# Patient Record
Sex: Female | Born: 1970 | Race: White | Hispanic: No | Smoking: Current every day smoker
Health system: Southern US, Community
[De-identification: ages and names within clinical notes are randomized; demographics above are authoritative.]

## PROBLEM LIST (undated history)

## (undated) DIAGNOSIS — I10 Essential (primary) hypertension: Secondary | ICD-10-CM

## (undated) DIAGNOSIS — F1910 Other psychoactive substance abuse, uncomplicated: Secondary | ICD-10-CM

## (undated) DIAGNOSIS — K829 Disease of gallbladder, unspecified: Secondary | ICD-10-CM

## (undated) DIAGNOSIS — R350 Frequency of micturition: Principal | ICD-10-CM

## (undated) DIAGNOSIS — Z720 Tobacco use: Secondary | ICD-10-CM

## (undated) DIAGNOSIS — M549 Dorsalgia, unspecified: Secondary | ICD-10-CM

## (undated) DIAGNOSIS — F419 Anxiety disorder, unspecified: Secondary | ICD-10-CM

## (undated) DIAGNOSIS — D649 Anemia, unspecified: Secondary | ICD-10-CM

## (undated) DIAGNOSIS — K219 Gastro-esophageal reflux disease without esophagitis: Secondary | ICD-10-CM

## (undated) DIAGNOSIS — N390 Urinary tract infection, site not specified: Secondary | ICD-10-CM

## (undated) DIAGNOSIS — R319 Hematuria, unspecified: Secondary | ICD-10-CM

## (undated) DIAGNOSIS — F99 Mental disorder, not otherwise specified: Secondary | ICD-10-CM

## (undated) DIAGNOSIS — D071 Carcinoma in situ of vulva: Secondary | ICD-10-CM

## (undated) DIAGNOSIS — B1920 Unspecified viral hepatitis C without hepatic coma: Secondary | ICD-10-CM

## (undated) HISTORY — DX: Anemia, unspecified: D64.9

## (undated) HISTORY — DX: Urinary tract infection, site not specified: N39.0

## (undated) HISTORY — DX: Mental disorder, not otherwise specified: F99

## (undated) HISTORY — DX: Tobacco use: Z72.0

## (undated) HISTORY — DX: Hematuria, unspecified: R31.9

## (undated) HISTORY — DX: Carcinoma in situ of vulva: D07.1

## (undated) HISTORY — DX: Other psychoactive substance abuse, uncomplicated: F19.10

## (undated) HISTORY — DX: Gastro-esophageal reflux disease without esophagitis: K21.9

## (undated) HISTORY — DX: Frequency of micturition: R35.0

## (undated) HISTORY — DX: Anxiety disorder, unspecified: F41.9

## (undated) HISTORY — DX: Disease of gallbladder, unspecified: K82.9

## (undated) HISTORY — PX: TUBAL LIGATION: SHX77

---

## 2001-04-23 ENCOUNTER — Inpatient Hospital Stay (HOSPITAL_COMMUNITY): Admission: AD | Admit: 2001-04-23 | Discharge: 2001-04-24 | Payer: Self-pay | Admitting: Obstetrics and Gynecology

## 2002-11-26 ENCOUNTER — Emergency Department (HOSPITAL_COMMUNITY): Admission: EM | Admit: 2002-11-26 | Discharge: 2002-11-26 | Payer: Self-pay | Admitting: Emergency Medicine

## 2003-06-06 ENCOUNTER — Emergency Department (HOSPITAL_COMMUNITY): Admission: EM | Admit: 2003-06-06 | Discharge: 2003-06-06 | Payer: Self-pay | Admitting: *Deleted

## 2004-05-19 ENCOUNTER — Emergency Department (HOSPITAL_COMMUNITY): Admission: EM | Admit: 2004-05-19 | Discharge: 2004-05-19 | Payer: Self-pay | Admitting: Emergency Medicine

## 2004-05-23 ENCOUNTER — Ambulatory Visit: Payer: Self-pay | Admitting: Family Medicine

## 2004-07-10 ENCOUNTER — Emergency Department (HOSPITAL_COMMUNITY): Admission: EM | Admit: 2004-07-10 | Discharge: 2004-07-10 | Payer: Self-pay | Admitting: Emergency Medicine

## 2004-07-13 ENCOUNTER — Ambulatory Visit: Payer: Self-pay | Admitting: Family Medicine

## 2005-09-05 ENCOUNTER — Ambulatory Visit (HOSPITAL_COMMUNITY): Admission: RE | Admit: 2005-09-05 | Discharge: 2005-09-05 | Payer: Self-pay | Admitting: Family Medicine

## 2005-09-13 ENCOUNTER — Ambulatory Visit (HOSPITAL_COMMUNITY): Admission: RE | Admit: 2005-09-13 | Discharge: 2005-09-13 | Payer: Self-pay | Admitting: Family Medicine

## 2006-09-15 ENCOUNTER — Emergency Department (HOSPITAL_COMMUNITY): Admission: EM | Admit: 2006-09-15 | Discharge: 2006-09-15 | Payer: Self-pay | Admitting: Emergency Medicine

## 2006-10-10 ENCOUNTER — Encounter: Payer: Self-pay | Admitting: Obstetrics & Gynecology

## 2006-10-10 ENCOUNTER — Ambulatory Visit (HOSPITAL_COMMUNITY): Admission: RE | Admit: 2006-10-10 | Discharge: 2006-10-10 | Payer: Self-pay | Admitting: Obstetrics & Gynecology

## 2006-12-25 ENCOUNTER — Emergency Department (HOSPITAL_COMMUNITY): Admission: EM | Admit: 2006-12-25 | Discharge: 2006-12-25 | Payer: Self-pay | Admitting: Emergency Medicine

## 2007-01-28 ENCOUNTER — Emergency Department (HOSPITAL_COMMUNITY): Admission: EM | Admit: 2007-01-28 | Discharge: 2007-01-28 | Payer: Self-pay | Admitting: Emergency Medicine

## 2007-02-20 ENCOUNTER — Emergency Department (HOSPITAL_COMMUNITY): Admission: EM | Admit: 2007-02-20 | Discharge: 2007-02-20 | Payer: Self-pay | Admitting: Emergency Medicine

## 2007-11-28 ENCOUNTER — Emergency Department (HOSPITAL_COMMUNITY): Admission: EM | Admit: 2007-11-28 | Discharge: 2007-11-28 | Payer: Self-pay | Admitting: Emergency Medicine

## 2007-12-03 ENCOUNTER — Other Ambulatory Visit: Admission: RE | Admit: 2007-12-03 | Discharge: 2007-12-03 | Payer: Self-pay | Admitting: Obstetrics & Gynecology

## 2007-12-09 ENCOUNTER — Emergency Department (HOSPITAL_COMMUNITY): Admission: EM | Admit: 2007-12-09 | Discharge: 2007-12-09 | Payer: Self-pay | Admitting: Emergency Medicine

## 2008-05-27 ENCOUNTER — Inpatient Hospital Stay (HOSPITAL_COMMUNITY): Admission: RE | Admit: 2008-05-27 | Discharge: 2008-05-30 | Payer: Self-pay | Admitting: Obstetrics & Gynecology

## 2008-05-27 ENCOUNTER — Encounter: Payer: Self-pay | Admitting: Obstetrics & Gynecology

## 2008-06-12 ENCOUNTER — Emergency Department (HOSPITAL_COMMUNITY): Admission: EM | Admit: 2008-06-12 | Discharge: 2008-06-12 | Payer: Self-pay | Admitting: Emergency Medicine

## 2008-06-18 ENCOUNTER — Inpatient Hospital Stay (HOSPITAL_COMMUNITY): Admission: EM | Admit: 2008-06-18 | Discharge: 2008-06-23 | Payer: Self-pay | Admitting: Emergency Medicine

## 2008-06-22 ENCOUNTER — Encounter (INDEPENDENT_AMBULATORY_CARE_PROVIDER_SITE_OTHER): Payer: Self-pay | Admitting: General Surgery

## 2008-10-01 ENCOUNTER — Emergency Department (HOSPITAL_COMMUNITY): Admission: EM | Admit: 2008-10-01 | Discharge: 2008-10-01 | Payer: Self-pay | Admitting: Emergency Medicine

## 2009-03-01 ENCOUNTER — Emergency Department (HOSPITAL_COMMUNITY): Admission: EM | Admit: 2009-03-01 | Discharge: 2009-03-01 | Payer: Self-pay | Admitting: Emergency Medicine

## 2010-05-10 LAB — BASIC METABOLIC PANEL
BUN: 11 mg/dL (ref 6–23)
BUN: 11 mg/dL (ref 6–23)
CO2: 29 mEq/L (ref 19–32)
Calcium: 8.4 mg/dL (ref 8.4–10.5)
Chloride: 103 mEq/L (ref 96–112)
Chloride: 104 mEq/L (ref 96–112)
Creatinine, Ser: 0.59 mg/dL (ref 0.4–1.2)
Creatinine, Ser: 0.69 mg/dL (ref 0.4–1.2)
GFR calc Af Amer: >60 mL/min (ref >60)
GFR calc non Af Amer: >60 mL/min (ref >60)
GFR calc non Af Amer: >60 mL/min (ref >60)
GFR calc non Af Amer: >60 mL/min (ref >60)
Glucose, Bld: 107 mg/dL — ABNORMAL HIGH (ref 70–99)
Glucose, Bld: 85 mg/dL (ref 70–99)
Potassium: 4.8 mEq/L (ref 3.5–5.1)
Potassium: 4.9 mEq/L (ref 3.5–5.1)
Sodium: 137 mEq/L (ref 135–145)
Sodium: 139 mEq/L (ref 135–145)

## 2010-05-10 LAB — URINALYSIS, ROUTINE W REFLEX MICROSCOPIC
Bilirubin Urine: NEGATIVE
Glucose, UA: NEGATIVE mg/dL
Ketones, ur: >80 mg/dL — AB
Ketones, ur: NEGATIVE mg/dL
Leukocytes, UA: NEGATIVE
Nitrite: NEGATIVE
Protein, ur: NEGATIVE mg/dL
Protein, ur: >300 mg/dL — AB
Urobilinogen, UA: 0.2 mg/dL (ref 0.0–1.0)

## 2010-05-10 LAB — CBC
HCT: 28.4 % — ABNORMAL LOW (ref 36.0–46.0)
HCT: 30.8 % — ABNORMAL LOW (ref 36.0–46.0)
HCT: 33.5 % — ABNORMAL LOW (ref 36.0–46.0)
Hemoglobin: 10.2 g/dL — ABNORMAL LOW (ref 12.0–15.0)
Hemoglobin: 10.6 g/dL — ABNORMAL LOW (ref 12.0–15.0)
Hemoglobin: 12 g/dL (ref 12.0–15.0)
MCHC: 35.2 g/dL (ref 30.0–36.0)
MCV: 89.7 fL (ref 78.0–100.0)
MCV: 89.8 fL (ref 78.0–100.0)
Platelets: 239 10*3/uL (ref 150–400)
RBC: 3.16 MIL/uL — ABNORMAL LOW (ref 3.87–5.11)
RBC: 3.35 MIL/uL — ABNORMAL LOW (ref 3.87–5.11)
RDW: 14.6 % (ref 11.5–15.5)
RDW: 14.6 % (ref 11.5–15.5)
RDW: 15.2 % (ref 11.5–15.5)
WBC: 6.9 10*3/uL (ref 4.0–10.5)
WBC: 7.9 10*3/uL (ref 4.0–10.5)
WBC: 8.2 10*3/uL (ref 4.0–10.5)

## 2010-05-10 LAB — DIFFERENTIAL
Basophils Absolute: 0 10*3/uL (ref 0.0–0.1)
Basophils Absolute: 0 10*3/uL (ref 0.0–0.1)
Basophils Absolute: 0.1 10*3/uL (ref 0.0–0.1)
Basophils Absolute: 0.1 10*3/uL (ref 0.0–0.1)
Basophils Relative: 1 % (ref 0–1)
Basophils Relative: 1 % (ref 0–1)
Eosinophils Absolute: 0.3 10*3/uL (ref 0.0–0.7)
Eosinophils Absolute: 0.3 10*3/uL (ref 0.0–0.7)
Eosinophils Relative: 2 % (ref 0–5)
Eosinophils Relative: 4 % (ref 0–5)
Lymphocytes Relative: 23 % (ref 12–46)
Lymphs Abs: 1.8 10*3/uL (ref 0.7–4.0)
Lymphs Abs: 2.4 10*3/uL (ref 0.7–4.0)
Lymphs Abs: 2.4 10*3/uL (ref 0.7–4.0)
Monocytes Absolute: 1 10*3/uL (ref 0.1–1.0)
Monocytes Relative: 9 % (ref 3–12)
Neutro Abs: 4 10*3/uL (ref 1.7–7.7)
Neutro Abs: 4.7 10*3/uL (ref 1.7–7.7)
Neutrophils Relative %: 53 % (ref 43–77)
Neutrophils Relative %: 61 % (ref 43–77)
Neutrophils Relative %: 68 % (ref 43–77)

## 2010-05-10 LAB — COMPREHENSIVE METABOLIC PANEL
Alkaline Phosphatase: 660 U/L — ABNORMAL HIGH (ref 39–117)
BUN: 8 mg/dL (ref 6–23)
CO2: 25 mEq/L (ref 19–32)
Chloride: 99 mEq/L (ref 96–112)
Glucose, Bld: 102 mg/dL — ABNORMAL HIGH (ref 70–99)
Potassium: 3 mEq/L — ABNORMAL LOW (ref 3.5–5.1)
Total Bilirubin: 4.4 mg/dL — ABNORMAL HIGH (ref 0.3–1.2)

## 2010-05-10 LAB — HEPATIC FUNCTION PANEL
ALT: 81 U/L — ABNORMAL HIGH (ref 0–35)
AST: 20 U/L (ref 0–37)
Albumin: 2 g/dL — ABNORMAL LOW (ref 3.5–5.2)
Albumin: 2.2 g/dL — ABNORMAL LOW (ref 3.5–5.2)
Alkaline Phosphatase: 307 U/L — ABNORMAL HIGH (ref 39–117)
Alkaline Phosphatase: 392 U/L — ABNORMAL HIGH (ref 39–117)
Alkaline Phosphatase: 424 U/L — ABNORMAL HIGH (ref 39–117)
Bilirubin, Direct: 0.4 mg/dL — ABNORMAL HIGH (ref 0.0–0.3)
Bilirubin, Direct: 0.4 mg/dL — ABNORMAL HIGH (ref 0.0–0.3)
Indirect Bilirubin: 0.5 mg/dL (ref 0.3–0.9)
Indirect Bilirubin: 0.5 mg/dL (ref 0.3–0.9)
Total Bilirubin: 0.9 mg/dL (ref 0.3–1.2)
Total Bilirubin: 1 mg/dL (ref 0.3–1.2)
Total Protein: 5 g/dL — ABNORMAL LOW (ref 6.0–8.3)
Total Protein: 5.1 g/dL — ABNORMAL LOW (ref 6.0–8.3)

## 2010-05-10 LAB — AMYLASE: Amylase: 77 U/L (ref 27–131)

## 2010-05-10 LAB — CROSSMATCH: ABO/RH(D): O POS

## 2010-05-10 LAB — PREGNANCY, URINE: Preg Test, Ur: NEGATIVE

## 2010-05-10 LAB — URINE MICROSCOPIC-ADD ON

## 2010-05-10 LAB — LIPASE, BLOOD: Lipase: 28 U/L (ref 11–59)

## 2010-05-11 LAB — CBC
HCT: 28.1 % — ABNORMAL LOW (ref 36.0–46.0)
HCT: 33.5 % — ABNORMAL LOW (ref 36.0–46.0)
Hemoglobin: 10 g/dL — ABNORMAL LOW (ref 12.0–15.0)
Hemoglobin: 11.9 g/dL — ABNORMAL LOW (ref 12.0–15.0)
MCHC: 35.3 g/dL (ref 30.0–36.0)
MCHC: 35.5 g/dL (ref 30.0–36.0)
RBC: 2.95 MIL/uL — ABNORMAL LOW (ref 3.87–5.11)
RBC: 3.51 MIL/uL — ABNORMAL LOW (ref 3.87–5.11)
RDW: 13.9 % (ref 11.5–15.5)
RDW: 14.1 % (ref 11.5–15.5)

## 2010-06-14 NOTE — Op Note (Signed)
NAME:  Jamie Evans, Jamie Evans               ACCOUNT NO.:  [id number]   MEDICAL RECORD NO.:  [id number]          PATIENT TYPE:  INP   LOCATION:  A308                          FACILITY:  [facility]   PHYSICIAN:  Dalia Heading, M.D.  DATE OF BIRTH:  03/11/1970   DATE OF PROCEDURE:  06/22/2008  DATE OF DISCHARGE:                               OPERATIVE REPORT   PREOPERATIVE DIAGNOSES:  Cholecystitis and cholelithiasis.   POSTOPERATIVE DIAGNOSES:  Cholecystitis and cholelithiasis.   PROCEDURE:  Laparoscopic cholecystectomy.   SURGEON:  Dalia Heading, MD   ANESTHESIA:  General endotracheal.   INDICATIONS:  The patient is a 40 year old white female who presents  with cholecystitis secondary to cholelithiasis.  She is postpartum 3  weeks.  She was hypertensive throughout her pregnancy.  She did have  hyperbilirubinemia when she first presented, but this has since  resolved.  The patient now comes to the operating room for laparoscopic  cholecystectomy with a possible cholangiogram.  The risks and benefits  of the procedure including bleeding, infection, hepatobiliary injury,  and the possibility of an open procedure were fully explained to the  patient, gave informed consent.   PROCEDURE NOTE:  The patient was placed in the supine position.  After  induction of general endotracheal anesthesia, the abdomen was prepped  and draped using the usual sterile technique with Betadine.  Surgical  site confirmation was performed.   A supraumbilical incision was made down to the fascia.  Veress needle  was introduced into the abdominal cavity, and confirmation of placement  was done using the saline drop test.  The abdomen was then insufflated  to 16 mmHg pressure.  An 11-mm trocar was introduced into the abdominal  cavity under direct visualization without difficulty.  The patient was  placed in reverse Trendelenburg position.  An additional 1-mm trocar was  placed in the epigastric region.  A 5-mm  trocar was placed in the right  upper quadrant and right flank regions.  Liver was inspected and noted  to be within normal limits.  The gallbladder was retracted superior  laterally.  Dissection was begun around the infundibulum of the  gallbladder.  The cystic duct was first identified.  Its juncture to the  infundibulum fully identified.  A single EndoClip was placed proximally  on the cystic duct.  An incision was made into the cystic duct, and a  cholangiogram was attempted.  I was then able to thread the  cholangiocatheter into the cystic duct due to the cystic duct's small  caliber.  It was decided to abort the cholangiogram.  Multiple EndoClips  were placed distally on the cystic duct, and the cystic duct was  divided.  Cystic artery was likewise ligated and divided.  The  gallbladder was freed away from the gallbladder fossa using Bovie  electrocautery.  The gallbladder was delivered through the epigastric  trocar site using EndoCatch bag.  The gallbladder fossa was inspected.  No abnormal bleeding or bile leakage was noted.  Surgicel was placed in  the gallbladder fossa.  All fluid and air were then evacuated from the  abdominal cavity prior to removal of the trocars.   All wounds were irrigated with normal saline.  All wounds were injected  with 0.5% Sensorcaine.  The supraumbilical fascia as well as epigastric  fascia were reapproximated using 0 Vicryl interrupted sutures.  All skin  incisions were closed using staples.  Betadine ointment and dry sterile  dressings were applied.   All tape and needle counts were correct at the end of the procedure.  The patient was extubated in the operating room, went back to recovery  room awake in stable condition.   COMPLICATIONS:  None.   SPECIMEN:  Gallbladder.   BLOOD LOSS:  Minimal.      Dalia Heading, M.D.  Electronically Signed     MAJ/MEDQ  D:  06/22/2008  T:  06/23/2008  Job:  [id number]   cc:   Lia Hopping  Fax:  [phone]   Lazaro Arms, M.D.  Fax: [phone]

## 2010-06-14 NOTE — H&P (Signed)
NAME:  Jamie Evans, Jamie Evans               ACCOUNT NO.:  [id number]   MEDICAL RECORD NO.:  [id number]          PATIENT TYPE:  INP   LOCATION:  A228                          FACILITY:  [facility]   PHYSICIAN:  Dalia Heading, M.D.  DATE OF BIRTH:  07-02-1970   DATE OF ADMISSION:  06/17/2008  DATE OF DISCHARGE:  LH                              HISTORY & PHYSICAL   CHIEF COMPLAINT:  Right upper quadrant abdominal pain.   HISTORY OF PRESENT ILLNESS:  The patient is a 40 year old white female  who presented the emergency room with worsening right upper quadrant  abdominal pain.  She is status post a C-section 3 weeks ago by Dr. Despina Hidden.  She was afebrile, but her blood pressure was significantly elevated at  184/118.  Pulse was 112, respirations 26.  She underwent CT scan of the  abdomen and pelvis which revealed acute cholecystitis.  Her liver enzyme  tests were markedly elevated.   PAST MEDICAL HISTORY:  Includes hypertension, temporomandibular joint  disease.   SURGICAL HISTORY:  C-sections, dilatation and curettage, tubal ligation.   CURRENT MEDICATIONS:  Tylox and Xanax.   ALLERGIES:  PENICILLIN.   REVIEW OF SYSTEMS:  The patient does smoke.  She denies any illicit drug  abuse.  She socially drinks.   PHYSICAL EXAMINATION:  On physical examination, the patient is a well-  developed, well-nourished white female who was in moderate discomfort.  LUNGS:  Clear to auscultation with equal breath sounds bilaterally.  HEART EXAMINATION:  Reveals a regular rate and rhythm without S3, S4, or  murmurs.  ABDOMEN:  Is soft with tenderness noted in the right upper quadrant to  palpation.  No rigidity is noted.  No hepatosplenomegaly or masses are  noted.   White blood cell count 6.9, hemoglobin 12, hematocrit 33.5, platelet  count 246.  MET 7 is remarkable for a potassium of 3.  SGOT 326, SGPT  590, alkaline phosphatase 660, total bilirubin 4.4, lipase 13, albumin  2.7.  Calcium is 8.9.   IMPRESSION:   Cholecystitis, cholelithiasis, jaundice.   PLAN:  The patient is being admitted to hospital for pain control.  An  ultrasound of the gallbladder will be performed to further delineate the  gallbladder.  Further managements pending those results.  She will  subsequently need a cholecystectomy prior to discharge.  This has been  explained to the patient, and she agrees to the treatment plan.  Gastroenterology will be consulted if there is evidence of  choledocholithiasis.      Dalia Heading, M.D.  Electronically Signed     MAJ/MEDQ  D:  06/18/2008  T:  06/18/2008  Job:  [id number]   cc:   Dalia Heading, M.D.  Fax: [phone]   Lia Hopping  Fax: [phone]

## 2010-06-14 NOTE — Op Note (Signed)
NAME:  Jamie Evans, Jamie Evans               ACCOUNT NO.:  [id number]   MEDICAL RECORD NO.:  [id number]          PATIENT TYPE:  AMB   LOCATION:  DAY                           FACILITY:  [facility]   PHYSICIAN:  Lazaro Arms, M.D.   DATE OF BIRTH:  1970-09-23   DATE OF PROCEDURE:  10/10/2006  DATE OF DISCHARGE:                               OPERATIVE REPORT   PREOPERATIVE DIAGNOSIS:  Missed abortion at 9 weeks.   POSTOPERATIVE DIAGNOSIS:  Missed abortion at 9 weeks.   PROCEDURE:  Cervical dilation with suction and sharp uterine curettage.   SURGEON:  Duane Lope, MD.   ANESTHESIA:  General endotracheal anesthesia.   FINDINGS:  The patient seen in the office. Had about a 9-week  intrauterine pregnancy with no fetal cardiac activity. She desired to  have a D and C.  On exam today her uterus sounded to 10 cm and there was  appropriate amount of products of conception return.   DESCRIPTION OF OPERATION:  The patient was taken to the operating room  and placed in the supine position. Underwent general endotracheal  anesthesia, then prepped and draped in the usual sterile fashion. Her  cervix was grasped. Cervix dilated serially to allow passage of the 9  curved French suction curet. Several passes were made. A moderate of  tissue was returned. A general sharp curettage was performed without  difficulty. Had good uterine cry in all areas.   The patient had about 100 mL of blood loss. She received Methergine 0.2  mg IM, and was taken to the recovery room in good and stable condition;  all counts correct.      Lazaro Arms, M.D.  Electronically Signed     LHE/MEDQ  D:  10/10/2006  T:  10/10/2006  Job:  [id number]

## 2010-06-14 NOTE — Discharge Summary (Signed)
NAMEKELSEY, DURFLINGER               ACCOUNT NO.:  [id number]   MEDICAL RECORD NO.:  [id number]          PATIENT TYPE:  INP   LOCATION:  9124                          FACILITY:  [facility]   PHYSICIAN:  Tanya S. Shawnie Pons, M.D.   DATE OF BIRTH:  [birth year]   DATE OF ADMISSION:  05/27/2008  DATE OF DISCHARGE:  05/30/2008                               DISCHARGE SUMMARY   PREOPERATIVE DIAGNOSES:  1. Intrauterine pregnancy at 53 weeks' gestation.  2. Previous cesarean section x3.  3. Desires permanent sterilization.  4. Cocaine abuse, which the patient denies.   POSTOPERATIVE DIAGNOSES:  1. Intrauterine pregnancy at 54 weeks' gestation.  2. Previous cesarean section x3.  3. Desires permanent sterilization.  4. Cocaine abuse, which the patient denies.   PROCEDURE:  Repeat cesarean section low transverse and bilateral tubal  sterilization.   The surgeon who performed this repeat cesarean section was Dr. Lazaro Arms.  Please see the dictated operative note for details of the surgery  and did produce a viable female infant with Apgars of 9 and 9 and EBL  for the surgery of 500 mL.  The patient's admitting hemoglobin was 11.9,  hematocrit 33.5, white blood cell count 10.5, and platelets 249.  The  patient's postoperative and postpartum course had been uneventful.  On  postpartum day #1, the patient's hemoglobin was found to be 10.0,  hematocrit 28.1, white blood count 12.7, and platelets 178.  The urine  drug screen was performed secondary to the patient's history of multiple  positive urine drug screen during pregnancy, it was found to be  negative.  Meconium screen of the infant was also found to be negative.  Throughout the course of her hospital stay, the patient's vital signs  have been within normal limits.  Her exams have been within normal  limits.  The patient is being discharged on postoperative day #3 with no  complaints.  She is being discharged home in stable condition.  Social  work, CPS is to follow, and CPS will follow up on Monday secondary to  history of cocaine abuse and a positive drug screen that were found  during her pregnancy.  The patient is to follow up on Wednesday at  [facility] for staple removal and at 6 weeks for her postpartum visit.   Discharge prescriptions were given for Motrin 600 mg q.6 h. p.r.n.  cramping and Percocet 5/325 one-two tablets p.o. q.4-6 hours p.r.n.  pain.  The patient is instructed to continue her prenatal vitamins 1  p.o. daily for 6 weeks  postpartum.  Engorgement precautions had been reviewed with her  secondary to her preference of bottle feeding.  The patient is  encouraged to continue her nicotine patches and is instructed on how to  purchase over-the-counter patches and the strength needed for smoking  cessation.      Maylon Cos, C.N.M.      Shelbie Proctor. Shawnie Pons, M.D.  Electronically Signed    SS/MEDQ  D:  05/30/2008  T:  05/31/2008  Job:  [id number]

## 2010-06-14 NOTE — Op Note (Signed)
Jamie Evans, Jamie Evans               ACCOUNT NO.:  [id number]   MEDICAL RECORD NO.:  [id number]          PATIENT TYPE:  INP   LOCATION:  9124                          FACILITY:  [facility]   PHYSICIAN:  Lazaro Arms, M.D.   DATE OF BIRTH:  03-04-1970   DATE OF PROCEDURE:  05/27/2008  DATE OF DISCHARGE:                               OPERATIVE REPORT   PREOPERATIVE DIAGNOSES:  1. Intrauterine pregnancy at 39 weeks' gestation.  2. Previous cesarean section x3.  3. Desires sterilization.  4. History of four consecutive positive cocaine in urine, the patient      denies use.   POSTOPERATIVE DIAGNOSES:  1. Intrauterine pregnancy at 82 weeks' gestation.  2. Previous cesarean section x3.  3. Desires sterilization.  4. History of four consecutive positive cocaine in urine, the patient      denies use.   PROCEDURE:  Repeat cesarean section and tubal ligation.   SURGEON:  Lazaro Arms, MD   ANESTHESIA:  Spinal.   FINDINGS:  With low-transverse hysterotomy incision was delivered a  viable female at 75 with Apgars of 9 and 9, weighing 5 pounds 14  ounces.  There was three-vessel cord.  Cord blood and cord gases were  sent.  Placenta was normal, delivered spontaneously.   DESCRIPTION OF OPERATION:  The patient was taken to the operating room,  placed in the sitting position, underwent a spinal anesthetic.  She was  prepped and draped in the usual sterile fashion.  A Pfannenstiel skin  incision was made, carried down sharply to the rectus fascia, scored in  the midline and extended laterally.  The fascia was taken off the muscle  superiorly and inferiorly without difficulty.  The muscles were divided  and the peritoneal cavity was entered.  A bladder blade was placed.  The  vesicouterine serosal flap was created.  Low-transverse hysterotomy  incision was made over this incision and delivered a viable female  infant at 32 with Apgars of 9 and 9, weighing 5 pounds and 14 ounces.  Three-vessel cord.  Cord blood and cord gases were sent.  Placenta was  normal and intact.  The uterus was closed in 2 layers, first being the  running interlocking layer and the second being the imbricating layer.  Modified Pomeroy bilateral tubal ligation was performed.  Two segments  were removed bilaterally.  Interrupted figure-of-eights were placed for  additional hemostasis.  Uterus was replaced in peritoneal cavity.  Both  paracolic gutters were irrigated and pedicle was found to be hemostatic.  The muscles and peritoneum were  reapproximated loosely.  The fascia was closed using 0 Vicryl running.  Subcutaneous tissues were made hemostatic and irrigated.  The skin was  closed using skin staples.  The patient tolerated the procedure well.  She experienced 500 mL of blood loss and taken to the recovery room in  good and stable condition.  All counts were correct x3.      Lazaro Arms, M.D.  Electronically Signed     LHE/MEDQ  D:  05/27/2008  T:  05/28/2008  Job:  [id number]

## 2010-06-14 NOTE — Discharge Summary (Signed)
NAME:  Jamie Evans, Jamie Evans NO.:  [id number]   MEDICAL RECORD NO.:  [id number]          PATIENT TYPE:  INP   LOCATION:  A308                          FACILITY:  [facility]   PHYSICIAN:  Dalia Heading, M.D.  DATE OF BIRTH:  May 31, 1970   DATE OF ADMISSION:  06/17/2008  DATE OF DISCHARGE:  05/25/2010LH                               DISCHARGE SUMMARY   HOSPITAL COURSE SUMMARY:  The patient is a 40 year old white female who  is 3 weeks postpartum, who presented to [facility] with  abdominal pain, nausea, and vomiting.  She was found to have acute  cholecystitis.  She was admitted for further management and treatment.  On admission, her SGOT was 326, SGPT 590, alkaline phosphatase 660, and  total bilirubin 4.4.  Her liver enzyme tests subsequently improved.  She  did have an ultrasound of the gallbladder revealed cholelithiasis and  cholecystitis.  The common bile duct was 8-mm, but no  choledocholithiasis was seen.  She was also noted to have significant  hypertension, which was treated preoperatively.  She subsequently went  to the operating room on Jun 22, 2008, and underwent laparoscopic  cholecystectomy.  I attempted a cholangiogram, but the cystic duct was  very narrow.  Her total bilirubin had returned to normal prior to  surgery.  She tolerated the surgery well.  Postoperative course has been  unremarkable.  Diet was advanced without difficulty.   The patient is being discharged home on Jun 23, 2008, in good improving  condition.   DISCHARGE INSTRUCTIONS:  The patient is to follow up with Dr. Franky Macho on June 30, 2008 and Dr. Olena Leatherwood in 2 weeks.   DISCHARGE MEDICATIONS:  1. Percocet 7.5/325 mg 1-2 tablets p.o. q.4 h. p.r.n. pain.  2. Clonidine 0.2 mg p.o. b.i.d.  3. Metoprolol 25 mg p.o. b.i.d.   PRINCIPAL DIAGNOSES:  1. Cholecystitis, cholelithiasis.  2. Hypertension.   PRINCIPAL PROCEDURE:  Laparoscopic cholecystectomy on Jun 22, 2008.      Dalia Heading, M.D.  Electronically Signed     MAJ/MEDQ  D:  06/23/2008  T:  06/23/2008  Job:  [id number]   cc:   Lazaro Arms, M.D.  Fax: [phone]   Lia Hopping  Fax: [phone]

## 2010-06-17 NOTE — H&P (Signed)
[facility]  Patient:    Jamie, Jamie Evans Visit Number: [id number] MRN: [id number]          Service Type: OBS Location: LDR [id number] 01 Attending Physician:  Tilda Burrow Dictated by:   Duane Lope, M.D. Admit Date:  04/23/2001 Discharge Date: 04/24/2001                           History and Physical  DATE OF BIRTH: [birth year].  SOCIAL SECURITY NUMBER:  [id number].  HISTORY OF PRESENT ILLNESS:  The patient is a 40 year old white female, gravida 4, para 3, abortus 0, with an estimated date of delivery by a sonogram done two weeks ago of September 20, 2001, given her an estimated gestational age of 18-2/7th weeks gestation.  At the patients initial visit, which was on April 08, 2001 and the day of her sonogram, she was measuring two weeks smaller than her last menstrual period date and had laboratory data drawn that day.  She was found to have a positive drug screen for cocaine, marijuana and benzodiazepines and had a MSAFP which was significantly elevated.  Attempts were made to contact the patient to bring her back in for her elevated AFP and we got her back into the office on April 22, 2001; at which time, a sonogram revealed a nonviable fetus found initially by Zerita Boers, N.M. who asked Cyril Mourning to come who was the ultrasonographer as well as myself and we both confirmed that there was no cardiac activity.  In addition, there is diminished fluid, some elongation and probably overlapping of the sutures of the cranium as well which would point that the fetus became nonviable some time ago, at least a week.  The patient is informed of this and she is admitted to the hospital for the management of a fetal demise at [redacted] weeks gestation.  She is admitted for prostaglandin induction of labor and placement of laminaria.  PAST MEDICAL HISTORY:  Significant for drug abuse.  PAST SURGICAL HISTORY:  Three cesarean sections.  PAST OBSTETRICAL  HISTORY:  Three cesarean section in 1992, 1993, and 1996.  ALLERGIES:  None.  MEDICATIONS:  Prenatal vitamins and iron.  SOCIAL HISTORY:  The patient is single and unemployed.  FAMILY HISTORY:  Significant for heart problems and hypertension.  LABORATORY DATA:  Besides the significant risk for open neural tube defect with maternal serum alphafetoprotein, there is of course a positive urine drug screen.  She has a blood type of O positive.  Rubella is immuned.  RPR is nonreactive.  HIV is negative.  Pap smear had not yet been performed.  PHYSICAL EXAMINATION:  VITAL SIGNS:  Her weight is 153 pounds and her blood pressure is 110/70.  NECK:  Unremarkable.  Normal thyroid.  LUNGS:  Clear.  HEART:  Regular rate and rhythm with no murmur, regurgitation, or gallop.  BREASTS:  Deferred.  ABDOMEN:  Benign.  PELVIC:  Uterus is palpated three fingerbreadths under the umbilicus.  Pelvic exam was not performed.  EXTREMITIES:  Warm.  No edema.  NEUROLOGICAL:  Grossly intact.  IMPRESSION: 1. Intrauterine pregnancy at 18-2/7th weeks gestation by 16-week sonogram. 2. Fetal demise, probably for at least a week. 3. Elevated maternal serum alphafetoprotein. 4. Drug abuse.  PLAN:  While the patient denies any recent history of drug abuse she says at least one month, we will check a urine drug screen.  Effect delivery with a laminaria and prostaglandin.  Pathology will be performed on the fetus and hopefully chromosomal analysis as well.  The patient understands the indications for all of this.  She understands the clinical situation and we will proceed with laminaria and prostaglandin on April 23, 2001. Dictated by:   Duane Lope, M.D. Attending Physician:  Tilda Burrow DD:  04/22/01 TD:  04/22/01 Job: [id number]

## 2010-06-17 NOTE — Discharge Summary (Signed)
[facility]  Patient:    Jamie Evans, Jamie Evans Visit Number: [id number] MRN: [id number]          Service Type: OBS Location: LDR [id number] 01 Attending Physician:  Tilda Burrow Dictated by:   Christin Bach, M.D. Admit Date:  04/23/2001 Discharge Date: 04/24/2001                             Discharge Summary  ADMITTING DIAGNOSES: 1. Intrauterine pregnancy 18 weeks 2 days. 2. Fetal demise in utero. 3. History of elevated maternal serum alpha fetoprotein (MSAFP). 2. History of drug abuse this pregnancy.  DISCHARGE DIAGNOSES: 1. Intrauterine pregnancy 18 weeks. 2. Fetal demise in utero.  PROCEDURE: 1. Prostaglandin induction of labor on April 23, 2001 2. April 24, 2001, delivery of stillborn fetus and placenta.  HISTORY OF PRESENT ILLNESS:   This 40 year old female gravida 4, para 3, AB 0 at [redacted] weeks gestation after a pregnancy followed briefly in our office after initial visit April 08, 2001.  She was found to have size less than dates.  By corrected EDC she was 18-1/2 weeks at the time of admission.  She was seen for regular visits and found to have absence of fetal heart motion on examination April 22, 2001.  The patient was admitted on April 23, 2001, for delivery of stillborn fetus.  Since the patient is less than [redacted] weeks gestation it is considered a secondary trimester miscarriage rather than a stillborn.  HOSPITAL COURSE:  The patient was admitted as described in the admitting history by Dr. Despina Hidden from April 22, 2001.  The patient had laminaria inserted at 9 a.m. on April 23, 2001.  They were left in until 2 p.m. on which they were removed having dilated cervix nicely.  Prostin, prostaglandin F2 alpha 20 mg vaginal suppositories were placed per vagina every 3 hours until 11 p.m. Contractions developed and she proceeded to pass the stillborn fetus at 2:15 a.m.  The placenta was retained for approximately 5 hours and then delivered intact  spontaneously at 7:10 a.m.  Blood type was 0 positive.  The patient chose to go home on the afternoon of April 24, 2001, for follow up 1 week later in our office.  The patient showed appropriate breathing with regards to the pregnancy loss but did not wish to see the fetus after delivery. Additionally the patients urine drug screen was negative.  Follow up will be in our office in 1 to 2 weeks. Dictated by:   Christin Bach, M.D. Attending Physician:  Tilda Burrow DD:  05/22/01 TD:  05/22/01 Job: [id number]

## 2010-06-17 NOTE — Op Note (Signed)
[facility]  Patient:    Jamie Evans, Jamie Evans Visit Number: [id number] MRN: [id number]          Service Type: OBS Location: LDR HYQ6 01 Attending Physician:  Tilda Burrow Dictated by:   Christin Bach, M.D. Admit Date:  04/23/2001 Discharge Date: 04/24/2001                             Operative Report                         DELIVERY NOTE  DELIVERY TIME:  2:15 a.m. of fetus.  Delivery of placenta 7:10 a.m.  PHYSICIAN:  Christin Bach, M.D.  LABOR SUMMARY AND DELIVERY NOTE:  The patient was admitted early in the morning of April 23, 2001 with two laminaria placed inside the cervix and allowed to stay for 4-5 hours.  They are removed at 2 p.m. and Prostin E2 20 mg suppository was initiated on a 3-hour basis.  These were used multiple times until the patient finally began to have contractions in the late afternoon.  She progressed to have strong labor requiring analgesics as we approached midnight and delivered the fetus at approximately 2:15, delivering a stillborn infant with obvious maceration. The cord was extremely tiny and appeared twisted.  The patient continued to contract through the night but did not expel the placenta until the following morning when ring forceps could be used to gently place traction on the placenta and combined with downward pressure on the perineum and we were able to extract the placenta as an intact single specimen.  The patient had estimated blood loss of 250-300 cc with this entire process. She is noted to have blood type O positive and she will be observed for 6 hours and discharged home. Dictated by:   Christin Bach, M.D. Attending Physician:  Tilda Burrow DD:  04/24/01 TD:  04/25/01 Job: [id number]

## 2010-10-20 LAB — URINALYSIS, ROUTINE W REFLEX MICROSCOPIC
Glucose, UA: NEGATIVE
Hgb urine dipstick: NEGATIVE
Protein, ur: NEGATIVE
pH: 5.5

## 2010-11-11 LAB — DIFFERENTIAL
Basophils Absolute: 0.1
Basophils Relative: 1
Lymphocytes Relative: 20
Monocytes Absolute: 0.8 — ABNORMAL HIGH
Neutro Abs: 6.6
Neutrophils Relative %: 69

## 2010-11-11 LAB — COMPREHENSIVE METABOLIC PANEL
Alkaline Phosphatase: 43
BUN: 5 — ABNORMAL LOW
CO2: 26
Chloride: 101
Creatinine, Ser: 0.57
GFR calc non Af Amer: >60
Glucose, Bld: 92
Potassium: 4
Total Bilirubin: 0.7

## 2010-11-11 LAB — RH IMMUNE GLOB WKUP(>/=20WKS)(NOT WOMEN'S HOSP)

## 2010-11-11 LAB — CBC
HCT: 40.2
Hemoglobin: 14
MCV: 93.6
Platelets: 276
WBC: 9.6

## 2011-11-07 DIAGNOSIS — R079 Chest pain, unspecified: Secondary | ICD-10-CM

## 2013-02-11 ENCOUNTER — Encounter (INDEPENDENT_AMBULATORY_CARE_PROVIDER_SITE_OTHER): Payer: Self-pay

## 2013-02-11 ENCOUNTER — Ambulatory Visit (INDEPENDENT_AMBULATORY_CARE_PROVIDER_SITE_OTHER): Payer: Medicaid Other | Admitting: Adult Health

## 2013-02-11 ENCOUNTER — Encounter: Payer: Self-pay | Admitting: Adult Health

## 2013-02-11 ENCOUNTER — Other Ambulatory Visit (HOSPITAL_COMMUNITY)
Admission: RE | Admit: 2013-02-11 | Discharge: 2013-02-11 | Disposition: A | Discharge status: Home / Self Care | Payer: Medicaid Other | Source: Ambulatory Visit | Attending: Adult Health | Admitting: Adult Health

## 2013-02-11 VITALS — BP 110/70 | HR 80 | Ht 62.5 in | Wt 150.0 lb

## 2013-02-11 DIAGNOSIS — Z01419 Encounter for gynecological examination (general) (routine) without abnormal findings: Secondary | ICD-10-CM | POA: Insufficient documentation

## 2013-02-11 DIAGNOSIS — Z1151 Encounter for screening for human papillomavirus (HPV): Secondary | ICD-10-CM | POA: Insufficient documentation

## 2013-02-11 DIAGNOSIS — F419 Anxiety disorder, unspecified: Secondary | ICD-10-CM

## 2013-02-11 DIAGNOSIS — Z0000 Encounter for general adult medical examination without abnormal findings: Secondary | ICD-10-CM

## 2013-02-11 HISTORY — DX: Anxiety disorder, unspecified: F41.9

## 2013-02-11 MED ORDER — ALPRAZOLAM 1 MG PO TABS: 1.0000 mg | ORAL_TABLET | Freq: Two times a day (BID) | ORAL | Status: DC | PRN

## 2013-02-11 MED ORDER — ESCITALOPRAM OXALATE 10 MG PO TABS: 10.0000 mg | ORAL_TABLET | Freq: Every day | ORAL | Status: DC

## 2013-02-11 NOTE — Patient Instructions (Signed)
Generalized Anxiety Disorder Generalized anxiety disorder (GAD) is a mental disorder. It interferes with life functions, including relationships, work, and school. GAD is different from normal anxiety, which everyone experiences at some point in their lives in response to specific life events and activities. Normal anxiety actually helps us prepare for and get through these life events and activities. Normal anxiety goes away after the event or activity is over.  GAD causes anxiety that is not necessarily related to specific events or activities. It also causes excess anxiety in proportion to specific events or activities. The anxiety associated with GAD is also difficult to control. GAD can vary from mild to severe. People with severe GAD can have intense waves of anxiety with physical symptoms (panic attacks).  SYMPTOMS The anxiety and worry associated with GAD are difficult to control. This anxiety and worry are related to many life events and activities and also occur more days than not for 6 months or longer. People with GAD also have three or more of the following symptoms (one or more in children):  Restlessness.   Fatigue.  Difficulty concentrating.   Irritability.  Muscle tension.  Difficulty sleeping or unsatisfying sleep. DIAGNOSIS GAD is diagnosed through an assessment by your caregiver. Your caregiver will ask you questions aboutyour mood,physical symptoms, and events in your life. Your caregiver may ask you about your medical history and use of alcohol or drugs, including prescription medications. Your caregiver may also do a physical exam and blood tests. Certain medical conditions and the use of certain substances can cause symptoms similar to those associated with GAD. Your caregiver may refer you to a mental health specialist for further evaluation. TREATMENT The following therapies are usually used to treat GAD:   Medication Antidepressant medication usually is  prescribed for long-term daily control. Antianxiety medications may be added in severe cases, especially when panic attacks occur.   Talk therapy (psychotherapy) Certain types of talk therapy can be helpful in treating GAD by providing support, education, and guidance. A form of talk therapy called cognitive behavioral therapy can teach you healthy ways to think about and react to daily life events and activities.  Stress managementtechniques These include yoga, meditation, and exercise and can be very helpful when they are practiced regularly. A mental health specialist can help determine which treatment is best for you. Some people see improvement with one therapy. However, other people require a combination of therapies. Document Released: 05/13/2012 Document Reviewed: 05/13/2012 [facility] Patient Information 2014 [place]ExitCare, [place]. Take xanax and lexapro [facility] Follow up in 4 weeks Physical in 1 year Get mammogram [phone]
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
## 2013-02-12 ENCOUNTER — Other Ambulatory Visit: Payer: Self-pay | Admitting: Adult Health

## 2013-02-12 DIAGNOSIS — Z139 Encounter for screening, unspecified: Secondary | ICD-10-CM

## 2013-02-17 ENCOUNTER — Ambulatory Visit (HOSPITAL_COMMUNITY): Payer: Medicaid Other

## 2013-03-07 ENCOUNTER — Ambulatory Visit (HOSPITAL_COMMUNITY)
Admission: RE | Admit: 2013-03-07 | Discharge: 2013-03-07 | Disposition: A | Discharge status: Home / Self Care | Payer: Medicaid Other | Source: Ambulatory Visit | Attending: Adult Health | Admitting: Adult Health

## 2013-03-07 DIAGNOSIS — Z139 Encounter for screening, unspecified: Secondary | ICD-10-CM

## 2013-03-07 DIAGNOSIS — Z1231 Encounter for screening mammogram for malignant neoplasm of breast: Secondary | ICD-10-CM | POA: Insufficient documentation

## 2013-03-11 ENCOUNTER — Ambulatory Visit (INDEPENDENT_AMBULATORY_CARE_PROVIDER_SITE_OTHER): Payer: Medicaid Other | Admitting: Adult Health

## 2013-03-11 ENCOUNTER — Encounter: Payer: Self-pay | Admitting: Adult Health

## 2013-03-11 VITALS — BP 110/76 | Ht 64.0 in | Wt 154.0 lb

## 2013-03-11 DIAGNOSIS — F411 Generalized anxiety disorder: Secondary | ICD-10-CM

## 2013-03-11 DIAGNOSIS — F419 Anxiety disorder, unspecified: Secondary | ICD-10-CM

## 2013-03-11 NOTE — Patient Instructions (Signed)
Physical in 1 year Follow up prn

## 2013-03-11 NOTE — Progress Notes (Signed)
Subjective:     Patient ID: Pryor Montesobin L Delano, female   DOB: 03/04/1970, 43 y.o.   MRN: [id number]  HPI Zella BallRobin is back in follow up of starting lexapro and xanax for anxiety secondary to family stress.She is better and [facility] and [facility] daughter.  Review of Systems See HPI Reviewed past medical,surgical, social and family history. Reviewed medications and allergies.     Objective:   Physical Exam BP 110/76  Ht 5\' 4"  (1.626 m)  Wt 154 lb (69.854 kg)  BMI 26.42 kg/m2  LMP 02/21/2013   talk only, doing much better, can actually smile today, [facility] increased lexapro to 20 mg and is now filling her xanax.  Assessment:     Anxiety    Plan:     [facility]n

## 2013-04-14 ENCOUNTER — Emergency Department (HOSPITAL_COMMUNITY)
Admission: EM | Admit: 2013-04-14 | Discharge: 2013-04-14 | Disposition: A | Discharge status: Home / Self Care | Payer: Medicaid Other | Attending: Emergency Medicine | Admitting: Emergency Medicine

## 2013-04-14 ENCOUNTER — Emergency Department (HOSPITAL_COMMUNITY): Payer: Medicaid Other

## 2013-04-14 ENCOUNTER — Encounter (HOSPITAL_COMMUNITY): Payer: Self-pay | Admitting: Emergency Medicine

## 2013-04-14 DIAGNOSIS — S6000XA Contusion of unspecified finger without damage to nail, initial encounter: Secondary | ICD-10-CM

## 2013-04-14 DIAGNOSIS — F489 Nonpsychotic mental disorder, unspecified: Secondary | ICD-10-CM | POA: Insufficient documentation

## 2013-04-14 DIAGNOSIS — W208XXA Other cause of strike by thrown, projected or falling object, initial encounter: Secondary | ICD-10-CM | POA: Insufficient documentation

## 2013-04-14 DIAGNOSIS — Z8719 Personal history of other diseases of the digestive system: Secondary | ICD-10-CM | POA: Insufficient documentation

## 2013-04-14 DIAGNOSIS — Z880 Allergy status to penicillin: Secondary | ICD-10-CM | POA: Insufficient documentation

## 2013-04-14 DIAGNOSIS — Y939 Activity, unspecified: Secondary | ICD-10-CM | POA: Insufficient documentation

## 2013-04-14 DIAGNOSIS — Z79899 Other long term (current) drug therapy: Secondary | ICD-10-CM | POA: Insufficient documentation

## 2013-04-14 DIAGNOSIS — Y929 Unspecified place or not applicable: Secondary | ICD-10-CM | POA: Insufficient documentation

## 2013-04-14 DIAGNOSIS — F411 Generalized anxiety disorder: Secondary | ICD-10-CM | POA: Insufficient documentation

## 2013-04-14 DIAGNOSIS — F17200 Nicotine dependence, unspecified, uncomplicated: Secondary | ICD-10-CM | POA: Insufficient documentation

## 2013-04-14 MED ORDER — BACITRACIN ZINC 500 UNIT/GM EX OINT
TOPICAL_OINTMENT | CUTANEOUS | Status: AC
  Administered 2013-04-14: 18:00:00
  Filled 2013-04-14: qty 0.9

## 2013-04-14 NOTE — ED Notes (Signed)
Pt reports that she developed right middle finger pain after she dropped the toaster on her hand.  Pt has 1cm laceration on her finger, bleeding controlled. +CMS.

## 2013-04-14 NOTE — ED Notes (Signed)
Lac to rt middle finger, dropped toaster on finger, lac present.

## 2013-04-14 NOTE — Discharge Instructions (Signed)
Hand Contusion A hand contusion is a deep bruise on your hand area. Contusions are the result of an injury that caused bleeding under the skin. The contusion may turn blue, purple, or yellow. Minor injuries will give you a painless contusion, but more severe contusions may stay painful and swollen for a few weeks. CAUSES  A contusion is usually caused by a blow, trauma, or direct force to an area of the body. SYMPTOMS   Swelling and redness of the injured area.  Discoloration of the injured area.  Tenderness and soreness of the injured area.  Pain. DIAGNOSIS  The diagnosis can be made by taking a history and performing a physical exam. An X-ray, CT scan, or MRI may be needed to determine if there were any associated injuries, such as broken bones (fractures). TREATMENT  Often, the best treatment for a hand contusion is resting, elevating, icing, and applying cold compresses to the injured area. Over-the-counter medicines may also be recommended for pain control. HOME CARE INSTRUCTIONS   Put ice on the injured area.  Put ice in a plastic bag.  Place a towel between your skin and the bag.  Leave the ice on for 15-20 minutes, 03-04 times a day.  Only take over-the-counter or prescription medicines as directed by your caregiver. Your caregiver may recommend avoiding anti-inflammatory medicines (aspirin, ibuprofen, and naproxen) for 48 hours because these medicines may increase bruising.  If told, use an elastic wrap as directed. This can help reduce swelling. You may remove the wrap for sleeping, showering, and bathing. If your fingers become numb, cold, or blue, take the wrap off and reapply it more loosely.  Elevate your hand with pillows to reduce swelling.  Avoid overusing your hand if it is painful. SEEK IMMEDIATE MEDICAL CARE IF:   You have increased redness, swelling, or pain in your hand.  Your swelling or pain is not relieved with medicines.  You have loss of feeling in  your hand or are unable to move your fingers.  Your hand turns cold or blue.  You have pain when you move your fingers.  Your hand becomes warm to the touch.  Your contusion does not improve in 2 days. MAKE SURE YOU:   Understand these instructions.  Will watch your condition.  Will get help right away if you are not doing well or get worse. Document Released: 07/08/2001 Document Revised: 10/11/2011 Document Reviewed: 07/10/2011 [facility] Patient Information 2014 [place], [place]LL[place].  Take ibuprofen for discomfort Wear finger splint Elevate and use use ice packs to minimize swelling
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
## 2013-04-19 NOTE — ED Provider Notes (Signed)
Medical screening examination/treatment/procedure(s) were performed by non-physician practitioner and as supervising physician I was immediately available for consultation/collaboration.   EKG Interpretation None       Eain Mullendore, MD 04/19/13 0747 

## 2013-07-25 ENCOUNTER — Other Ambulatory Visit: Payer: Self-pay | Admitting: Neurology

## 2013-07-25 DIAGNOSIS — M545 Low back pain: Secondary | ICD-10-CM

## 2013-08-07 ENCOUNTER — Ambulatory Visit (HOSPITAL_COMMUNITY)
Admission: RE | Admit: 2013-08-07 | Discharge: 2013-08-07 | Disposition: A | Discharge status: Home / Self Care | Payer: Medicaid Other | Source: Ambulatory Visit | Attending: Neurology | Admitting: Neurology

## 2013-08-07 DIAGNOSIS — M545 Low back pain, unspecified: Secondary | ICD-10-CM | POA: Insufficient documentation

## 2013-09-10 ENCOUNTER — Telehealth: Payer: Self-pay | Admitting: Adult Health

## 2013-09-10 NOTE — Telephone Encounter (Signed)
Spoke with pt. Pt is having abnormal periods and wanted something called in. I advised she would need an appt to discuss. Pt voiced understanding. Call transferred to front desk for appt. JSY

## 2013-09-15 ENCOUNTER — Ambulatory Visit: Payer: Medicaid Other | Admitting: Adult Health

## 2013-12-01 ENCOUNTER — Encounter (HOSPITAL_COMMUNITY): Payer: Self-pay | Admitting: Emergency Medicine

## 2014-05-19 ENCOUNTER — Ambulatory Visit (INDEPENDENT_AMBULATORY_CARE_PROVIDER_SITE_OTHER): Payer: Medicaid Other | Admitting: Adult Health

## 2014-05-19 ENCOUNTER — Encounter: Payer: Self-pay | Admitting: Adult Health

## 2014-05-19 VITALS — BP 138/98 | HR 84 | Ht 64.0 in | Wt 164.0 lb

## 2014-05-19 DIAGNOSIS — N390 Urinary tract infection, site not specified: Secondary | ICD-10-CM

## 2014-05-19 DIAGNOSIS — R350 Frequency of micturition: Secondary | ICD-10-CM | POA: Insufficient documentation

## 2014-05-19 DIAGNOSIS — R319 Hematuria, unspecified: Secondary | ICD-10-CM

## 2014-05-19 HISTORY — DX: Frequency of micturition: R35.0

## 2014-05-19 HISTORY — DX: Urinary tract infection, site not specified: N39.0

## 2014-05-19 HISTORY — DX: Hematuria, unspecified: R31.9

## 2014-05-19 LAB — POCT URINALYSIS DIPSTICK
NITRITE UA: NEGATIVE
Protein, UA: NEGATIVE

## 2014-05-19 MED ORDER — SULFAMETHOXAZOLE-TRIMETHOPRIM 800-160 MG PO TABS: 1.0000 | ORAL_TABLET | Freq: Two times a day (BID) | ORAL | Status: DC

## 2014-05-19 MED ORDER — PHENAZOPYRIDINE HCL 200 MG PO TABS: 200.0000 mg | ORAL_TABLET | Freq: Three times a day (TID) | ORAL | Status: DC | PRN

## 2014-05-19 NOTE — Patient Instructions (Signed)
Urinary Tract Infection Urinary tract infections (UTIs) can develop anywhere along your urinary tract. Your urinary tract is your body's drainage system for removing wastes and extra water. Your urinary tract includes two kidneys, two ureters, a bladder, and a urethra. Your kidneys are a pair of bean-shaped organs. Each kidney is about the size of your fist. They are located below your ribs, one on each side of your spine. CAUSES Infections are caused by microbes, which are microscopic organisms, including fungi, viruses, and bacteria. These organisms are so small that they can only be seen through a microscope. Bacteria are the microbes that most commonly cause UTIs. SYMPTOMS  Symptoms of UTIs may vary by age and gender of the patient and by the location of the infection. Symptoms in young women typically include a frequent and intense urge to urinate and a painful, burning feeling in the bladder or urethra during urination. Older women and men are more likely to be tired, shaky, and weak and have muscle aches and abdominal pain. A fever may mean the infection is in your kidneys. Other symptoms of a kidney infection include pain in your back or sides below the ribs, nausea, and vomiting. DIAGNOSIS To diagnose a UTI, your caregiver will ask you about your symptoms. Your caregiver also will ask to provide a urine sample. The urine sample will be tested for bacteria and white blood cells. White blood cells are made by your body to help fight infection. TREATMENT  Typically, UTIs can be treated with medication. Because most UTIs are caused by a bacterial infection, they usually can be treated with the use of antibiotics. The choice of antibiotic and length of treatment depend on your symptoms and the type of bacteria causing your infection. HOME CARE INSTRUCTIONS  If you were prescribed antibiotics, take them exactly as your caregiver instructs you. Finish the medication even if you feel better after you  have only taken some of the medication.  Drink enough water and fluids to keep your urine clear or pale yellow.  Avoid caffeine, tea, and carbonated beverages. They tend to irritate your bladder.  Empty your bladder often. Avoid holding urine for long periods of time.  Empty your bladder before and after sexual intercourse.  After a bowel movement, women should cleanse from front to back. Use each tissue only once. SEEK MEDICAL CARE IF:   You have back pain.  You develop a fever.  Your symptoms do not begin to resolve within 3 days. SEEK IMMEDIATE MEDICAL CARE IF:   You have severe back pain or lower abdominal pain.  You develop chills.  You have nausea or vomiting.  You have continued burning or discomfort with urination. MAKE SURE YOU:   Understand these instructions.  Will watch your condition.  Will get help right away if you are not doing well or get worse. Document Released: 10/26/2004 Document Revised: 07/18/2011 Document Reviewed: 02/24/2011 [facility] Patient Information 2015 [place]ExitCare, [place]LL[place]. This information is not intended to replace advice given to you by your health care provider. Make sure you discuss any questions you have with your health care provider. Push fluids Take meds Follow up prn

## 2014-05-19 NOTE — Progress Notes (Signed)
Subjective:     Patient ID: Jamie Evans, female   DOB: 09/13/1970, 44 y.o.   MRN: [id number]  HPI Jamie Evans is a 44 year old white female in complaining of urinary frequency and pressure for 4 days with low back pain.Has seen some blood when wipes.  Review of Systems +urinary frequency +low back pain + headache, all other systems negative Reviewed past medical,surgical, social and family history. Reviewed medications and allergies.     Objective:   Physical Exam BP 138/98 mmHg  Pulse 84  Ht 5\' 4"  (1.626 m)  Wt 164 lb (74.39 kg)  BMI 28.14 kg/m2  LMP 05/05/2014 (Approximate)urine 3+ blood and 2 + leuks, No CVAT,Skin warm and dry.Pelvic: external genitalia is normal in appearance no lesions, vagina: white discharge without odor,urethra has no lesions or masses noted, cervix:smooth and bulbous, uterus: normal size, shape and contour, non tender, no masses felt, adnexa: no masses or tenderness noted. Bladder is tender and no masses felt.Abdomen is soft and non tender, no HSM.    Assessment:     Urinary frequency Hematuria UTI     Plan:    UA C&S sent  Rx septra ds 1 bid x 7 days #14 no refills Rx pyridium 200 mg #10 1 tid Push water Follow up prn

## 2014-05-20 LAB — MICROSCOPIC EXAMINATION: Casts: NONE SEEN /lpf

## 2014-05-20 LAB — URINALYSIS, ROUTINE W REFLEX MICROSCOPIC
Bilirubin, UA: NEGATIVE
Glucose, UA: NEGATIVE
KETONES UA: NEGATIVE
NITRITE UA: POSITIVE — AB
PH UA: 6 (ref 5.0–7.5)
SPEC GRAV UA: 1.017 (ref 1.005–1.030)
Urobilinogen, Ur: 0.2 mg/dL (ref 0.2–1.0)

## 2014-05-21 LAB — URINE CULTURE

## 2014-05-26 ENCOUNTER — Telehealth: Payer: Self-pay | Admitting: Adult Health

## 2014-05-26 MED ORDER — NITROFURANTOIN MONOHYD MACRO 100 MG PO CAPS: 100.0000 mg | ORAL_CAPSULE | Freq: Two times a day (BID) | ORAL | Status: DC

## 2014-05-26 NOTE — Telephone Encounter (Signed)
Left message that culture showed E coli and that was resistant to septra ds will rx macrobid

## 2014-10-25 ENCOUNTER — Emergency Department (HOSPITAL_COMMUNITY)
Admission: EM | Admit: 2014-10-25 | Discharge: 2014-10-25 | Disposition: A | Discharge status: Home / Self Care | Payer: Medicaid Other | Attending: Emergency Medicine | Admitting: Emergency Medicine

## 2014-10-25 ENCOUNTER — Encounter (HOSPITAL_COMMUNITY): Payer: Self-pay | Admitting: *Deleted

## 2014-10-25 DIAGNOSIS — F419 Anxiety disorder, unspecified: Secondary | ICD-10-CM | POA: Diagnosis not present

## 2014-10-25 DIAGNOSIS — Z792 Long term (current) use of antibiotics: Secondary | ICD-10-CM | POA: Diagnosis not present

## 2014-10-25 DIAGNOSIS — Z720 Tobacco use: Secondary | ICD-10-CM | POA: Diagnosis not present

## 2014-10-25 DIAGNOSIS — Z880 Allergy status to penicillin: Secondary | ICD-10-CM | POA: Diagnosis not present

## 2014-10-25 DIAGNOSIS — Z87440 Personal history of urinary (tract) infections: Secondary | ICD-10-CM | POA: Diagnosis not present

## 2014-10-25 DIAGNOSIS — K0889 Other specified disorders of teeth and supporting structures: Secondary | ICD-10-CM

## 2014-10-25 DIAGNOSIS — Z8719 Personal history of other diseases of the digestive system: Secondary | ICD-10-CM | POA: Insufficient documentation

## 2014-10-25 DIAGNOSIS — K088 Other specified disorders of teeth and supporting structures: Secondary | ICD-10-CM | POA: Diagnosis present

## 2014-10-25 DIAGNOSIS — K029 Dental caries, unspecified: Secondary | ICD-10-CM | POA: Insufficient documentation

## 2014-10-25 MED ORDER — TRAMADOL HCL 50 MG PO TABS
100.0000 mg | ORAL_TABLET | Freq: Once | ORAL | Status: AC
  Administered 2014-10-25: 100 mg via ORAL
  Filled 2014-10-25: qty 2

## 2014-10-25 MED ORDER — ONDANSETRON HCL 4 MG PO TABS
4.0000 mg | ORAL_TABLET | Freq: Once | ORAL | Status: AC
Start: 2014-10-25 — End: 2014-10-25
  Administered 2014-10-25: 4 mg via ORAL
  Filled 2014-10-25: qty 1

## 2014-10-25 MED ORDER — IBUPROFEN 800 MG PO TABS
800.0000 mg | ORAL_TABLET | Freq: Once | ORAL | Status: AC
Start: 2014-10-25 — End: 2014-10-25
  Administered 2014-10-25: 800 mg via ORAL
  Filled 2014-10-25: qty 1

## 2014-10-25 MED ORDER — CEPHALEXIN 500 MG PO CAPS
500.0000 mg | ORAL_CAPSULE | Freq: Once | ORAL | Status: AC
Start: 2014-10-25 — End: 2014-10-25
  Administered 2014-10-25: 500 mg via ORAL
  Filled 2014-10-25: qty 1

## 2014-10-25 MED ORDER — CLINDAMYCIN HCL 150 MG PO CAPS: 150.0000 mg | ORAL_CAPSULE | Freq: Four times a day (QID) | ORAL | Status: DC

## 2014-10-25 MED ORDER — TRAMADOL HCL 50 MG PO TABS: 50.0000 mg | ORAL_TABLET | Freq: Four times a day (QID) | ORAL | Status: DC | PRN

## 2014-10-25 MED ORDER — IBUPROFEN 600 MG PO TABS: 600.0000 mg | ORAL_TABLET | Freq: Four times a day (QID) | ORAL | Status: DC | PRN

## 2014-10-25 NOTE — ED Notes (Signed)
Pt c/o broken tooth to right lower jaw and states she is having pain

## 2014-10-25 NOTE — Discharge Instructions (Signed)
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
## 2015-01-13 ENCOUNTER — Telehealth: Payer: Self-pay | Admitting: Adult Health

## 2015-01-14 NOTE — Telephone Encounter (Signed)
Left message x 2. JSY 

## 2015-01-18 NOTE — Telephone Encounter (Signed)
Pt unavailable @ 10:07 am. JSY

## 2015-01-20 NOTE — Telephone Encounter (Signed)
Pt unavailable @ 2:23 pm. Multiple attempts to reach pt without success. Encounter closed. JSY

## 2015-10-05 ENCOUNTER — Inpatient Hospital Stay (HOSPITAL_COMMUNITY)
Admission: EM | Admit: 2015-10-05 | Discharge: 2015-10-13 | DRG: 896 | Disposition: A | Discharge status: Home / Self Care | Attending: Internal Medicine | Admitting: Internal Medicine

## 2015-10-05 ENCOUNTER — Observation Stay (HOSPITAL_COMMUNITY)

## 2015-10-05 ENCOUNTER — Emergency Department (HOSPITAL_COMMUNITY)

## 2015-10-05 ENCOUNTER — Encounter (HOSPITAL_COMMUNITY): Payer: Self-pay | Admitting: *Deleted

## 2015-10-05 DIAGNOSIS — F1410 Cocaine abuse, uncomplicated: Secondary | ICD-10-CM | POA: Diagnosis present

## 2015-10-05 DIAGNOSIS — R7989 Other specified abnormal findings of blood chemistry: Secondary | ICD-10-CM

## 2015-10-05 DIAGNOSIS — I6783 Posterior reversible encephalopathy syndrome: Secondary | ICD-10-CM | POA: Diagnosis present

## 2015-10-05 DIAGNOSIS — Z823 Family history of stroke: Secondary | ICD-10-CM

## 2015-10-05 DIAGNOSIS — F419 Anxiety disorder, unspecified: Secondary | ICD-10-CM | POA: Diagnosis present

## 2015-10-05 DIAGNOSIS — R945 Abnormal results of liver function studies: Secondary | ICD-10-CM

## 2015-10-05 DIAGNOSIS — F17210 Nicotine dependence, cigarettes, uncomplicated: Secondary | ICD-10-CM | POA: Diagnosis present

## 2015-10-05 DIAGNOSIS — Z833 Family history of diabetes mellitus: Secondary | ICD-10-CM

## 2015-10-05 DIAGNOSIS — G9340 Encephalopathy, unspecified: Secondary | ICD-10-CM | POA: Diagnosis present

## 2015-10-05 DIAGNOSIS — R300 Dysuria: Secondary | ICD-10-CM | POA: Diagnosis present

## 2015-10-05 DIAGNOSIS — F1910 Other psychoactive substance abuse, uncomplicated: Secondary | ICD-10-CM | POA: Diagnosis present

## 2015-10-05 DIAGNOSIS — G92 Toxic encephalopathy: Secondary | ICD-10-CM | POA: Diagnosis present

## 2015-10-05 DIAGNOSIS — R410 Disorientation, unspecified: Secondary | ICD-10-CM

## 2015-10-05 DIAGNOSIS — Z9049 Acquired absence of other specified parts of digestive tract: Secondary | ICD-10-CM

## 2015-10-05 DIAGNOSIS — I10 Essential (primary) hypertension: Secondary | ICD-10-CM | POA: Diagnosis present

## 2015-10-05 DIAGNOSIS — R4182 Altered mental status, unspecified: Secondary | ICD-10-CM

## 2015-10-05 DIAGNOSIS — H538 Other visual disturbances: Secondary | ICD-10-CM

## 2015-10-05 DIAGNOSIS — E860 Dehydration: Secondary | ICD-10-CM | POA: Diagnosis present

## 2015-10-05 DIAGNOSIS — Z841 Family history of disorders of kidney and ureter: Secondary | ICD-10-CM

## 2015-10-05 DIAGNOSIS — D751 Secondary polycythemia: Secondary | ICD-10-CM | POA: Diagnosis present

## 2015-10-05 DIAGNOSIS — F1123 Opioid dependence with withdrawal: Principal | ICD-10-CM | POA: Diagnosis present

## 2015-10-05 DIAGNOSIS — R32 Unspecified urinary incontinence: Secondary | ICD-10-CM | POA: Diagnosis present

## 2015-10-05 DIAGNOSIS — D72829 Elevated white blood cell count, unspecified: Secondary | ICD-10-CM

## 2015-10-05 DIAGNOSIS — Z8249 Family history of ischemic heart disease and other diseases of the circulatory system: Secondary | ICD-10-CM

## 2015-10-05 DIAGNOSIS — Z825 Family history of asthma and other chronic lower respiratory diseases: Secondary | ICD-10-CM

## 2015-10-05 DIAGNOSIS — E876 Hypokalemia: Secondary | ICD-10-CM | POA: Diagnosis present

## 2015-10-05 LAB — URINALYSIS, ROUTINE W REFLEX MICROSCOPIC
Bilirubin Urine: NEGATIVE
GLUCOSE, UA: NEGATIVE mg/dL
LEUKOCYTES UA: NEGATIVE
NITRITE: NEGATIVE
PH: 6 (ref 5.0–8.0)
PROTEIN: 30 mg/dL — AB
SPECIFIC GRAVITY, URINE: 1.025 (ref 1.005–1.030)

## 2015-10-05 LAB — COMPREHENSIVE METABOLIC PANEL
ALBUMIN: 4.5 g/dL (ref 3.5–5.0)
ALT: 64 U/L — ABNORMAL HIGH (ref 14–54)
ANION GAP: 13 (ref 5–15)
AST: 25 U/L (ref 15–41)
Alkaline Phosphatase: 116 U/L (ref 38–126)
BUN: 19 mg/dL (ref 6–20)
CHLORIDE: 103 mmol/L (ref 101–111)
CO2: 26 mmol/L (ref 22–32)
Calcium: 9.6 mg/dL (ref 8.9–10.3)
Creatinine, Ser: 0.9 mg/dL (ref 0.44–1.00)
GFR calc Af Amer: >60 mL/min (ref >60)
Glucose, Bld: 129 mg/dL — ABNORMAL HIGH (ref 65–99)
POTASSIUM: 3.1 mmol/L — AB (ref 3.5–5.1)
Sodium: 142 mmol/L (ref 135–145)
Total Bilirubin: 1.5 mg/dL — ABNORMAL HIGH (ref 0.3–1.2)
Total Protein: 8.3 g/dL — ABNORMAL HIGH (ref 6.5–8.1)

## 2015-10-05 LAB — CBC
HEMATOCRIT: 48.7 % — AB (ref 36.0–46.0)
HEMOGLOBIN: 17.7 g/dL — AB (ref 12.0–15.0)
MCH: 31.5 pg (ref 26.0–34.0)
MCHC: 36.3 g/dL — ABNORMAL HIGH (ref 30.0–36.0)
MCV: 86.7 fL (ref 78.0–100.0)
Platelets: 268 10*3/uL (ref 150–400)
RBC: 5.62 MIL/uL — ABNORMAL HIGH (ref 3.87–5.11)
RDW: 12.6 % (ref 11.5–15.5)
WBC: 17.1 10*3/uL — AB (ref 4.0–10.5)

## 2015-10-05 LAB — PROTIME-INR
INR: 1.01
PROTHROMBIN TIME: 13.3 s (ref 11.4–15.2)

## 2015-10-05 LAB — TSH: TSH: 0.256 u[IU]/mL — ABNORMAL LOW (ref 0.350–4.500)

## 2015-10-05 LAB — RAPID URINE DRUG SCREEN, HOSP PERFORMED
AMPHETAMINES: NOT DETECTED
BENZODIAZEPINES: NOT DETECTED
Barbiturates: NOT DETECTED
Cocaine: POSITIVE — AB
Opiates: POSITIVE — AB
TETRAHYDROCANNABINOL: NOT DETECTED

## 2015-10-05 LAB — AMMONIA: Ammonia: 44 umol/L — ABNORMAL HIGH (ref 9–35)

## 2015-10-05 LAB — I-STAT BETA HCG BLOOD, ED (MC, WL, AP ONLY): I-stat hCG, quantitative: <5 m[IU]/mL (ref <5)

## 2015-10-05 LAB — HCG, QUANTITATIVE, PREGNANCY

## 2015-10-05 LAB — URINE MICROSCOPIC-ADD ON

## 2015-10-05 LAB — LACTIC ACID, PLASMA
LACTIC ACID, VENOUS: 2.2 mmol/L — AB (ref 0.5–1.9)
Lactic Acid, Venous: 1.1 mmol/L (ref 0.5–1.9)

## 2015-10-05 LAB — SALICYLATE LEVEL: Salicylate Lvl: <4 mg/dL (ref 2.8–30.0)

## 2015-10-05 LAB — ACETAMINOPHEN LEVEL: Acetaminophen (Tylenol), Serum: <10 ug/mL — ABNORMAL LOW (ref 10–30)

## 2015-10-05 LAB — PROCALCITONIN: Procalcitonin: <0.1 ng/mL

## 2015-10-05 LAB — MAGNESIUM: Magnesium: 2.4 mg/dL (ref 1.7–2.4)

## 2015-10-05 LAB — ETHANOL: Alcohol, Ethyl (B): <5 mg/dL (ref <5)

## 2015-10-05 LAB — GLUCOSE, CAPILLARY: Glucose-Capillary: 118 mg/dL — ABNORMAL HIGH (ref 65–99)

## 2015-10-05 LAB — TROPONIN I: TROPONIN I: 0.05 ng/mL — AB (ref <0.03)

## 2015-10-05 LAB — CK: Total CK: 430 U/L — ABNORMAL HIGH (ref 38–234)

## 2015-10-05 LAB — MRSA PCR SCREENING: MRSA by PCR: NEGATIVE

## 2015-10-05 LAB — PREGNANCY, URINE: Preg Test, Ur: NEGATIVE

## 2015-10-05 MED ORDER — SODIUM CHLORIDE 0.9 % IV BOLUS (SEPSIS)
1000.0000 mL | Freq: Once | INTRAVENOUS | Status: AC
  Administered 2015-10-05: 1000 mL via INTRAVENOUS

## 2015-10-05 MED ORDER — SODIUM CHLORIDE 0.9% FLUSH
3.0000 mL | Freq: Two times a day (BID) | INTRAVENOUS | Status: DC
  Administered 2015-10-07 – 2015-10-13 (×12): 3 mL via INTRAVENOUS

## 2015-10-05 MED ORDER — POTASSIUM CHLORIDE 10 MEQ/100ML IV SOLN
10.0000 meq | INTRAVENOUS | Status: AC
  Administered 2015-10-05 (×4): 10 meq via INTRAVENOUS
  Filled 2015-10-05: qty 100

## 2015-10-05 MED ORDER — FOLIC ACID 5 MG/ML IJ SOLN
1.0000 mg | Freq: Every day | INTRAMUSCULAR | Status: DC
  Administered 2015-10-07: 1 mg via INTRAVENOUS
  Filled 2015-10-05 (×9): qty 0.2

## 2015-10-05 MED ORDER — ACETAMINOPHEN 325 MG PO TABS
650.0000 mg | ORAL_TABLET | Freq: Four times a day (QID) | ORAL | Status: DC | PRN
  Administered 2015-10-06 – 2015-10-12 (×4): 650 mg via ORAL
  Filled 2015-10-05 (×4): qty 2

## 2015-10-05 MED ORDER — ONDANSETRON HCL 4 MG PO TABS
4.0000 mg | ORAL_TABLET | Freq: Four times a day (QID) | ORAL | Status: DC | PRN
  Administered 2015-10-06 – 2015-10-13 (×3): 4 mg via ORAL
  Filled 2015-10-05 (×2): qty 1

## 2015-10-05 MED ORDER — ENOXAPARIN SODIUM 40 MG/0.4ML ~~LOC~~ SOLN
40.0000 mg | SUBCUTANEOUS | Status: DC
  Administered 2015-10-05 – 2015-10-12 (×8): 40 mg via SUBCUTANEOUS
  Filled 2015-10-05 (×8): qty 0.4

## 2015-10-05 MED ORDER — ACETAMINOPHEN 650 MG RE SUPP: 650.0000 mg | Freq: Four times a day (QID) | RECTAL | Status: DC | PRN

## 2015-10-05 MED ORDER — LORAZEPAM 2 MG/ML IJ SOLN
1.0000 mg | Freq: Once | INTRAMUSCULAR | Status: AC
  Administered 2015-10-05: 1 mg via INTRAVENOUS
  Filled 2015-10-05: qty 1

## 2015-10-05 MED ORDER — HYDRALAZINE HCL 20 MG/ML IJ SOLN
10.0000 mg | INTRAMUSCULAR | Status: DC | PRN
  Administered 2015-10-06 – 2015-10-12 (×2): 10 mg via INTRAVENOUS
  Filled 2015-10-05 (×3): qty 1

## 2015-10-05 MED ORDER — ONDANSETRON HCL 4 MG/2ML IJ SOLN
4.0000 mg | Freq: Four times a day (QID) | INTRAMUSCULAR | Status: DC | PRN
  Administered 2015-10-06 – 2015-10-13 (×5): 4 mg via INTRAVENOUS
  Filled 2015-10-05 (×6): qty 2

## 2015-10-05 MED ORDER — THIAMINE HCL 100 MG/ML IJ SOLN
100.0000 mg | Freq: Every day | INTRAMUSCULAR | Status: DC
  Administered 2015-10-06 – 2015-10-10 (×5): 100 mg via INTRAVENOUS
  Filled 2015-10-05 (×5): qty 2

## 2015-10-05 MED ORDER — PNEUMOCOCCAL VAC POLYVALENT 25 MCG/0.5ML IJ INJ: 0.5000 mL | INJECTION | INTRAMUSCULAR | Status: DC

## 2015-10-05 MED ORDER — SODIUM CHLORIDE 0.9 % IV SOLN
INTRAVENOUS | Status: AC
  Administered 2015-10-05: 18:00:00 via INTRAVENOUS

## 2015-10-05 MED ORDER — LORAZEPAM 2 MG/ML IJ SOLN
2.0000 mg | INTRAMUSCULAR | Status: DC | PRN
  Administered 2015-10-06: 3 mg via INTRAVENOUS
  Administered 2015-10-06 (×2): 2 mg via INTRAVENOUS
  Administered 2015-10-07: 3 mg via INTRAVENOUS
  Administered 2015-10-07: 2 mg via INTRAVENOUS
  Administered 2015-10-07 (×2): 3 mg via INTRAVENOUS
  Administered 2015-10-07: 2 mg via INTRAVENOUS
  Administered 2015-10-08 (×4): 3 mg via INTRAVENOUS
  Administered 2015-10-08: 2 mg via INTRAVENOUS
  Administered 2015-10-08: 3 mg via INTRAVENOUS
  Administered 2015-10-09 – 2015-10-11 (×11): 2 mg via INTRAVENOUS
  Filled 2015-10-05: qty 1
  Filled 2015-10-05: qty 2
  Filled 2015-10-05: qty 1
  Filled 2015-10-05 (×3): qty 2
  Filled 2015-10-05 (×4): qty 1
  Filled 2015-10-05: qty 2
  Filled 2015-10-05 (×3): qty 1
  Filled 2015-10-05: qty 2
  Filled 2015-10-05: qty 1
  Filled 2015-10-05: qty 2
  Filled 2015-10-05: qty 1
  Filled 2015-10-05: qty 2
  Filled 2015-10-05 (×3): qty 1
  Filled 2015-10-05: qty 2
  Filled 2015-10-05 (×2): qty 1
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
## 2015-10-06 ENCOUNTER — Observation Stay (HOSPITAL_COMMUNITY)

## 2015-10-06 ENCOUNTER — Observation Stay (HOSPITAL_BASED_OUTPATIENT_CLINIC_OR_DEPARTMENT_OTHER)

## 2015-10-06 DIAGNOSIS — Z9049 Acquired absence of other specified parts of digestive tract: Secondary | ICD-10-CM | POA: Diagnosis not present

## 2015-10-06 DIAGNOSIS — Z8249 Family history of ischemic heart disease and other diseases of the circulatory system: Secondary | ICD-10-CM | POA: Diagnosis not present

## 2015-10-06 DIAGNOSIS — E876 Hypokalemia: Secondary | ICD-10-CM | POA: Diagnosis not present

## 2015-10-06 DIAGNOSIS — F1123 Opioid dependence with withdrawal: Secondary | ICD-10-CM | POA: Diagnosis present

## 2015-10-06 DIAGNOSIS — I10 Essential (primary) hypertension: Secondary | ICD-10-CM | POA: Diagnosis present

## 2015-10-06 DIAGNOSIS — R4182 Altered mental status, unspecified: Secondary | ICD-10-CM | POA: Diagnosis present

## 2015-10-06 DIAGNOSIS — F1410 Cocaine abuse, uncomplicated: Secondary | ICD-10-CM | POA: Diagnosis present

## 2015-10-06 DIAGNOSIS — E860 Dehydration: Secondary | ICD-10-CM | POA: Diagnosis present

## 2015-10-06 DIAGNOSIS — R32 Unspecified urinary incontinence: Secondary | ICD-10-CM | POA: Diagnosis present

## 2015-10-06 DIAGNOSIS — Z833 Family history of diabetes mellitus: Secondary | ICD-10-CM | POA: Diagnosis not present

## 2015-10-06 DIAGNOSIS — G9340 Encephalopathy, unspecified: Secondary | ICD-10-CM | POA: Diagnosis not present

## 2015-10-06 DIAGNOSIS — Z825 Family history of asthma and other chronic lower respiratory diseases: Secondary | ICD-10-CM | POA: Diagnosis not present

## 2015-10-06 DIAGNOSIS — I6783 Posterior reversible encephalopathy syndrome: Secondary | ICD-10-CM | POA: Diagnosis present

## 2015-10-06 DIAGNOSIS — G92 Toxic encephalopathy: Secondary | ICD-10-CM | POA: Diagnosis present

## 2015-10-06 DIAGNOSIS — D72829 Elevated white blood cell count, unspecified: Secondary | ICD-10-CM | POA: Diagnosis present

## 2015-10-06 DIAGNOSIS — Z823 Family history of stroke: Secondary | ICD-10-CM | POA: Diagnosis not present

## 2015-10-06 DIAGNOSIS — D751 Secondary polycythemia: Secondary | ICD-10-CM | POA: Diagnosis present

## 2015-10-06 DIAGNOSIS — R7989 Other specified abnormal findings of blood chemistry: Secondary | ICD-10-CM

## 2015-10-06 DIAGNOSIS — Z841 Family history of disorders of kidney and ureter: Secondary | ICD-10-CM | POA: Diagnosis not present

## 2015-10-06 DIAGNOSIS — F419 Anxiety disorder, unspecified: Secondary | ICD-10-CM | POA: Diagnosis present

## 2015-10-06 DIAGNOSIS — F17210 Nicotine dependence, cigarettes, uncomplicated: Secondary | ICD-10-CM | POA: Diagnosis present

## 2015-10-06 LAB — CBC
HEMATOCRIT: 42.7 % (ref 36.0–46.0)
HEMOGLOBIN: 14.9 g/dL (ref 12.0–15.0)
MCH: 30.7 pg (ref 26.0–34.0)
MCHC: 34.9 g/dL (ref 30.0–36.0)
MCV: 88 fL (ref 78.0–100.0)
Platelets: 210 10*3/uL (ref 150–400)
RBC: 4.85 MIL/uL (ref 3.87–5.11)
RDW: 12.6 % (ref 11.5–15.5)
WBC: 11.9 10*3/uL — AB (ref 4.0–10.5)

## 2015-10-06 LAB — GASTROINTESTINAL PANEL BY PCR, STOOL (REPLACES STOOL CULTURE)
ASTROVIRUS: NOT DETECTED
Adenovirus F40/41: NOT DETECTED
CRYPTOSPORIDIUM: NOT DETECTED
CYCLOSPORA CAYETANENSIS: NOT DETECTED
Campylobacter species: NOT DETECTED
E. COLI O157: NOT DETECTED
ENTAMOEBA HISTOLYTICA: NOT DETECTED
ENTEROTOXIGENIC E COLI (ETEC): NOT DETECTED
Enteroaggregative E coli (EAEC): NOT DETECTED
Enteropathogenic E coli (EPEC): NOT DETECTED
Giardia lamblia: NOT DETECTED
Norovirus GI/GII: NOT DETECTED
Plesimonas shigelloides: NOT DETECTED
Rotavirus A: NOT DETECTED
SALMONELLA SPECIES: NOT DETECTED
SAPOVIRUS (I, II, IV, AND V): NOT DETECTED
Shiga like toxin producing E coli (STEC): NOT DETECTED
Shigella/Enteroinvasive E coli (EIEC): NOT DETECTED
VIBRIO CHOLERAE: NOT DETECTED
VIBRIO SPECIES: NOT DETECTED
YERSINIA ENTEROCOLITICA: NOT DETECTED

## 2015-10-06 LAB — COMPREHENSIVE METABOLIC PANEL
ALBUMIN: 3.6 g/dL (ref 3.5–5.0)
ALT: 42 U/L (ref 14–54)
ANION GAP: 7 (ref 5–15)
AST: 20 U/L (ref 15–41)
Alkaline Phosphatase: 87 U/L (ref 38–126)
BUN: 18 mg/dL (ref 6–20)
CHLORIDE: 108 mmol/L (ref 101–111)
CO2: 24 mmol/L (ref 22–32)
Calcium: 8.6 mg/dL — ABNORMAL LOW (ref 8.9–10.3)
Creatinine, Ser: 0.82 mg/dL (ref 0.44–1.00)
GFR calc Af Amer: >60 mL/min (ref >60)
GFR calc non Af Amer: >60 mL/min (ref >60)
GLUCOSE: 103 mg/dL — AB (ref 65–99)
POTASSIUM: 3.2 mmol/L — AB (ref 3.5–5.1)
Sodium: 139 mmol/L (ref 135–145)
Total Bilirubin: 1.3 mg/dL — ABNORMAL HIGH (ref 0.3–1.2)
Total Protein: 6.7 g/dL (ref 6.5–8.1)

## 2015-10-06 LAB — ECHOCARDIOGRAM COMPLETE
AOPV: 0.8 m/s
AOVTI: 35.8 cm
AV Area VTI index: 1.35 cm2/m2
AV Area mean vel: 2.09 cm2
AV VEL mean LVOT/AV: 0.82
AV pk vel: 168 cm/s
AVA: 2.08 cm2
AVAREAMEANVIN: 1.36 cm2/m2
AVAREAVTI: 2.03 cm2
AVG: 6 mmHg
AVPG: 11 mmHg
CHL CUP AV PEAK INDEX: 1.32
CHL CUP AV VALUE AREA INDEX: 1.35
CHL CUP AV VEL: 2.08
DOP CAL AO MEAN VELOCITY: 109 cm/s
E decel time: 271 msec
E/e' ratio: 11.92
FS: 44 % (ref 28–44)
HEIGHTINCHES: 64 in
IV/PV OW: 0.99
LA diam end sys: 34 mm
LA diam index: 2.21 cm/m2
LASIZE: 34 mm
LAVOL: 49.3 mL
LAVOLA4C: 44.1 mL
LAVOLIN: 32 mL/m2
LV TDI E'MEDIAL: 6.85
LVDIAVOL: 66 mL (ref 46–106)
LVDIAVOLIN: 43 mL/m2
LVEEAVG: 11.92
LVEEMED: 11.92
LVELAT: 8.27 cm/s
LVOT VTI: 29.3 cm
LVOT area: 2.54 cm2
LVOT peak grad rest: 7 mmHg
LVOT peak vel: 134 cm/s
LVOTD: 18 mm
LVOTSV: 74 mL
LVOTVTI: 0.82 cm
Lateral S' vel: 13.4 cm/s
MV Dec: 271
MV Peak grad: 4 mmHg
MV pk A vel: 66 m/s
MV pk E vel: 98.6 m/s
PW: 9.38 mm — AB (ref 0.6–1.1)
RV TAPSE: 26.1 mm
TDI e' lateral: 8.27
WEIGHTICAEL: 1862.45 [oz_av]

## 2015-10-06 LAB — GLUCOSE, CAPILLARY: Glucose-Capillary: 95 mg/dL (ref 65–99)

## 2015-10-06 LAB — CK: Total CK: 170 U/L (ref 38–234)

## 2015-10-06 LAB — TROPONIN I: Troponin I: 0.03 ng/mL (ref <0.03)

## 2015-10-06 LAB — T4, FREE: FREE T4: 1.12 ng/dL (ref 0.61–1.12)

## 2015-10-06 LAB — VITAMIN B12: VITAMIN B 12: 337 pg/mL (ref 180–914)

## 2015-10-06 MED ORDER — POTASSIUM CHLORIDE CRYS ER 20 MEQ PO TBCR
40.0000 meq | EXTENDED_RELEASE_TABLET | Freq: Once | ORAL | Status: AC
  Administered 2015-10-06: 40 meq via ORAL
  Filled 2015-10-06: qty 2

## 2015-10-06 MED ORDER — NICOTINE 14 MG/24HR TD PT24
14.0000 mg | MEDICATED_PATCH | Freq: Every day | TRANSDERMAL | Status: DC
Start: 2015-10-06 — End: 2015-10-08
  Administered 2015-10-06 – 2015-10-08 (×3): 14 mg via TRANSDERMAL
  Filled 2015-10-06 (×3): qty 1

## 2015-10-06 MED ORDER — CLONIDINE HCL 0.1 MG PO TABS
0.1000 mg | ORAL_TABLET | Freq: Three times a day (TID) | ORAL | Status: DC
  Administered 2015-10-06 – 2015-10-13 (×21): 0.1 mg via ORAL
  Filled 2015-10-06 (×21): qty 1

## 2015-10-06 MED ORDER — SODIUM CHLORIDE 0.9 % IV SOLN
INTRAVENOUS | Status: AC
  Administered 2015-10-06: 18:00:00 via INTRAVENOUS

## 2015-10-06 NOTE — Care Management Note (Signed)
Case Management Note  Patient Details  Name: Jamie Evans MRN: [id number] Date of Birth: 02/19/1970  Subjective/Objective:                  [facility] Guard at bedside. Anticipate pt will return to jail when DC'd. RN will need to call and give report prior to pt returning to jail. Pt not medically stable at this time.   Action/Plan: Will cont to follow.  Expected Discharge Date:      10/10/2015            Expected Discharge Plan:  [facility]rections Facility  In-House Referral:  NA  Discharge planning Services  CM Consult  Post Acute Care Choice:  NA Choice offered to:  NA  DME Arranged:    DME Agency:     HH Arranged:    HH Agency:     Status of Service:  In process, will continue to follow  If discussed at Long Length of Stay Meetings, dates discussed:    Additional Comments:  Malcolm MetroChildress, Garrick Midgley Demske, RN 10/06/2015, 3:31 PM

## 2015-10-06 NOTE — Progress Notes (Addendum)
Patient ID: Jamie Evans, female   DOB: 10-Jul-1970, 45 y.o.   MRN: [id number]                                                                PROGRESS NOTE                                                                                                                                                                                                             Patient Demographics:    Jamie Evans, is a 45 y.o. female, DOB - [birth year], WUJ:[id number]  Admit date - 10/05/2015   Admitting Physician Briscoe Deutscher, MD  Outpatient Primary MD for the patient is [facility] (Inactive)  LOS - 0  Outpatient Specialists:    Chief Complaint  Patient presents with  . Altered Mental Status       Brief Narrative  45 y.o. female with medical history significant for anxiety, cholelithiasis, and substance abuse who presents to the emergency department from jail with altered mental status. Patient was reportedly taken into custody on 10/03/2015 and was alert and fully oriented at that time. The following day, she was found to be very lethargic and poorly responsive, and apparently incontinent of stool. She persisted in this state throughout the day yesterday and today and was brought in for evaluation of this. History is obtained through discussion with corrections officer's, ED personnel, and review of the EMR. There was suspicion at the jail but the patient had had a seizure given her incontinence, but there was no seizure-like activity witnessed. Corrections officer's have very low suspicion for ingestion or trauma during custody. It is not known whether the patient has been using alcohol in excess prior to the incarceration, but the corrections officer reports that this would be consistent with the rest of her family's behavior. [name] has history of anxiety and had been prescribed benzodiazepine in the past. There's been no cough or vomiting witnessed. The patient has  occasionally voiced pain complaints wherever she is touched, but no other intelligible verbalizations.  ED Course: Upon arrival to the ED, patient is found to be be afebrile, saturating well on room air, hypertensive to the 180/110 range, and with vitals otherwise stable. EKG demonstrates a sinus rhythm with LVH by voltage criteria and nonspecific  T-wave abnormalities. Head CT is negative for acute intracranial abnormality. CMP features a serum potassium of 3.1, ALT mildly elevated 64, and mild elevation in total bilirubin 1.5. Ethanol level is undetectable, ammonia levels mildly elevated 44, and CK elevated at 4:30. CBC is notable for a leukocytosis to 17,100 and a polycythemia with hemoglobin of 17.7. INR is within the normal limits, UDS is positive for opiates and cocaine, and urinalysis features many bacteria, but no leukocyte or nitrite, and no significant WBC. A 1 L normal saline bolus was administered in the emergency department and the patient's condition remained largely unchanged. She was noted to have an episode of large volume diarrhea while in the ED. She has remained hemodynamically stable and will be observed in the stepdown unit for ongoing evaluation and management of alteration in mental status with various lab work abnormalities as described above.   Subjective:    Jamie Evans today appears more oriented.  axox2 (person, place,  Year 2012, ? Doesn't want to go back to jail),     No headache, No chest pain, No abdominal pain - No Nausea, No new weakness tingling or numbness, No Cough - SOB.    Assessment  & Plan :    Principal Problem:   Acute encephalopathy Active Problems:   Hypokalemia   Leukocytosis   Polycythemia   Hyperbilirubinemia   Hypertension   Substance abuse   Elevated LFTs    1. Acute encephalopathy  Improving MRI Brain today due to complaints of blurred vision Check ammonia level tomorrow  2. Leukocytosis  Improved  ? Due to coccaine use  3.  Hypertension  - Suspected secondary to withdrawal; much less-likely, the presentation could represent a hypertensive encephalopathy  - start on clonidine 0.1mg  po tid   4. Substance abuse  - UDS positive for cocaine and opiates; puncture wounds noted over UE veins  - Withdrawal likely complicates the presentation  - Uncertain alcohol-history; concerned for alcohol withdrawal as potential etiology for the presentation; monitoring with CIWA and prn Ativan  - SW consultation requested    5. Polycythemia  - ? Due to smoking and dehydration Repeat cbc in am  6. Hypokalemia  - Likely secondary to GI-losses  - replete with Kcl 40 meq po x1 Check cmp in am  7. Diarrhea  - Opiate withdrawal suspected; infection considered  - GI pathogen panel pending - Replace fluid losses and monitor lytes   8.  Tobacco use Nicotine patch  9. Abnormal lft MRI, MRCP  10.  CPK elevation Check cpk in am Hydrate with ns iv  11. Abnormal TSH Check tsh in am  12. Trop elevation ? Secondary to coccaine use Check trop Check cardiac echo   Code Status : FULL CODE  Family Communication  : w/ patient  Disposition Plan  :   jail  Barriers For Discharge :   Consults  :  none  Procedures  :   DVT Prophylaxis  :  Lovenox -  SCDs   Lab Results  Component Value Date   PLT 210 10/06/2015    Antibiotics  :    Anti-infectives    None        Objective:   Vitals:   10/05/15 2200 10/05/15 2300 10/06/15 0000 10/06/15 0100  BP: (!) 190/112 (!) 200/99 (!) 177/164 (!) 160/91  Pulse:  (!) 57 (!) 56 (!) 58  Resp: (!) 24 (!) 30 (!) 27 (!) 21  Temp:   99.9 F (37.7 C)  TempSrc:   Oral   SpO2:  100% 100% 100%  Weight:      Height:        Wt Readings from Last 3 Encounters:  10/05/15 52.8 kg (116 lb 6.5 oz)  10/25/14 72.6 kg (160 lb)  05/19/14 74.4 kg (164 lb)     Intake/Output Summary (Last 24 hours) at 10/06/15 0659 Last data filed at 10/06/15 0100  Gross per 24 hour    Intake          1108.33 ml  Output              263 ml  Net           845.33 ml     Physical Exam  Awake Alert, Oriented X 3, No new F.N deficits, Normal affect [place].AT,PERRAL Supple Neck,No JVD, No cervical lymphadenopathy appriciated.  Symmetrical Chest wall movement, Good air movement bilaterally, CTAB RRR,No Gallops,Rubs or new Murmurs, No Parasternal Heave +ve B.Sounds, Abd Soft, No tenderness, No organomegaly appriciated, No rebound - guarding or rigidity. No Cyanosis, Clubbing or edema, No new Rash or bruise      Data Review:    CBC  Recent Labs Lab 10/05/15 1343 10/06/15 0442  WBC 17.1* 11.9*  HGB 17.7* 14.9  HCT 48.7* 42.7  PLT 268 210  MCV 86.7 88.0  MCH 31.5 30.7  MCHC 36.3* 34.9  RDW 12.6 12.6    Chemistries   Recent Labs Lab 10/05/15 1343 10/05/15 1424 10/06/15 0442  NA 142  --  139  K 3.1*  --  3.2*  CL 103  --  108  CO2 26  --  24  GLUCOSE 129*  --  103*  BUN 19  --  18  CREATININE 0.90  --  0.82  CALCIUM 9.6  --  8.6*  MG  --  2.4  --   AST 25  --  20  ALT 64*  --  42  ALKPHOS 116  --  87  BILITOT 1.5*  --  1.3*   ------------------------------------------------------------------------------------------------------------------ No results for input(s): CHOL, HDL, LDLCALC, TRIG, CHOLHDL, LDLDIRECT in the last 72 hours.  No results found for: HGBA1C ------------------------------------------------------------------------------------------------------------------  Recent Labs  10/05/15 1424  TSH 0.256*   ------------------------------------------------------------------------------------------------------------------ No results for input(s): VITAMINB12, FOLATE, FERRITIN, TIBC, IRON, RETICCTPCT in the last 72 hours.  Coagulation profile  Recent Labs Lab 10/05/15 1343  INR 1.01    No results for input(s): DDIMER in the last 72 hours.  Cardiac Enzymes  Recent Labs Lab 10/05/15 1424  TROPONINI 0.05*    ------------------------------------------------------------------------------------------------------------------ No results found for: BNP  Inpatient Medications  Scheduled Meds: . cloNIDine  0.1 mg Oral TID  . enoxaparin (LOVENOX) injection  40 mg Subcutaneous Q24H  . folic acid  1 mg Intravenous Daily  . nicotine  14 mg Transdermal Daily  . pneumococcal 23 valent vaccine  0.5 mL Intramuscular Tomorrow-1000  . potassium chloride  40 mEq Oral Once  . sodium chloride flush  3 mL Intravenous Q12H  . thiamine  100 mg Intravenous Daily   Continuous Infusions:  PRN Meds:.acetaminophen **OR** acetaminophen, hydrALAZINE, LORazepam, ondansetron **OR** ondansetron (ZOFRAN) IV  Micro Results Recent Results (from the past 240 hour(s))  MRSA PCR Screening     Status: None   Collection Time: 10/05/15  5:35 PM  Result Value Ref Range Status   MRSA by PCR NEGATIVE NEGATIVE Final    Comment:        The GeneXpert MRSA Assay (FDA approved  for NASAL specimens only), is one component of a comprehensive MRSA colonization surveillance program. It is not intended to diagnose MRSA infection nor to guide or monitor treatment for MRSA infections.     Radiology Reports Dg Chest 1 View  Result Date: 10/05/2015 CLINICAL DATA:  Leukocytosis and altered mental state EXAM: CHEST 1 VIEW COMPARISON:  None. FINDINGS: The heart size and mediastinal contours are within normal limits. Both lungs are clear. The visualized skeletal structures are unremarkable. IMPRESSION: No active disease. Electronically Signed   By: Marlan Palau M.D.   On: 10/05/2015 18:02   Ct Head Wo Contrast  Result Date: 10/05/2015 CLINICAL DATA:  Altered mental status, possible seizure. EXAM: CT HEAD WITHOUT CONTRAST TECHNIQUE: Contiguous axial images were obtained from the base of the skull through the vertex without intravenous contrast. COMPARISON:  None. FINDINGS: Brain: No acute intracranial abnormality. Specifically, no  hemorrhage, hydrocephalus, mass lesion, acute infarction, or significant intracranial injury. Vascular: No hyperdense vessel or unexpected calcification. Skull: No acute calvarial abnormality. Sinuses/Orbits: Visualized paranasal sinuses and mastoids clear. Orbital soft tissues unremarkable. Other: None IMPRESSION: No acute intracranial abnormality. Electronically Signed   By: Charlett Nose M.D.   On: 10/05/2015 15:24   US Abdomen Limited Ruq  Result Date: 10/05/2015 CLINICAL DATA:  Drug withdrawal.  Right abdominal pain for 2 days. EXAM: US ABDOMEN LIMITED - RIGHT UPPER QUADRANT COMPARISON:  None. FINDINGS: Gallbladder: Surgically absent, removed on 06/22/2008. Common bile duct: Diameter: 3 mm Liver: Moderate intrahepatic biliary dilatation, I do not see a focal liver lesion. IMPRESSION: 1. Abnormal intrahepatic biliary dilatation without extrahepatic biliary dilatation. I do not see a definite lesion to be causing obstruction, but in combination of the patient's elevated liver function tests and hyperbilirubinemia, the possibility of an otherwise occult obstructive process must be considered. Typically I would recommend MRI of the liver with and without contrast using MRCP protocol to assess for a [place] tumor or other cause for potential obstruction. However, the patient had extreme difficulty holding still during the ultrasound, and had difficulty cooperating, and I worry that we would not be able to obtain a diagnostic MRI due to the required breath-holding and stationary positioning. If upon assessment the patient seems unlikely to be able to hold still at MRI, consider dedicated hepatic protocol CT with and without contrast for further workup. Electronically Signed   By: Gaylyn Rong M.D.   On: 10/05/2015 18:26    Time Spent in minutes  30   Pearson Grippe M.D on 10/06/2015 at 6:59 AM  Between 7am to 7pm - Pager - [phone]  After 7pm go to [email or web address] - password [facility]  [facility] -   Office  [phone]

## 2015-10-06 NOTE — Progress Notes (Signed)
Pt is in no obvious or stated distress. Co. Officer is with patient at all times.

## 2015-10-06 NOTE — Progress Notes (Signed)
*  PRELIMINARY RESULTS* Echocardiogram 2D Echocardiogram has been performed.  Stacey DrainWhite, Khyri Hinzman J 10/06/2015, 4:13 PM

## 2015-10-06 NOTE — Progress Notes (Signed)
Patient having frequent episodes of nausea and vomiting. Also having water stools. R/o cdiff pending. Pt admits to taking 4mg  dilaudid tablets 2 x daily that she gets off the street. Urine tox also pos for cocaine. CiWA score 10, bp elevated. Ativan 2mg  and hydraulizine given. Will cont to monitor vs and n/v.

## 2015-10-07 ENCOUNTER — Inpatient Hospital Stay (HOSPITAL_COMMUNITY)

## 2015-10-07 LAB — COMPREHENSIVE METABOLIC PANEL
ALBUMIN: 2.9 g/dL — AB (ref 3.5–5.0)
ALT: 32 U/L (ref 14–54)
ANION GAP: 4 — AB (ref 5–15)
AST: 17 U/L (ref 15–41)
Alkaline Phosphatase: 67 U/L (ref 38–126)
BUN: 12 mg/dL (ref 6–20)
CHLORIDE: 111 mmol/L (ref 101–111)
CO2: 24 mmol/L (ref 22–32)
Calcium: 8.1 mg/dL — ABNORMAL LOW (ref 8.9–10.3)
Creatinine, Ser: 0.68 mg/dL (ref 0.44–1.00)
GFR calc Af Amer: >60 mL/min (ref >60)
GFR calc non Af Amer: >60 mL/min (ref >60)
GLUCOSE: 93 mg/dL (ref 65–99)
POTASSIUM: 3.4 mmol/L — AB (ref 3.5–5.1)
SODIUM: 139 mmol/L (ref 135–145)
TOTAL PROTEIN: 5.5 g/dL — AB (ref 6.5–8.1)
Total Bilirubin: 1.5 mg/dL — ABNORMAL HIGH (ref 0.3–1.2)

## 2015-10-07 LAB — HEPATITIS PANEL, ACUTE
HCV Ab: >11 s/co ratio — ABNORMAL HIGH (ref 0.0–0.9)
HEP A IGM: NEGATIVE
HEP B C IGM: NEGATIVE
Hepatitis B Surface Ag: NEGATIVE

## 2015-10-07 LAB — CBC
HCT: 37.8 % (ref 36.0–46.0)
HEMOGLOBIN: 13.2 g/dL (ref 12.0–15.0)
MCH: 30.8 pg (ref 26.0–34.0)
MCHC: 34.9 g/dL (ref 30.0–36.0)
MCV: 88.3 fL (ref 78.0–100.0)
PLATELETS: 148 10*3/uL — AB (ref 150–400)
RBC: 4.28 MIL/uL (ref 3.87–5.11)
RDW: 12 % (ref 11.5–15.5)
WBC: 6.2 10*3/uL (ref 4.0–10.5)

## 2015-10-07 LAB — URINALYSIS, ROUTINE W REFLEX MICROSCOPIC
Bilirubin Urine: NEGATIVE
GLUCOSE, UA: NEGATIVE mg/dL
Hgb urine dipstick: NEGATIVE
KETONES UR: NEGATIVE mg/dL
LEUKOCYTES UA: NEGATIVE
NITRITE: NEGATIVE
PROTEIN: NEGATIVE mg/dL
Specific Gravity, Urine: <1.005 — ABNORMAL LOW (ref 1.005–1.030)
pH: 6 (ref 5.0–8.0)

## 2015-10-07 LAB — PROCALCITONIN: Procalcitonin: <0.1 ng/mL

## 2015-10-07 LAB — GLUCOSE, CAPILLARY: Glucose-Capillary: 119 mg/dL — ABNORMAL HIGH (ref 65–99)

## 2015-10-07 LAB — AMMONIA: AMMONIA: 32 umol/L (ref 9–35)

## 2015-10-07 LAB — HIV ANTIBODY (ROUTINE TESTING W REFLEX): HIV Screen 4th Generation wRfx: NONREACTIVE

## 2015-10-07 LAB — RPR: RPR Ser Ql: NONREACTIVE

## 2015-10-07 MED ORDER — POTASSIUM CHLORIDE CRYS ER 20 MEQ PO TBCR
20.0000 meq | EXTENDED_RELEASE_TABLET | Freq: Once | ORAL | Status: AC
  Administered 2015-10-07: 20 meq via ORAL
  Filled 2015-10-07: qty 1

## 2015-10-07 MED ORDER — BOOST / RESOURCE BREEZE PO LIQD
1.0000 | Freq: Three times a day (TID) | ORAL | Status: DC
  Administered 2015-10-08 – 2015-10-12 (×14): 1 via ORAL

## 2015-10-07 NOTE — Clinical Social Work Note (Signed)
Clinical Social Work Assessment  Patient Details  Name: Jamie Evans MRN: [id number] Date of Birth: 10/31/1970  Date of referral:  10/07/15               Reason for consult:  Substance Use/ETOH Abuse                Permission sought to share information with:    Permission granted to share information::     Name::        Agency::     Relationship::     Contact Information:     Housing/Transportation Living arrangements for the past 2 months:  Single Family Home Source of Information:  Patient Patient Interpreter Needed:  None Criminal Activity/Legal Involvement Pertinent to Current Situation/Hospitalization:  No - Comment as needed Significant Relationships:  Significant Other, Adult Children Lives with:  Spouse, Significant Other, Adult Children Do you feel safe going back to the place where you live?  Yes Need for family participation in patient care:  Yes (Comment)  Care giving concerns:  None identified.    Social Worker assessment / plan:  CSW discussed the reason for the referral with patient. Patient indicated that she does not have a SA issue. CSW discussed the positive urine drug screen on 10/05/15 for cocaine and opiates.  Patient advised that the opiates are from her pain mediation. She stated that she must have tested positive for cocaine due to being around her boyfriend who was smoking cocaine. Patient went on to say "I've been trying to get him to stop doing that shit." CSW discussed that being around someone smoking cocaine would not yield a positive drug screen. Patient indicated that she did not remember what happened that would make her test positive for cocaine.  CSW offered information on information on SA treatment and the referenced substances.  Patient was accepting of the information, however indicated that she did not desire SA treatment.  CSW signing off.   Employment status:  Unemployed Health and safety inspectornsurance information:  Self Pay (Medicaid Pending) PT  Recommendations:    Information / Referral to community resources:  Outpatient Substance Abuse Treatment Options  Patient/Family's Response to care:  Patient denies a current SA issue and declines SA treatment.   Patient/Family's Understanding of and Emotional Response to Diagnosis, Current Treatment, and Prognosis:  Patient has poor understanding of her diagnosis. She advised that she did not know why she was in the hospital.   Emotional Assessment Appearance:  Appears stated age Attitude/Demeanor/Rapport:   (Cooperative) Affect (typically observed):  Calm Orientation:  Oriented to Situation, Oriented to  Time, Oriented to Place, Oriented to Self Alcohol / Substance use:    Psych involvement (Current and /or in the community):  No (Comment)  Discharge Needs  Concerns to be addressed:  Substance Abuse Concerns Readmission within the last 30 days:  No Current discharge risk:  Substance Abuse Barriers to Discharge:  No Barriers Identified   Annice NeedySettle, Janelle Culton D, LCSW 10/07/2015, 9:38 AM

## 2015-10-07 NOTE — Progress Notes (Signed)
Contacted [facility] in order to see around what time will they be here to assess patient. Patient is ready to eat and have her diet advanced. Speech noted that they will be here around 1700.   Genelle Balameron D Sankalp Ferrell, RN
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
## 2015-10-07 NOTE — Care Management Note (Signed)
Case Management Note  Patient Details  Name: Jamie Evans MRN: [id number] Date of Birth: 01/21/1971  Expected Discharge Date:    10/08/2015              Expected Discharge Plan:  Home/Self Care  In-House Referral:  Financial Counselor  Discharge planning Services  CM Consult  Post Acute Care Choice:  NA Choice offered to:  NA  DME Arranged:    DME Agency:     HH Arranged:    HH Agency:     Status of Service:  Completed, signed off  If discussed at [employer] Length of Stay Meetings, dates discussed:    Additional Comments: Pt has posted bail and will be discharging home at discharge. [facility] medicaid and [facility]. She lives at home with her husband and daughter. She is ind with ADL's. She has PCP, transportation to appointments and no difficulty affording her medications. She plans to return home at [place].  Malcolm Metrohildress, Fay Swider Demske, RN 10/07/2015, 3:24 PM

## 2015-10-07 NOTE — Progress Notes (Signed)
Initial Nutrition Assessment    INTERVENTION:  Clear liquids currently-but pt is wanting to eat regular food   ST to evaluate for diet-texture/consistentcy recommendations  While diet advancement is pending start: Boost Breeze po TID, each supplement provides 250 kcal and 9 grams of protein    NUTRITION DIAGNOSIS:   Inadequate oral intake related to lethargy/confusion as evidenced by   pt reported condition on admission and clear liquid diet   GOAL:   Patient will meet greater than or equal to 90% of their needs  MONITOR:   Labs, Weight trends, Supplement acceptance, Diet advancement, PO intake  REASON FOR ASSESSMENT:   Malnutrition Screening Tool    ASSESSMENT:  [name] with medical history of mental disorder, substance abuse who presents to the ED with altered mental status.   Her daughters are here but aren't able to help much at all with her diet hx except to say that she hasn't been eating. According to hospital records she has severe wt loss 22% compared to last year around this time.  She is on CIWA protocol and not been alert enough to provide hx herself. Her diet is clear liquids and she has eaten the jello but doesn't like the broth. Daughters say that their mom is wanting some "real food". SLP has been consulted for swallow screen then hopefully she'll be able to advance her diet more fully. Strongly suspect the woman is malnourished but unable to establish based on limited data at this time. Will continue to follow.    Recent Labs Lab 10/05/15 1343 10/05/15 1424 10/06/15 0442 10/07/15 0409  NA 142  --  139 139  K 3.1*  --  3.2* 3.4*  CL 103  --  108 111  CO2 26  --  24 24  BUN 19  --  18 12  CREATININE 0.90  --  0.82 0.68  CALCIUM 9.6  --  8.6* 8.1*  MG  --  2.4  --   --   GLUCOSE 129*  --  103* 93   Labs: potassium- improving  Meds/vitamin supplements: thiamin, folic acid, Ativan   Unable to complete Nutrition-Focused physical exam at  this time.  Patient is not awake enough yet to participate  Diet Order:  Diet clear liquid Room service appropriate? Yes; Fluid consistency: Thin  Skin:  Reviewed, no issues  Last BM:  10/07/15  Height:   Ht Readings from Last 1 Encounters:  10/05/15 5\' 4"  (1.626 m)    Weight:   Wt Readings from Last 1 Encounters:  10/07/15 123 lb 10.9 oz (56.1 kg)    Ideal Body Weight:  54.5 kg  BMI:  Body mass index is 21.23 kg/m.  Estimated Nutritional Needs:   Kcal:   1650-1880   Protein:   84-90 gr  Fluid:   1.7-1.9 liters daily  EDUCATION NEEDS:   No education needs identified at this time   Royann ShiversLynn Jazzelle Zhang MS,RD,CSG,LDN Office: #[phone] Pager: [phone]

## 2015-10-07 NOTE — Progress Notes (Signed)
PROGRESS NOTE    Jamie Evans  UJW:[id number] DOB: 1970-06-20 DOA: 10/05/2015 PCP: [facility] (Inactive)   Brief Narrative:  45 y.o.femalewith medical history significant for anxiety, cholelithiasis, and substance abuse who presents to the emergency department from jail with altered mental status. Patient was reportedly taken into custody on 10/03/2015 and was alert and fully oriented at that time. The following day, she was found to be very lethargic and poorly responsive, and apparently incontinent of stool. She persisted in this state throughout the day yesterday and today and was brought in for evaluation of this. History is obtained through discussion with corrections officer's, ED personnel, and review of the EMR. There was suspicion at the jail but the patient had had a seizure given her incontinence, but there was no seizure-like activity witnessed. Corrections officer's have very low suspicion for ingestion or trauma during custody. It is not known whether the patient has been using alcohol in excess prior to the incarceration, but the corrections officer reports that this would be consistent with the rest of her family's behavior. Jamie Evans has history of anxiety and had been prescribed benzodiazepine in the past.  In the ED, patient is found to be be afebrile, saturating well on room air, hypertensive to the 180/110 range, and with vitals otherwise stable. EKG demonstrates a sinus rhythm with LVH by voltage criteria and nonspecific T-wave abnormalities. Head CT is negative for acute intracranial abnormality. CMP features a serum potassium of 3.1, ALT mildly elevated 64, and mild elevation in total bilirubin 1.5. Ethanol level is undetectable, ammonia levels mildly elevated 44, and CK elevated at 4:30. CBC is notable for a leukocytosis to 17,100 and a polycythemia with hemoglobin of 17.7. INR is within the normal limits, UDS is positive for opiates and cocaine,  and urinalysis features many bacteria, but no leukocyte or nitrite, and no significant WBC. A 1 L normal saline bolus was administered in the emergency department and the patient's condition remained largely unchanged. She was noted to have anepisode of large volume diarrhea while in the ED. She has remained hemodynamically stable and will be observed in the stepdown unit for ongoing evaluation and management of alteration in mental status with various lab work abnormalities as described above.  Assessment & Plan:   Principal Problem:   Acute encephalopathy Active Problems:   Hypokalemia   Leukocytosis   Polycythemia   Hyperbilirubinemia   Hypertension   Substance abuse   Elevated LFTs   Acute encephalopathy  Improving per patient's daughters whom are bedside MRI Brain yesterday showed Abnormal cortical and subcortical edema of the cerebral hemispheres posteriorly consistent with posterior reversible encephalopathy. No evidence of hemorrhage or mass effect. Ammonia level pending Still having occasional hallucinations  Leukocytosis  Improved   Continue to monitor for signs of infection  Hypertension  Possible secondary to withdrawal Patient BP better since starting clonidine 0.1mg  po tid   Substance abuse  UDS positive for cocaine and opiates; puncture wounds noted over UE veins  Possible withdrawal complicating clinical presentation  Uncertain alcohol-history Continue CIWA and PRN ativan SW consulted   Polycythemia  Improved with IVF  Hypokalemia  Likely secondary to GI-losses  Improved potassium with oral Kcl Repeat PO Kcl Recheck CMP in am  Diarrhea  Opiate withdrawal suspected Stool studies negative Continue to monitor electrolytes   Tobacco use Nicotine patch  Abnormal lft Per order patient refused MRCP Will order abdominal ultrasound today for elevated bilirubin 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 Secondary to coccaine use Echo results- Normal LV wall thickness with LVEF 65-70%. Grade 2 diastolic dysfunction with increased LV filling pressure. Mild left atrial enlargement. Trivial mitral regurgitation. Mildly calcified aortic valve. Trivial tricuspid regurgitation.   DVT prophylaxis: Lovenox, SCD's Code Status: Full Code Family Communication: Daughters and friend bedside Disposition Plan: Jail when medically stable and improved   Consultants:   Social Work  Procedures:   none  Antimicrobials:   none    Subjective: Patient awake and alert (x2 today- person, place not to time or situation).  Daughters and patient's friend were bedside.  Patient reports she feels better today and her daughters concur patient is better today- more lucid.  Patient cannot recall why she is in the hospital.  She voices that she is still having some diarrhea and is experiencing burning with urination.  Asking if she can eat something today.  Patient voices she is very hungry.    Objective: Vitals:   10/07/15 0600 10/07/15 0700 10/07/15 0758 10/07/15 0800  BP: (!) 131/94 (!) 150/93  (!) 138/97  Pulse: 67 69  90  Resp: (!) 21 (!) 0  (!) 24  Temp:   98 F (36.7 C)   TempSrc:   Oral   SpO2: 98% 92%  (!) 88%  Weight:      Height:        Intake/Output Summary (Last 24 hours) at 10/07/15 0950 Last data filed at 10/06/15 1804  Gross per 24 hour  Intake          1383.33 ml  Output                0 ml  Net          1383.33 ml   Filed Weights   10/05/15 1335 10/05/15 1733 10/07/15 0500  Weight: 72.6 kg (160 lb) 52.8 kg (116 lb 6.5 oz) 56.1 kg (123 lb 10.9 oz)    Examination:  General exam: Appears calm and comfortable  Respiratory system: Clear to auscultation. Respiratory effort normal. Cardiovascular system: S1 & S2 heard, RRR. No JVD, murmurs, rubs,  gallops or clicks. No pedal edema. Gastrointestinal system: Abdomen is nondistended, soft and nontender. No organomegaly or masses felt. Normal bowel sounds heard. Central nervous system: Alert and oriented x 2. No focal neurological deficits. Some asterixus (daughter states this is improved), CN II-XII GI Extremities: Symmetric 5 x 5 power. Skin: No rashes, few lesions noted on forearms bilaterally Psychiatry: Judgement and insight appear normal. Mood & affect appropriate.     Data Reviewed: I have personally reviewed following labs and imaging studies  CBC:  Recent Labs Lab 10/05/15 1343 10/06/15 0442 10/07/15 0409  WBC 17.1* 11.9* 6.2  HGB 17.7* 14.9 13.2  HCT 48.7* 42.7 37.8  MCV 86.7 88.0 88.3  PLT 268 210 148*   Basic Metabolic Panel:  Recent Labs Lab 10/05/15 1343 10/05/15 1424 10/06/15 0442 10/07/15 0409  NA 142  --  139 139  K 3.1*  --  3.2* 3.4*  CL 103  --  108 111  CO2 26  --  24 24  GLUCOSE 129*  --  103* 93  BUN 19  --  18 12  CREATININE 0.90  --  0.82 0.68  CALCIUM 9.6  --  8.6* 8.1*  MG  --  2.4  --   --    GFR: Estimated Creatinine Clearance: 76.7 mL/min (by C-G formula based on SCr of 0.8 mg/dL). Liver Function Tests:  Recent Labs Lab 10/05/15 1343 10/06/15 0442 10/07/15 0409  AST 25 20 17   ALT 64* 42 32  ALKPHOS 116 87 67  BILITOT 1.5* 1.3* 1.5*  PROT 8.3* 6.7 5.5*  ALBUMIN 4.5 3.6 2.9*   No results for input(s): LIPASE, AMYLASE in the last 168 hours.  Recent Labs Lab 10/05/15 1424  AMMONIA 44*   Coagulation Profile:  Recent Labs Lab 10/05/15 1343  INR 1.01   Cardiac Enzymes:  Recent Labs Lab 10/05/15 1424 10/06/15 0442  CKTOTAL 430* 170  TROPONINI 0.05* 0.03*   BNP (last 3 results) No results for input(s): PROBNP in the last 8760 hours. HbA1C: No results for input(s): HGBA1C in the last 72 hours. CBG:  Recent Labs Lab 10/05/15 1339 10/06/15 0728 10/07/15 0741  GLUCAP 118* 95 119*   Lipid Profile: No  results for input(s): CHOL, HDL, LDLCALC, TRIG, CHOLHDL, LDLDIRECT in the last 72 hours. Thyroid Function Tests:  Recent Labs  10/05/15 0500 10/05/15 1424  TSH  --  0.256*  FREET4 1.12  --    Anemia Panel:  Recent Labs  10/05/15 0500  VITAMINB12 337   Sepsis Labs:  Recent Labs Lab 10/05/15 1424 10/05/15 1712 10/05/15 2300 10/07/15 0409  PROCALCITON <0.10  --   --  <0.10  LATICACIDVEN  --  2.2* 1.1  --     Recent Results (from the past 240 hour(s))  MRSA PCR Screening     Status: None   Collection Time: 10/05/15  5:35 PM  Result Value Ref Range Status   MRSA by PCR NEGATIVE NEGATIVE Final    Comment:        The GeneXpert MRSA Assay (FDA approved for NASAL specimens only), is one component of a comprehensive MRSA colonization surveillance program. It is not intended to diagnose MRSA infection nor to guide or monitor treatment for MRSA infections.   Gastrointestinal Panel by PCR , Stool     Status: None   Collection Time: 10/05/15 10:15 PM  Result Value Ref Range Status   Campylobacter species NOT DETECTED NOT DETECTED Final   Plesimonas shigelloides NOT DETECTED NOT DETECTED Final   Salmonella species NOT DETECTED NOT DETECTED Final   Yersinia enterocolitica NOT DETECTED NOT DETECTED Final   Vibrio species NOT DETECTED NOT DETECTED Final   Vibrio cholerae NOT DETECTED NOT DETECTED Final   Enteroaggregative E coli (EAEC) NOT DETECTED NOT DETECTED Final   Enteropathogenic E coli (EPEC) NOT DETECTED NOT DETECTED Final   Enterotoxigenic E coli (ETEC) NOT DETECTED NOT DETECTED Final   Shiga like toxin producing E coli (STEC) NOT DETECTED NOT DETECTED Final   E. coli O157 NOT DETECTED NOT DETECTED Final   Shigella/Enteroinvasive E coli (EIEC) NOT DETECTED NOT DETECTED Final   Cryptosporidium NOT DETECTED NOT DETECTED Final   Cyclospora cayetanensis NOT DETECTED NOT DETECTED Final   Entamoeba histolytica NOT DETECTED NOT DETECTED Final   Giardia lamblia NOT  DETECTED NOT DETECTED Final   Adenovirus F40/41 NOT DETECTED NOT DETECTED Final   Astrovirus NOT DETECTED NOT DETECTED Final   Norovirus GI/GII NOT DETECTED NOT DETECTED Final   Rotavirus A NOT DETECTED NOT DETECTED Final   Sapovirus (I, II, IV, and V) NOT DETECTED NOT DETECTED Final         Radiology Studies: Dg Chest 1 View  Result Date: 10/05/2015 CLINICAL DATA:  Leukocytosis and altered mental state EXAM: CHEST 1 VIEW COMPARISON:  None. FINDINGS: The heart size and mediastinal contours are within normal limits. Both lungs are clear. The visualized skeletal structures are unremarkable. IMPRESSION: No active disease. Electronically Signed   By: Marlan Palauharles  Clark M.D.   On: 10/05/2015 18:02   Ct Head Wo Contrast  Result Date: 10/05/2015 CLINICAL DATA:  Altered mental status, possible seizure. EXAM: CT HEAD WITHOUT CONTRAST TECHNIQUE: Contiguous axial images were obtained from the base of the skull through the vertex without intravenous contrast. COMPARISON:  None. FINDINGS: Brain: No acute intracranial abnormality. Specifically, no hemorrhage, hydrocephalus, mass lesion, acute infarction, or significant intracranial injury. Vascular: No hyperdense vessel or unexpected calcification. Skull: No acute calvarial abnormality. Sinuses/Orbits: Visualized paranasal sinuses and mastoids clear. Orbital soft tissues unremarkable. Other: None IMPRESSION: No acute intracranial abnormality. Electronically Signed   By: Charlett NoseKevin  Dover M.D.   On: 10/05/2015 15:24   Mr Brain Wo Contrast  Result Date: 10/06/2015 CLINICAL DATA:  Altered mental status beginning 2 days ago with possible seizure. EXAM: MRI HEAD WITHOUT CONTRAST TECHNIQUE: Multiplanar, multiecho pulse sequences of the brain and surrounding structures were obtained without intravenous contrast. COMPARISON:  Head CT 10/05/2015 FINDINGS: Brain: Diffusion imaging does not show any acute infarction. The brainstem and cerebellum are normal. There is abnormal  cortical and subcortical edema in the occipital and posterior parietal regions likely secondary to posterior reversible encephalopathy. No sign of hemorrhage. The remainder the brain is normal. No mass effect. No hydrocephalus. No extra-axial collection. Vascular: Major vessels at the base of the brain show flow. Skull and upper cervical spine: Negative Sinuses/Orbits: Clear/normal Other: None significant IMPRESSION: Abnormal cortical and subcortical edema of the cerebral hemispheres posteriorly consistent with posterior reversible encephalopathy. No evidence of hemorrhage or mass effect. Electronically Signed   By: Paulina FusiMark  Shogry M.D.   On: 10/06/2015 10:36   [place] Abdomen Limited Ruq  Result Date: 10/05/2015 CLINICAL DATA:  Drug withdrawal.  Right abdominal pain for 2 days. EXAM: US ABDOMEN LIMITED - RIGHT UPPER QUADRANT COMPARISON:  None. FINDINGS: Gallbladder: Surgically absent, removed on 06/22/2008. Common bile duct: Diameter: 3 mm Liver: Moderate intrahepatic biliary dilatation, I do not see a focal liver lesion. IMPRESSION: 1. Abnormal intrahepatic biliary dilatation without extrahepatic biliary dilatation. I do not see a definite lesion to be causing obstruction, but in combination of the patient's elevated liver function tests and hyperbilirubinemia, the possibility of an otherwise occult obstructive process must be considered. Typically I would recommend MRI of the liver with and without contrast using MRCP protocol to assess for a [place] tumor or other cause for potential obstruction. However, the patient had extreme difficulty holding still during the ultrasound, and had difficulty cooperating, and I worry that we would not be able to obtain a diagnostic MRI due to the required breath-holding and stationary positioning. If upon assessment the patient seems unlikely to be able to hold still at MRI, consider dedicated hepatic protocol CT with and without contrast for further workup. Electronically Signed    By: Gaylyn RongWalter  Liebkemann M.D.   On: 10/05/2015 18:26        Scheduled Meds: . cloNIDine  0.1 mg Oral TID  . enoxaparin (LOVENOX) injection  40 mg Subcutaneous Q24H  . folic acid  1 mg Intravenous Daily  . nicotine  14 mg Transdermal Daily  . pneumococcal 23 valent vaccine  0.5 mL Intramuscular Tomorrow-1000  . sodium chloride flush  3 mL Intravenous Q12H  . thiamine  100 mg Intravenous  Daily   Continuous Infusions:    LOS: 1 day    Time spent: 35 minutes    Bennett Scrape, MD [facility] Pager [phone]  If 7PM-7AM, please contact night-coverage [email or web address] Password [email or web address] 10/07/2015, 9:50 AM

## 2015-10-08 LAB — GLUCOSE, CAPILLARY: GLUCOSE-CAPILLARY: 113 mg/dL — AB (ref 65–99)

## 2015-10-08 LAB — COMPREHENSIVE METABOLIC PANEL
ALT: 28 U/L (ref 14–54)
AST: 16 U/L (ref 15–41)
Albumin: 2.9 g/dL — ABNORMAL LOW (ref 3.5–5.0)
Alkaline Phosphatase: 64 U/L (ref 38–126)
Anion gap: 4 — ABNORMAL LOW (ref 5–15)
BUN: 11 mg/dL (ref 6–20)
CO2: 24 mmol/L (ref 22–32)
CREATININE: 0.74 mg/dL (ref 0.44–1.00)
Calcium: 8.1 mg/dL — ABNORMAL LOW (ref 8.9–10.3)
Chloride: 112 mmol/L — ABNORMAL HIGH (ref 101–111)
Glucose, Bld: 97 mg/dL (ref 65–99)
Potassium: 3 mmol/L — ABNORMAL LOW (ref 3.5–5.1)
Sodium: 140 mmol/L (ref 135–145)
TOTAL PROTEIN: 5.5 g/dL — AB (ref 6.5–8.1)
Total Bilirubin: 0.6 mg/dL (ref 0.3–1.2)

## 2015-10-08 LAB — FOLATE RBC
Folate, Hemolysate: 499.4 ng/mL
Folate, RBC: 1164 ng/mL (ref >498)
Hematocrit: 42.9 % (ref 34.0–46.6)

## 2015-10-08 LAB — VITAMIN B1: Vitamin B1 (Thiamine): 162.5 nmol/L (ref 66.5–200.0)

## 2015-10-08 MED ORDER — POTASSIUM CHLORIDE CRYS ER 20 MEQ PO TBCR
40.0000 meq | EXTENDED_RELEASE_TABLET | Freq: Two times a day (BID) | ORAL | Status: DC
  Administered 2015-10-08 – 2015-10-13 (×10): 40 meq via ORAL
  Filled 2015-10-08 (×10): qty 2

## 2015-10-08 MED ORDER — NICOTINE 14 MG/24HR TD PT24
14.0000 mg | MEDICATED_PATCH | Freq: Every day | TRANSDERMAL | Status: DC
  Administered 2015-10-08 – 2015-10-13 (×6): 14 mg via TRANSDERMAL
  Filled 2015-10-08 (×6): qty 1

## 2015-10-08 NOTE — Progress Notes (Signed)
Patient resting in bed with eyes closed. Saline lock is patent. Vital signs are stable. No complaints of any distress at this time. Report given to Benjaman LobeMegan Bullins, RN. Patient to be transferred to room 333 via wheelchair.

## 2015-10-08 NOTE — Progress Notes (Signed)
PROGRESS NOTE    Jamie Evans  MRN:[id number] DOB: 01/31/1970 DOA: 10/05/2015 PCP: [facility] (Inactive)   Brief Narrative:  45 y.o.femalewith medical history significant for anxiety, cholelithiasis, and substance abuse who presents to the emergency department from jail with altered mental status. Patient was reportedly taken into custody on 10/03/2015 and was alert and fully oriented at that time. The following day, she was found to be very lethargic and poorly responsive, and apparently incontinent of stool. She persisted in this state throughout the day yesterday and today and was brought in for evaluation of this. History is obtained through discussion with corrections officer's, ED personnel, and review of the EMR. There was suspicion at the jail but the patient had had a seizure given her incontinence, but there was no seizure-like activity witnessed. Corrections officer's have very low suspicion for ingestion or trauma during custody. It is not known whether the patient has been using alcohol in excess prior to the incarceration, but the corrections officer reports that this would be consistent with the rest of her family's behavior. Jamie Evans has history of anxiety and had been prescribed benzodiazepine in the past.  In the ED, patient is found to be be afebrile, saturating well on room air, hypertensive to the 180/110 range, and with vitals otherwise stable. EKG demonstrates a sinus rhythm with LVH by voltage criteria and nonspecific T-wave abnormalities. Head CT is negative for acute intracranial abnormality. CMP features a serum potassium of 3.1, ALT mildly elevated 64, and mild elevation in total bilirubin 1.5. Ethanol level is undetectable, ammonia levels mildly elevated 44, and CK elevated at 4:30. CBC is notable for a leukocytosis to 17,100 and a polycythemia with hemoglobin of 17.7. INR is within the normal limits, UDS is positive for opiates and cocaine,  and urinalysis features many bacteria, but no leukocyte or nitrite, and no significant WBC. A 1 L normal saline bolus was administered in the emergency department and the patient's condition remained largely unchanged. She was noted to have anepisode of large volume diarrhea while in the ED. She has remained hemodynamically stable and will be observed in the stepdown unit for ongoing evaluation and management of alteration in mental status with various lab work abnormalities as described above.  Assessment & Plan:   Principal Problem:   Acute encephalopathy Active Problems:   Hypokalemia   Leukocytosis   Polycythemia   Hyperbilirubinemia   Hypertension   Substance abuse   Elevated LFTs   Acute encephalopathy  Significantly improved today- patient A and O x 4 MRI Brain yesterday showed Abnormal cortical and subcortical edema of the cerebral hemispheres posteriorly consistent with posterior reversible encephalopathy. No evidence of hemorrhage or mass effect. Ammonia level decreased Daughters do not report any hallucinations  Dysuria UA negative yesterday Improved Will look into further etiologies if patient voices she has symptoms again  Leukocytosis  Improved   Continue to monitor for signs of infection  Hypertension  Possible secondary to withdrawal Patient BP better since starting clonidine 0.1mg  po tid  Continue clonidine and monitoring  Substance abuse  UDS positive for cocaine and opiates; puncture wounds noted over UE veins  Encephalopathy seems likely secondary to withdrawal  Uncertain alcohol-history Continue CIWA and PRN ativan- patient received consistent ativan dosing yesterday throughout the day for withdrawals SW consulted   Polycythemia  Improved with IVF  Hypokalemia  Likely secondary to GI-losses  Improved potassium with oral Kcl <MEASUREMEN  Diarrhea  Opiate withdrawal suspected Stool studies  negative Continue to monitor electrolytes   Tobacco use Nicotine patch  Abnormal lft Per order patient refused MRCP Will order abdominal ultrasound today for elevated bilirubin HCV positive- will follow up with HCV Nucleic Acid Amplification Test    CPK elevation CPK decreased from 430 to 170 Continue IVF- can stop today as patient tolerating PO  Abnormal TSH Will need to recheck tsh on 9/9  Trop elevation ? Secondary to coccaine use Echo results- Normal LV wall thickness with LVEF 65-70%. Grade 2 diastolic dysfunction with increased LV filling pressure. Mild left atrial enlargement. Trivial mitral regurgitation. Mildly calcified aortic valve. Trivial tricuspid regurgitation.   DVT prophylaxis: Lovenox, SCD's Code Status: Full Code Family Communication: Daughters bedside Disposition Plan: patient will be discharged home when medically stable- she states she was released from jail   Consultants:   Social Work  Procedures:   none  Antimicrobials:   none    Subjective: Patient awake, alert and oriented this morning.  She remembers yesterday, oriented x 4 this morning.  She is eating breakfast.  She voices her diarrhea is improving and that she no longer has any dysuria.  She is wondering about transferring upstairs as she wants to take a shower.  Daughters voice that their mother looks markedly improved from yesterday.  Patient informs me that she will not have to go to jail at time of discharge as she was cleared to just attend her court date.  Objective: Vitals:   10/08/15 0429 10/08/15 0500 10/08/15 0550 10/08/15 0600  BP: (!) 137/94   (!) 153/98  Pulse: 65 (!) 57 67 66  Resp: (!) 22 (!) 31  (!) 27  Temp:      TempSrc:      SpO2:  99%  99%  Weight:      Height:       No intake or output data in the 24 hours ending 10/08/15 0729 Filed Weights   10/05/15 1335 10/05/15 1733 10/07/15 0500  Weight: 72.6 kg (160 lb) 52.8 kg (116 lb 6.5 oz) 56.1 kg (123 lb  10.9 oz)    Examination:  General exam: Appears calm and comfortable  Respiratory system: Clear to auscultation. Respiratory effort normal. Cardiovascular system: S1 & S2 heard, RRR. No JVD, murmurs, rubs, gallops or clicks. No pedal edema. Gastrointestinal system: Abdomen is nondistended, soft and nontender. No organomegaly or masses felt. Normal bowel sounds heard. Central nervous system: Alert and oriented x 4. No focal neurological deficits. CN II-XII GI Extremities: Symmetric 5 x 5 power. Skin: No rashes, few lesions noted on forearms bilaterally Psychiatry: Judgement and insight appear normal. Mood & affect appropriate.     Data Reviewed: I have personally reviewed following labs and imaging studies  CBC:  Recent Labs Lab 10/05/15 1343 10/06/15 0442 10/07/15 0409  WBC 17.1* 11.9* 6.2  HGB 17.7* 14.9 13.2  HCT 48.7* 42.7 37.8  MCV 86.7 88.0 88.3  PLT 268 210 148*   Basic Metabolic Panel:  Recent Labs Lab 10/05/15 1343 10/05/15 1424 10/06/15 0442 10/07/15 0409 10/08/15 0400  NA 142  --  139 139 140  K 3.1*  --  3.2* 3.4* 3.0*  CL 103  --  108 111 112*  CO2 26  --  24 24 24   GLUCOSE 129*  --  103* 93 97  BUN 19  --  18 12 11   CREATININE 0.90  --  0.82 0.68 0.74  CALCIUM 9.6  --  8.6* 8.1* 8.1*  MG  --  2.4  --   --   --    GFR: Estimated Creatinine Clearance: 76.7 mL/min (by C-G formula based on SCr of 0.8 mg/dL). Liver Function Tests:  Recent Labs Lab 10/05/15 1343 10/06/15 0442 10/07/15 0409 10/08/15 0400  AST 25 20 17 16   ALT 64* 42 32 28  ALKPHOS 116 87 67 64  BILITOT 1.5* 1.3* 1.5* 0.6  PROT 8.3* 6.7 5.5* 5.5*  ALBUMIN 4.5 3.6 2.9* 2.9*   No results for input(s): LIPASE, AMYLASE in the last 168 hours.  Recent Labs Lab 10/05/15 1424 10/07/15 1039  AMMONIA 44* 32   Coagulation Profile:  Recent Labs Lab 10/05/15 1343  INR 1.01   Cardiac Enzymes:  Recent Labs Lab 10/05/15 1424 10/06/15 0442  CKTOTAL 430* 170  TROPONINI 0.05*  0.03*   BNP (last 3 results) No results for input(s): PROBNP in the last 8760 hours. HbA1C: No results for input(s): HGBA1C in the last 72 hours. CBG:  Recent Labs Lab 10/05/15 1339 10/06/15 0728 10/07/15 0741  GLUCAP 118* 95 119*   Lipid Profile: No results for input(s): CHOL, HDL, LDLCALC, TRIG, CHOLHDL, LDLDIRECT in the last 72 hours. Thyroid Function Tests:  Recent Labs  10/05/15 1424  TSH 0.256*   Anemia Panel: No results for input(s): VITAMINB12, FOLATE, FERRITIN, TIBC, IRON, RETICCTPCT in the last 72 hours. Sepsis Labs:  Recent Labs Lab 10/05/15 1424 10/05/15 1712 10/05/15 2300 10/07/15 0409  PROCALCITON <0.10  --   --  <0.10  LATICACIDVEN  --  2.2* 1.1  --     Recent Results (from the past 240 hour(s))  MRSA PCR Screening     Status: None   Collection Time: 10/05/15  5:35 PM  Result Value Ref Range Status   MRSA by PCR NEGATIVE NEGATIVE Final    Comment:        The GeneXpert MRSA Assay (FDA approved for NASAL specimens only), is one component of a comprehensive MRSA colonization surveillance program. It is not intended to diagnose MRSA infection nor to guide or monitor treatment for MRSA infections.   Gastrointestinal Panel by PCR , Stool     Status: None   Collection Time: 10/05/15 10:15 PM  Result Value Ref Range Status   Campylobacter species NOT DETECTED NOT DETECTED Final   Plesimonas shigelloides NOT DETECTED NOT DETECTED Final   Salmonella species NOT DETECTED NOT DETECTED Final   Yersinia enterocolitica NOT DETECTED NOT DETECTED Final   Vibrio species NOT DETECTED NOT DETECTED Final   Vibrio cholerae NOT DETECTED NOT DETECTED Final   Enteroaggregative E coli (EAEC) NOT DETECTED NOT DETECTED Final   Enteropathogenic E coli (EPEC) NOT DETECTED NOT DETECTED Final   Enterotoxigenic E coli (ETEC) NOT DETECTED NOT DETECTED Final   Shiga like toxin producing E coli (STEC) NOT DETECTED NOT DETECTED Final   E. coli O157 NOT DETECTED NOT  DETECTED Final   Shigella/Enteroinvasive E coli (EIEC) NOT DETECTED NOT DETECTED Final   Cryptosporidium NOT DETECTED NOT DETECTED Final   Cyclospora cayetanensis NOT DETECTED NOT DETECTED Final   Entamoeba histolytica NOT DETECTED NOT DETECTED Final   Giardia lamblia NOT DETECTED NOT DETECTED Final   Adenovirus F40/41 NOT DETECTED NOT DETECTED Final   Astrovirus NOT DETECTED NOT DETECTED Final   Norovirus GI/GII NOT DETECTED NOT DETECTED Final   Rotavirus A NOT DETECTED NOT DETECTED Final   Sapovirus (I, II, IV, and V) NOT DETECTED NOT DETECTED Final  Radiology Studies: Mr Brain 24 Contrast  Result Date: 10/06/2015 CLINICAL DATA:  Altered mental status beginning 2 days ago with possible seizure. EXAM: MRI HEAD WITHOUT CONTRAST TECHNIQUE: Multiplanar, multiecho pulse sequences of the brain and surrounding structures were obtained without intravenous contrast. COMPARISON:  Head CT 10/05/2015 FINDINGS: Brain: Diffusion imaging does not show any acute infarction. The brainstem and cerebellum are normal. There is abnormal cortical and subcortical edema in the occipital and posterior parietal regions likely secondary to posterior reversible encephalopathy. No sign of hemorrhage. The remainder the brain is normal. No mass effect. No hydrocephalus. No extra-axial collection. Vascular: Major vessels at the base of the brain show flow. Skull and upper cervical spine: Negative Sinuses/Orbits: Clear/normal Other: None significant IMPRESSION: Abnormal cortical and subcortical edema of the cerebral hemispheres posteriorly consistent with posterior reversible encephalopathy. No evidence of hemorrhage or mass effect. Electronically Signed   By: Paulina Fusi M.D.   On: 10/06/2015 10:36   US Abdomen Complete  Result Date: 10/07/2015 CLINICAL DATA:  Hyperbilirubinemia.  History of cholecystectomy. EXAM: ABDOMEN ULTRASOUND COMPLETE COMPARISON:  Abdominal ultrasound of October 05, 2015 FINDINGS:  Gallbladder: The gallbladder is surgically absent. Common bile duct: Diameter: 4 mm Liver: The hepatic echotexture is normal. There may be mild persistent intrahepatic ductal dilation centrally. IVC: No abnormality visualized. Pancreas: Visualized portion unremarkable. Spleen: Size and appearance within normal limits. Right Kidney: Length: 12.2 cm. Echogenicity within normal limits. No mass or hydronephrosis visualized. Left Kidney: Length: 12.4 cm. Echogenicity within normal limits. No mass or hydronephrosis visualized. Abdominal aorta: There is no abdominal aortic aneurysm. Other findings: Fluid is present in the left spleen no renal fossa. IMPRESSION: 1. The gallbladder is surgically absent. Mild intrahepatic ductal dilation observed. The common bile duct is normal at 4 mm. 2. No pancreatic mass nor other acute abnormality observed within the abdomen. Electronically Signed   By: David  [place] M.D.   On: 10/07/2015 13:02        Scheduled Meds: . cloNIDine  0.1 mg Oral TID  . enoxaparin (LOVENOX) injection  40 mg Subcutaneous Q24H  . feeding supplement  1 Container Oral TID BM  . folic acid  1 mg Intravenous Daily  . nicotine  14 mg Transdermal Daily  . pneumococcal 23 valent vaccine  0.5 mL Intramuscular Tomorrow-1000  . potassium chloride  40 mEq Oral BID  . sodium chloride flush  3 mL Intravenous Q12H  . thiamine  100 mg Intravenous Daily   Continuous Infusions:    LOS: 2 days    Time spent: 35 minutes    Bennett Scrape, MD [facility] Pager [phone]  If 7PM-7AM, please contact night-coverage [email or web address] Password [facility] 10/08/2015, 7:29 AM

## 2015-10-09 LAB — PROCALCITONIN

## 2015-10-09 LAB — GLUCOSE, CAPILLARY: GLUCOSE-CAPILLARY: 108 mg/dL — AB (ref 65–99)

## 2015-10-09 NOTE — Progress Notes (Signed)
PROGRESS NOTE    Jamie Evans  LKG:[id number] DOB: 10/17/1970 DOA: 10/05/2015 [facility]: [facility] (Inactive)   Brief Narrative:  45 y.o.femalewith medical history significant for anxiety, cholelithiasis, and substance abuse who presents to the emergency department from jail with altered mental status. Patient was reportedly taken into custody on 10/03/2015 and was alert and fully oriented at that time. The following day, she was found to be very lethargic and poorly responsive, and apparently incontinent of stool. She persisted in this state throughout the day yesterday and today and was brought in for evaluation of this. History is obtained through discussion with corrections officer's, ED personnel, and review of the EMR. There was suspicion at the jail but the patient had had a seizure given her incontinence, but there was no seizure-like activity witnessed. Corrections officer's have very low suspicion for ingestion or trauma during custody. It is not known whether the patient has been using alcohol in excess prior to the incarceration, but the corrections officer reports that this would be consistent with the rest of her family's behavior. Jamie Evans has history of anxiety and had been prescribed benzodiazepine in the past.  In the ED, patient is found to be be afebrile, saturating well on room air, hypertensive to the 180/110 range, and with vitals otherwise stable. EKG demonstrates a sinus rhythm with LVH by voltage criteria and nonspecific T-wave abnormalities. Head CT is negative for acute intracranial abnormality. CMP features a serum potassium of 3.1, ALT mildly elevated 64, and mild elevation in total bilirubin 1.5. Ethanol level is undetectable, ammonia levels mildly elevated 44, and CK elevated at 4:30. CBC is notable for a leukocytosis to 17,100 and a polycythemia with hemoglobin of 17.7. INR is within the normal limits, UDS is positive for opiates and cocaine,  and urinalysis features many bacteria, but no leukocyte or nitrite, and no significant WBC. A 1 L normal saline bolus was administered in the emergency department and the patient's condition remained largely unchanged. She was noted to have anepisode of large volume diarrhea while in the ED. She has remained hemodynamically stable and will be observed in the stepdown unit for ongoing evaluation and management of alteration in mental status with various lab work abnormalities as described above.  Assessment & Plan:   Principal Problem:   Acute encephalopathy Active Problems:   Hypokalemia   Leukocytosis   Polycythemia   Hyperbilirubinemia   Hypertension   Substance abuse   Elevated LFTs   Acute encephalopathy  Resolved MRI Brain showed Abnormal cortical and subcortical edema of the cerebral hemispheres posteriorly consistent with posterior reversible encephalopathy. No evidence of hemorrhage or mass effect. Ammonia level WNL   Dysuria UA negative yesterday Improved Will look into further etiologies if patient voices she has symptoms again  Leukocytosis  Improved   No signs of infection  Hypertension  Possible secondary to withdrawal Patient BP better since starting clonidine 0.1mg  po tid  Continue clonidine and monitoring  Substance abuse  UDS positive for cocaine and opiates; puncture wounds noted over UE veins  Encephalopathy seems likely secondary to withdrawal  Uncertain alcohol-history Continue CIWA and PRN ativan- has needed ativan throughout the day yesterday- will change order to increase time interval between dosings SW consulted  Patient states she is open to IP rehab facility  Polycythemia  Improved with IVF  Hypokalemia  Likely secondary to GI-losses  Improved potassium with oral Kcl Will give 40mEq KCl BID   Diarrhea  Opiate withdrawal suspected Stool studies  negative Continue to monitor electrolytes   Tobacco use Nicotine patch  HCV  positive HCV positive- will follow up with HCV Nucleic Acid Amplification Test    CPK elevation CPK decreased from 430 to 170 IVF stopped   Abnormal TSH Will need to recheck tsh on 9/9  Trop elevation ? Secondary to coccaine use Echo results- Normal LV wall thickness with LVEF 65-70%. Grade 2 diastolic dysfunction with increased LV filling pressure. Mild left atrial enlargement. Trivial mitral regurgitation. Mildly calcified aortic valve. Trivial tricuspid regurgitation.   DVT prophylaxis: Lovenox, SCD's Code Status: Full Code Family Communication: Daughters bedside Disposition Plan: patient will be discharged home when medically stable- she states she was released from jail.  Will need to have completed detox/ stopped requiring ativan.  Anticipate d/c in 24-48 hours   Consultants:   Social Work  Procedures:   none  Antimicrobials:   none    Subjective: Patient asking to go home today.  She states she just wants to leave to smoke a cigarette.  Explained that if she leaves she would considering leaving against medical advice.  Patient continues to express that she will find a way to leave to smoke a cigarette.  Per daughters, patients hallucinations becoming less frequent; she is still experiencing them though.  Patient still having shakes from detoxing.    Objective: Vitals:   10/08/15 1616 10/08/15 1657 10/08/15 2318 10/09/15 0657  BP: (!) 158/91 (!) 144/99 (!) 141/80 (!) 153/94  Pulse: 70 98 88 64  Resp: 16 18 18 20   Temp: 97.7 F (36.5 C) 98.4 F (36.9 C) 98.6 F (37 C) 97.9 F (36.6 C)  TempSrc:   Oral Oral  SpO2: 100% 100% 100% 99%  Weight:      Height:       No intake or output data in the 24 hours ending 10/09/15 0923 Filed Weights   10/05/15 1335 10/05/15 1733 10/07/15 0500  Weight: 72.6 kg (160 lb) 52.8 kg (116 lb 6.5 oz) 56.1 kg (123 lb 10.9 oz)    Examination:  General exam: Appears calm and comfortable  Respiratory system: Clear to  auscultation. Respiratory effort normal. Cardiovascular system: S1 & S2 heard, RRR. No JVD, murmurs, rubs, gallops or clicks. No pedal edema. Gastrointestinal system: Abdomen is nondistended, soft and nontender. No organomegaly or masses felt. Normal bowel sounds heard. Central nervous system: Alert and oriented x 4. No focal neurological deficits. CN II-XII GI. Tremors still noted when patient trying to eat breakfast Extremities: Symmetric 5 x 5 power. Skin: No rashes, few lesions noted on forearms bilaterally Psychiatry: Judgement and insight appear normal. Mood & affect appropriate.     Data Reviewed: I have personally reviewed following labs and imaging studies  CBC:  Recent Labs Lab 10/05/15 0500 10/05/15 1343 10/06/15 0442 10/07/15 0409  WBC  --  17.1* 11.9* 6.2  HGB  --  17.7* 14.9 13.2  HCT 42.9 48.7* 42.7 37.8  MCV  --  86.7 88.0 88.3  PLT  --  268 210 148*   Basic Metabolic Panel:  Recent Labs Lab 10/05/15 1343 10/05/15 1424 10/06/15 0442 10/07/15 0409 10/08/15 0400  NA 142  --  139 139 140  K 3.1*  --  3.2* 3.4* 3.0*  CL 103  --  108 111 112*  CO2 26  --  24 24 24   GLUCOSE 129*  --  103* 93 97  BUN 19  --  18 12 11   CREATININE 0.90  --  0.82 0.68  0.74  CALCIUM 9.6  --  8.6* 8.1* 8.1*  MG  --  2.4  --   --   --    GFR: Estimated Creatinine Clearance: 76.7 mL/min (by C-G formula based on SCr of 0.8 mg/dL). Liver Function Tests:  Recent Labs Lab 10/05/15 1343 10/06/15 0442 10/07/15 0409 10/08/15 0400  AST 25 20 17 16   ALT 64* 42 32 28  ALKPHOS 116 87 67 64  BILITOT 1.5* 1.3* 1.5* 0.6  PROT 8.3* 6.7 5.5* 5.5*  ALBUMIN 4.5 3.6 2.9* 2.9*   No results for input(s): LIPASE, AMYLASE in the last 168 hours.  Recent Labs Lab 10/05/15 1424 10/07/15 1039  AMMONIA 44* 32   Coagulation Profile:  Recent Labs Lab 10/05/15 1343  INR 1.01   Cardiac Enzymes:  Recent Labs Lab 10/05/15 1424 10/06/15 0442  CKTOTAL 430* 170  TROPONINI 0.05* 0.03*     BNP (last 3 results) No results for input(s): PROBNP in the last 8760 hours. HbA1C: No results for input(s): HGBA1C in the last 72 hours. CBG:  Recent Labs Lab 10/05/15 1339 10/06/15 0728 10/07/15 0741 10/08/15 0744 10/09/15 0745  GLUCAP 118* 95 119* 113* 108*   Lipid Profile: No results for input(s): CHOL, HDL, LDLCALC, TRIG, CHOLHDL, LDLDIRECT in the last 72 hours. Thyroid Function Tests: No results for input(s): TSH, T4TOTAL, FREET4, T3FREE, THYROIDAB in the last 72 hours. Anemia Panel: No results for input(s): VITAMINB12, FOLATE, FERRITIN, TIBC, IRON, RETICCTPCT in the last 72 hours. Sepsis Labs:  Recent Labs Lab 10/05/15 1424 10/05/15 1712 10/05/15 2300 10/07/15 0409 10/09/15 0518  PROCALCITON <0.10  --   --  <0.10 <0.10  LATICACIDVEN  --  2.2* 1.1  --   --     Recent Results (from the past 240 hour(s))  MRSA PCR Screening     Status: None   Collection Time: 10/05/15  5:35 PM  Result Value Ref Range Status   MRSA by PCR NEGATIVE NEGATIVE Final    Comment:        The GeneXpert MRSA Assay (FDA approved for NASAL specimens only), is one component of a comprehensive MRSA colonization surveillance program. It is not intended to diagnose MRSA infection nor to guide or monitor treatment for MRSA infections.   Gastrointestinal Panel by PCR , Stool     Status: None   Collection Time: 10/05/15 10:15 PM  Result Value Ref Range Status   Campylobacter species NOT DETECTED NOT DETECTED Final   Plesimonas shigelloides NOT DETECTED NOT DETECTED Final   Salmonella species NOT DETECTED NOT DETECTED Final   Yersinia enterocolitica NOT DETECTED NOT DETECTED Final   Vibrio species NOT DETECTED NOT DETECTED Final   Vibrio cholerae NOT DETECTED NOT DETECTED Final   Enteroaggregative E coli (EAEC) NOT DETECTED NOT DETECTED Final   Enteropathogenic E coli (EPEC) NOT DETECTED NOT DETECTED Final   Enterotoxigenic E coli (ETEC) NOT DETECTED NOT DETECTED Final   Shiga like  toxin producing E coli (STEC) NOT DETECTED NOT DETECTED Final   E. coli O157 NOT DETECTED NOT DETECTED Final   Shigella/Enteroinvasive E coli (EIEC) NOT DETECTED NOT DETECTED Final   Cryptosporidium NOT DETECTED NOT DETECTED Final   Cyclospora cayetanensis NOT DETECTED NOT DETECTED Final   Entamoeba histolytica NOT DETECTED NOT DETECTED Final   Giardia lamblia NOT DETECTED NOT DETECTED Final   Adenovirus F40/41 NOT DETECTED NOT DETECTED Final   Astrovirus NOT DETECTED NOT DETECTED Final   Norovirus GI/GII NOT DETECTED NOT DETECTED Final   Rotavirus A  NOT DETECTED NOT DETECTED Final   Sapovirus (I, II, IV, and V) NOT DETECTED NOT DETECTED Final         Radiology Studies: US Abdomen Complete  Result Date: 10/07/2015 CLINICAL DATA:  Hyperbilirubinemia.  History of cholecystectomy. EXAM: ABDOMEN ULTRASOUND COMPLETE COMPARISON:  Abdominal ultrasound of October 05, 2015 FINDINGS: Gallbladder: The gallbladder is surgically absent. Common bile duct: Diameter: 4 mm Liver: The hepatic echotexture is normal. There may be mild persistent intrahepatic ductal dilation centrally. IVC: No abnormality visualized. Pancreas: Visualized portion unremarkable. Spleen: Size and appearance within normal limits. Right Kidney: Length: 12.2 cm. Echogenicity within normal limits. No mass or hydronephrosis visualized. Left Kidney: Length: 12.4 cm. Echogenicity within normal limits. No mass or hydronephrosis visualized. Abdominal aorta: There is no abdominal aortic aneurysm. Other findings: Fluid is present in the left spleen no renal fossa. IMPRESSION: 1. The gallbladder is surgically absent. Mild intrahepatic ductal dilation observed. The common bile duct is normal at 4 mm. 2. No pancreatic mass nor other acute abnormality observed within the abdomen. Electronically Signed   By: David  [place] M.D.   On: 10/07/2015 13:02        Scheduled Meds: . cloNIDine  0.1 mg Oral TID  . enoxaparin (LOVENOX) injection  40 mg  Subcutaneous Q24H  . feeding supplement  1 Container Oral TID BM  . folic acid  1 mg Intravenous Daily  . nicotine  14 mg Transdermal Daily  . pneumococcal 23 valent vaccine  0.5 mL Intramuscular Tomorrow-1000  . potassium chloride  40 mEq Oral BID  . sodium chloride flush  3 mL Intravenous Q12H  . thiamine  100 mg Intravenous Daily   Continuous Infusions:    LOS: 3 days    Time spent: 30 minutes    Bennett Scrape, MD [facility] Pager [phone]  If 7PM-7AM, please contact night-coverage [email or web address] Password [email or web address] 10/09/2015, 9:23 AM

## 2015-10-10 LAB — BASIC METABOLIC PANEL
ANION GAP: 10 (ref 5–15)
BUN: 16 mg/dL (ref 6–20)
CHLORIDE: 106 mmol/L (ref 101–111)
CO2: 21 mmol/L — ABNORMAL LOW (ref 22–32)
Calcium: 8.8 mg/dL — ABNORMAL LOW (ref 8.9–10.3)
Creatinine, Ser: 0.84 mg/dL (ref 0.44–1.00)
GFR calc Af Amer: >60 mL/min (ref >60)
GFR calc non Af Amer: >60 mL/min (ref >60)
Glucose, Bld: 134 mg/dL — ABNORMAL HIGH (ref 65–99)
POTASSIUM: 3.8 mmol/L (ref 3.5–5.1)
SODIUM: 137 mmol/L (ref 135–145)

## 2015-10-10 LAB — HCV RNA QUANT RFLX ULTRA OR GENOTYP
HCV RNA QNT(LOG COPY/ML): 5.799 {Log_IU}/mL
HepC Qn: 630000 IU/mL

## 2015-10-10 LAB — GLUCOSE, CAPILLARY: Glucose-Capillary: 96 mg/dL (ref 65–99)

## 2015-10-10 LAB — HEPATITIS C GENOTYPE: Hepatitis C Genotype: 3

## 2015-10-10 MED ORDER — ZOLPIDEM TARTRATE 5 MG PO TABS
5.0000 mg | ORAL_TABLET | Freq: Once | ORAL | Status: AC
  Administered 2015-10-10: 5 mg via ORAL
  Filled 2015-10-10: qty 1

## 2015-10-10 NOTE — Progress Notes (Signed)
PROGRESS NOTE    Jamie Evans  ZOX:[id number] DOB: 03-Sep-1970 DOA: 10/05/2015 PCP: [facility] (Inactive)   Brief Narrative:  45 y.o.femalewith medical history significant for anxiety, cholelithiasis, and substance abuse who presents to the emergency department from jail with altered mental status. Patient was reportedly taken into custody on 10/03/2015 and was alert and fully oriented at that time. The following day, she was found to be very lethargic and poorly responsive, and apparently incontinent of stool. She persisted in this state throughout the day yesterday and today and was brought in for evaluation of this. History is obtained through discussion with corrections officer's, ED personnel, and review of the EMR. There was suspicion at the jail but the patient had had a seizure given her incontinence, but there was no seizure-like activity witnessed. Corrections officer's have very low suspicion for ingestion or trauma during custody. It is not known whether the patient has been using alcohol in excess prior to the incarceration, but the corrections officer reports that this would be consistent with the rest of her family's behavior. Ms. Mccrumb has history of anxiety and had been prescribed benzodiazepine in the past.  In the ED, patient is found to be be afebrile, saturating well on room air, hypertensive to the 180/110 range, and with vitals otherwise stable. EKG demonstrates a sinus rhythm with LVH by voltage criteria and nonspecific T-wave abnormalities. Head CT is negative for acute intracranial abnormality. CMP features a serum potassium of 3.1, ALT mildly elevated 64, and mild elevation in total bilirubin 1.5. Ethanol level is undetectable, ammonia levels mildly elevated 44, and CK elevated at 4:30. CBC is notable for a leukocytosis to 17,100 and a polycythemia with hemoglobin of 17.7. INR is within the normal limits, UDS is positive for opiates and cocaine,  and urinalysis features many bacteria, but no leukocyte or nitrite, and no significant WBC. A 1 L normal saline bolus was administered in the emergency department and the patient's condition remained largely unchanged. She was noted to have anepisode of large volume diarrhea while in the ED. She has remained hemodynamically stable and was observed in step down until her mentation cleared.  She was then transferred to the med surg floor to finish detoxing and social work was consulted for assistance in providing resources for rehab.  Assessment & Plan:   Principal Problem:   Acute encephalopathy Active Problems:   Hypokalemia   Leukocytosis   Polycythemia   Hyperbilirubinemia   Hypertension   Substance abuse   Elevated LFTs  Substance abuse  UDS positive for cocaine and opiates; puncture wounds noted over UE veins  Encephalopathy seems likely secondary to withdrawal  Uncertain alcohol-history Continue CIWA and PRN ativan- significant decrease in frequency of Ativan yesterday- patient states she is only asking for it as needed SW consulted  Patient states she is open to IP or OP rehab facility    Acute encephalopathy  Resolved MRI Brain showed Abnormal cortical and subcortical edema of the cerebral hemispheres posteriorly consistent with posterior reversible encephalopathy. No evidence of hemorrhage or mass effect. Ammonia level WNL   Dysuria UA negative yesterday Improved Will look into further etiologies if patient voices she has symptoms again  Leukocytosis  Improved   No signs of infection  Hypertension  Possible secondary to withdrawal Patient BP better since starting clonidine 0.1mg  po tid  Continue clonidine and monitoring  Polycythemia  Improved with IVF  Hypokalemia  Likely secondary to GI-losses as patient had diarrhea at admission  Improved potassium with oral Kcl Will give KCl BID  Potassium WNL today  Diarrhea  Opiate withdrawal  suspected Stool studies negative Continue to monitor electrolytes   Tobacco use Nicotine patch  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 Secondary to coccaine use Echo results- Normal LV wall thickness with LVEF 65-70%. Grade 2 diastolic dysfunction with increased LV filling pressure. Mild left atrial enlargement. Trivial mitral regurgitation. Mildly calcified aortic valve. Trivial tricuspid regurgitation.   DVT prophylaxis: Lovenox, SCD's Code Status: Full Code Family Communication: Daughters bedside Disposition Plan: anticipate discharge in the next 24-48 hours when detox is done  Consultants:   Social Work  Procedures:   none  Antimicrobials:   none    Subjective: Patient asked numerous questions today about what had happened when she was admitted.  She voices she understands that she was found to have cocaine and opiates in her system.  Seems open to discussing struggle with drug abuse.  She is asking about ambulating off the floor to the cafeteria.  She was found to be smoking in her room yesterday and security was called.  Denies any chest pain, chest pressure, shortness of breath, increased work of breathing, abdominal pain, diarrhea, dysuria.  Voices she still feels quite agitated and anxious at time but is trying to limit her use of the Ativan.    Objective: Vitals:   10/09/15 0657 10/09/15 1530 10/09/15 2252 10/10/15 0638  BP: (!) 153/94 (!) 149/97  (!) 142/92  Pulse: 64 67  62  Resp: 20 20  20   Temp: 97.9 F (36.6 C) 97.8 F (36.6 C)  97.8 F (36.6 C)  TempSrc: Oral   Oral  SpO2: 99% 99% 98% 100%  Weight:    56 kg (123 lb 7.3 oz)  Height:        Intake/Output Summary (Last 24 hours) at 10/10/15 1008 Last data filed at 10/09/15 1433  Gross per 24 hour   Intake              240 ml  Output                0 ml  Net              240 ml   Filed Weights   10/05/15 1733 10/07/15 0500 10/10/15 0638  Weight: 52.8 kg (116 lb 6.5 oz) 56.1 kg (123 lb 10.9 oz) 56 kg (123 lb 7.3 oz)    Examination:  General exam: Appears calm and comfortable  Respiratory system: Clear to auscultation. Respiratory effort normal. Cardiovascular system: S1 & S2 heard, RRR. No JVD, murmurs, rubs, gallops or clicks. No pedal edema. Gastrointestinal system: Abdomen is nondistended, soft and nontender. No organomegaly or masses felt. Normal bowel sounds heard. Central nervous system: Alert and oriented x 4. No focal neurological deficits. CN II-XII GI. No tremors noted today, normal finger to nose  Extremities: Symmetric 5 x 5 power. Skin: No rashes, few lesions noted on forearms bilaterally that are healing Psychiatry: Judgement and insight appear normal. Mood & affect appropriate.     Data Reviewed: I have personally reviewed following labs and imaging studies  CBC:  Recent Labs Lab 10/05/15 0500 10/05/15 1343 10/06/15 0442 10/07/15 0409  WBC  --  17.1* 11.9* 6.2  HGB  --  17.7* 14.9 13.2  HCT 42.9 48.7* 42.7 37.8  MCV  --  86.7 88.0 88.3  PLT  --  268 210 148*   Basic Metabolic Panel:  Recent Labs Lab 10/05/15 1343 10/05/15 1424 10/06/15 0442 10/07/15 0409 10/08/15 0400  NA 142  --  139 139 140  K 3.1*  --  3.2* 3.4* 3.0*  CL 103  --  108 111 112*  CO2 26  --  24 24 24   GLUCOSE 129*  --  103* 93 97  BUN 19  --  18 12 11   CREATININE 0.90  --  0.82 0.68 0.74  CALCIUM 9.6  --  8.6* 8.1* 8.1*  MG  --  2.4  --   --   --    GFR: Estimated Creatinine Clearance: 76.7 mL/min (by C-G formula based on SCr of 0.8 mg/dL). Liver Function Tests:  Recent Labs Lab 10/05/15 1343 10/06/15 0442 10/07/15 0409 10/08/15 0400  AST 25 20 17 16   ALT 64* 42 32 28  ALKPHOS 116 87 67 64  BILITOT 1.5* 1.3* 1.5* 0.6  PROT 8.3* 6.7 5.5* 5.5*  ALBUMIN 4.5 3.6  2.9* 2.9*   No results for input(s): LIPASE, AMYLASE in the last 168 hours.  Recent Labs Lab 10/05/15 1424 10/07/15 1039  AMMONIA 44* 32   Coagulation Profile:  Recent Labs Lab 10/05/15 1343  INR 1.01   Cardiac Enzymes:  Recent Labs Lab 10/05/15 1424 10/06/15 0442  CKTOTAL 430* 170  TROPONINI 0.05* 0.03*   BNP (last 3 results) No results for input(s): PROBNP in the last 8760 hours. HbA1C: No results for input(s): HGBA1C in the last 72 hours. CBG:  Recent Labs Lab 10/06/15 0728 10/07/15 0741 10/08/15 0744 10/09/15 0745 10/10/15 0735  GLUCAP 95 119* 113* 108* 96   Lipid Profile: No results for input(s): CHOL, HDL, LDLCALC, TRIG, CHOLHDL, LDLDIRECT in the last 72 hours. Thyroid Function Tests: No results for input(s): TSH, T4TOTAL, FREET4, T3FREE, THYROIDAB in the last 72 hours. Anemia Panel: No results for input(s): VITAMINB12, FOLATE, FERRITIN, TIBC, IRON, RETICCTPCT in the last 72 hours. Sepsis Labs:  Recent Labs Lab 10/05/15 1424 10/05/15 1712 10/05/15 2300 10/07/15 0409 10/09/15 0518  PROCALCITON <0.10  --   --  <0.10 <0.10  LATICACIDVEN  --  2.2* 1.1  --   --     Recent Results (from the past 240 hour(s))  MRSA PCR Screening     Status: None   Collection Time: 10/05/15  5:35 PM  Result Value Ref Range Status   MRSA by PCR NEGATIVE NEGATIVE Final    Comment:        The GeneXpert MRSA Assay (FDA approved for NASAL specimens only), is one component of a comprehensive MRSA colonization surveillance program. It is not intended to diagnose MRSA infection nor to guide or monitor treatment for MRSA infections.   Gastrointestinal Panel by PCR , Stool     Status: None   Collection Time: 10/05/15 10:15 PM  Result Value Ref Range Status   Campylobacter species NOT DETECTED NOT DETECTED Final   Plesimonas shigelloides NOT DETECTED NOT DETECTED Final   Salmonella species NOT DETECTED NOT DETECTED Final   Yersinia enterocolitica NOT DETECTED NOT  DETECTED Final   Vibrio species NOT DETECTED NOT DETECTED Final   Vibrio cholerae NOT DETECTED NOT DETECTED Final   Enteroaggregative E coli (EAEC) NOT DETECTED NOT DETECTED Final   Enteropathogenic E coli (EPEC) NOT DETECTED NOT DETECTED Final  Enterotoxigenic E coli (ETEC) NOT DETECTED NOT DETECTED Final   Shiga like toxin producing E coli (STEC) NOT DETECTED NOT DETECTED Final   E. coli O157 NOT DETECTED NOT DETECTED Final   Shigella/Enteroinvasive E coli (EIEC) NOT DETECTED NOT DETECTED Final   Cryptosporidium NOT DETECTED NOT DETECTED Final   Cyclospora cayetanensis NOT DETECTED NOT DETECTED Final   Entamoeba histolytica NOT DETECTED NOT DETECTED Final   Giardia lamblia NOT DETECTED NOT DETECTED Final   Adenovirus F40/41 NOT DETECTED NOT DETECTED Final   Astrovirus NOT DETECTED NOT DETECTED Final   Norovirus GI/GII NOT DETECTED NOT DETECTED Final   Rotavirus A NOT DETECTED NOT DETECTED Final   Sapovirus (I, II, IV, and V) NOT DETECTED NOT DETECTED Final         Radiology Studies: No results found.      Scheduled Meds: . cloNIDine  0.1 mg Oral TID  . enoxaparin (LOVENOX) injection  40 mg Subcutaneous Q24H  . feeding supplement  1 Container Oral TID BM  . folic acid  1 mg Intravenous Daily  . nicotine  14 mg Transdermal Daily  . pneumococcal 23 valent vaccine  0.5 mL Intramuscular Tomorrow-1000  . potassium chloride  40 mEq Oral BID  . sodium chloride flush  3 mL Intravenous Q12H  . thiamine  100 mg Intravenous Daily   Continuous Infusions:    LOS: 4 days    Time spent: 25 minutes    Bennett ScrapeAlex Ukleja, MD [facility] Pager [phone]  If 7PM-7AM, please contact night-coverage [email or web address] Password [email or web address] 10/10/2015, 10:08 AM

## 2015-10-11 LAB — GLUCOSE, CAPILLARY: Glucose-Capillary: 101 mg/dL — ABNORMAL HIGH (ref 65–99)

## 2015-10-11 MED ORDER — LORAZEPAM 1 MG PO TABS: 1.0000 mg | ORAL_TABLET | ORAL | Status: DC | PRN

## 2015-10-11 MED ORDER — LORAZEPAM 2 MG/ML IJ SOLN: 1.0000 mg | INTRAMUSCULAR | Status: DC | PRN

## 2015-10-11 MED ORDER — LORAZEPAM 1 MG PO TABS
1.0000 mg | ORAL_TABLET | ORAL | Status: DC | PRN
  Administered 2015-10-11 – 2015-10-12 (×3): 1 mg via ORAL
  Filled 2015-10-11 (×3): qty 1

## 2015-10-11 MED ORDER — VITAMIN B-1 100 MG PO TABS
100.0000 mg | ORAL_TABLET | Freq: Every day | ORAL | Status: DC
  Administered 2015-10-11 – 2015-10-13 (×3): 100 mg via ORAL
  Filled 2015-10-11 (×3): qty 1

## 2015-10-11 MED ORDER — LORAZEPAM 2 MG/ML IJ SOLN
1.0000 mg | INTRAMUSCULAR | Status: DC | PRN
  Administered 2015-10-11 (×2): 1 mg via INTRAVENOUS
  Filled 2015-10-11 (×3): qty 1

## 2015-10-11 MED ORDER — HYDROXYZINE HCL 25 MG PO TABS
50.0000 mg | ORAL_TABLET | Freq: Three times a day (TID) | ORAL | Status: DC | PRN
  Administered 2015-10-11 – 2015-10-12 (×2): 50 mg via ORAL
  Filled 2015-10-11 (×3): qty 2

## 2015-10-11 MED ORDER — FOLIC ACID 1 MG PO TABS
1.0000 mg | ORAL_TABLET | Freq: Every day | ORAL | Status: DC
  Administered 2015-10-11 – 2015-10-13 (×3): 1 mg via ORAL
  Filled 2015-10-11 (×3): qty 1

## 2015-10-11 NOTE — Clinical Social Work Note (Signed)
CSW provided outpatient and inpatient SA treatment resources for patient again as patient reported that she did not recall speaking with CSW previously. Patient was accepting and advised that she felt comfortable reviewing and contacting providers independently. CSW signing off.    Annelie Boak, Juleen ChinaHeather D, LCSW

## 2015-10-11 NOTE — Progress Notes (Signed)
PROGRESS NOTE    Jamie Evans  ZOX:[id number] DOB: 07/11/1970 DOA: 10/05/2015 [facility]CP: [facility] (Inactive)   Brief Narrative:  45 y.o.femalewith medical history significant for anxiety, cholelithiasis, and substance abuse who presents to the emergency department from jail with altered mental status. Patient was reportedly taken into custody on 10/03/2015 and was alert and fully oriented at that time. The following day, she was found to be very lethargic and poorly responsive, and apparently incontinent of stool. She persisted in this state throughout the day yesterday and today and was brought in for evaluation of this. History is obtained through discussion with corrections officer's, ED personnel, and review of the EMR. There was suspicion at the jail but the patient had had a seizure given her incontinence, but there was no seizure-like activity witnessed. Corrections officer's have very low suspicion for ingestion or trauma during custody. It is not known whether the patient has been using alcohol in excess prior to the incarceration, but the corrections officer reports that this would be consistent with the rest of her family's behavior. Jamie Evans has history of anxiety and had been prescribed benzodiazepine in the past.  In the ED, patient is found to be be afebrile, saturating well on room air, hypertensive to the 180/110 range, and with vitals otherwise stable. EKG demonstrates a sinus rhythm with LVH by voltage criteria and nonspecific T-wave abnormalities. Head CT is negative for acute intracranial abnormality. CMP features a serum potassium of 3.1, ALT mildly elevated 64, and mild elevation in total bilirubin 1.5. Ethanol level is undetectable, ammonia levels mildly elevated 44, and CK elevated at 4:30. CBC is notable for a leukocytosis to 17,100 and a polycythemia with hemoglobin of 17.7. INR is within the normal limits, UDS is positive for opiates and cocaine,  and urinalysis features many bacteria, but no leukocyte or nitrite, and no significant WBC. A 1 L normal saline bolus was administered in the emergency department and the patient's condition remained largely unchanged. She was noted to have anepisode of large volume diarrhea while in the ED. She has remained hemodynamically stable and was observed in step down until her mentation cleared.  She was then transferred to the med surg floor to finish detoxing and social work was consulted for assistance in providing resources for rehab.  Assessment & Plan:   Principal Problem:   Acute encephalopathy Active Problems:   Hypokalemia   Leukocytosis   Polycythemia   Hyperbilirubinemia   Hypertension   Substance abuse   Elevated LFTs  Substance abuse  UDS positive for cocaine and opiates; puncture wounds noted over UE veins  Encephalopathy seems likely secondary to withdrawal  Uncertain alcohol-history Continue CIWA and PRN ativan- put order to decrease dosage of Ativan today to 1mg - will transition to 0.5mg  this evening SW consulted  Patient states she is open to IP or OP rehab facility    Acute encephalopathy  Resolved MRI Brain showed Abnormal cortical and subcortical edema of the cerebral hemispheres posteriorly consistent with posterior reversible encephalopathy. No evidence of hemorrhage or mass effect. Ammonia level WNL   Dysuria UA negative yesterday Improved Will look into further etiologies if patient voices she has symptoms again  Leukocytosis  Improved   No signs of infection  Hypertension  Possible secondary to withdrawal Patient BP better since starting clonidine 0.1mg  po tid  Continue clonidine and monitoring  Polycythemia  Improved with IVF  Hypokalemia  Likely secondary to GI-losses as patient had diarrhea at admission  Improved  potassium with oral Kcl Will give KCl BID  Potassium WNL today  Diarrhea  Opiate withdrawal suspected Stool studies  negative Continue to monitor electrolytes   Tobacco use Nicotine patch  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 Secondary to coccaine use Echo results- Normal LV wall thickness with LVEF 65-70%. Grade 2 diastolic dysfunction with increased LV filling pressure. Mild left atrial enlargement. Trivial mitral regurgitation. Mildly calcified aortic valve. Trivial tricuspid regurgitation.   DVT prophylaxis: Lovenox, SCD's Code Status: Full Code Family Communication: Daughters bedside Disposition Plan: anticipate discharge in the next 24-48 hours when detox is done  Consultants:   Social Work  Procedures:   none  Antimicrobials:   none    Subjective:   Patient voices she is having some difficulty sleeping.  She mentions last night she got a medication (Ambien) and that it did not help her sleep.  She also voiced that she felt that she was experiencing a significant increase in her anxiety related to her discharge and that she wants a cigarette.  Patient voices that she is eating well.  Denies chest pain, chest pressure, shortness of breath, increased work of breathing, abdominal pain, nausea, vomiting, diarrhea.  Objective: Vitals:   10/10/15 0638 10/10/15 1500 10/10/15 1900 10/11/15 0400  BP: (!) 142/92 (!) 135/100 108/64 131/79  Pulse: 62 86 97 71  Resp: 20 18 16 18   Temp: 97.8 F (36.6 C) 98.1 F (36.7 C) 98.3 F (36.8 C) 97.9 F (36.6 C)  TempSrc: Oral  Oral Oral  SpO2: 100% 100%  100%  Weight: 56 kg (123 lb 7.3 oz)   55.2 kg (121 lb 11.2 oz)  Height:        Intake/Output Summary (Last 24 hours) at 10/11/15 1312 Last data filed at 10/11/15 0932  Gross per 24 hour  Intake              243 ml  Output                0 ml  Net              243 ml   Filed Weights    10/07/15 0500 10/10/15 0638 10/11/15 0400  Weight: 56.1 kg (123 lb 10.9 oz) 56 kg (123 lb 7.3 oz) 55.2 kg (121 lb 11.2 oz)    Examination:  General exam: Appears calm and comfortable  Respiratory system: Clear to auscultation. Respiratory effort normal. Cardiovascular system: S1 & S2 heard, RRR. No JVD, murmurs, rubs, gallops or clicks. No pedal edema. Gastrointestinal system: Abdomen is nondistended, soft and nontender. No organomegaly or masses felt. Normal bowel sounds heard. Central nervous system: Alert and oriented x 4. No focal neurological deficits. CN II-XII GI. No tremors noted today, normal finger to nose  Extremities: Symmetric 5 x 5 power. Skin: No rashes, few lesions noted on forearms bilaterally that are healing Psychiatry: Judgement and insight appear normal. Anxious    Data Reviewed: I have personally reviewed following labs and imaging studies  CBC:  Recent Labs Lab 10/05/15 0500 10/05/15 1343 10/06/15 0442 10/07/15 0409  WBC  --  17.1* 11.9* 6.2  HGB  --  17.7* 14.9 13.2  HCT 42.9 48.7* 42.7 37.8  MCV  --  86.7 88.0 88.3  PLT  --  268 210 148*   Basic Metabolic Panel:  Recent Labs Lab 10/05/15 1343 10/05/15 1424 10/06/15 0442 10/07/15 0409 10/08/15 0400 10/10/15 1117  NA 142  --  139 139 140 137  K 3.1*  --  3.2* 3.4* 3.0* 3.8  CL 103  --  108 111 112* 106  CO2 26  --  24 24 24  21*  GLUCOSE 129*  --  103* 93 97 134*  BUN 19  --  18 12 11 16   CREATININE 0.90  --  0.82 0.68 0.74 0.84  CALCIUM 9.6  --  8.6* 8.1* 8.1* 8.8*  MG  --  2.4  --   --   --   --    GFR: Estimated Creatinine Clearance: 73 mL/min (by C-G formula based on SCr of 0.84 mg/dL). Liver Function Tests:  Recent Labs Lab 10/05/15 1343 10/06/15 0442 10/07/15 0409 10/08/15 0400  AST 25 20 17 16   ALT 64* 42 32 28  ALKPHOS 116 87 67 64  BILITOT 1.5* 1.3* 1.5* 0.6  PROT 8.3* 6.7 5.5* 5.5*  ALBUMIN 4.5 3.6 2.9* 2.9*   No results for input(s): LIPASE, AMYLASE in the last 168  hours.  Recent Labs Lab 10/05/15 1424 10/07/15 1039  AMMONIA 44* 32   Coagulation Profile:  Recent Labs Lab 10/05/15 1343  INR 1.01   Cardiac Enzymes:  Recent Labs Lab 10/05/15 1424 10/06/15 0442  CKTOTAL 430* 170  TROPONINI 0.05* 0.03*   BNP (last 3 results) No results for input(s): PROBNP in the last 8760 hours. HbA1C: No results for input(s): HGBA1C in the last 72 hours. CBG:  Recent Labs Lab 10/07/15 0741 10/08/15 0744 10/09/15 0745 10/10/15 0735 10/11/15 0741  GLUCAP 119* 113* 108* 96 101*   Lipid Profile: No results for input(s): CHOL, HDL, LDLCALC, TRIG, CHOLHDL, LDLDIRECT in the last 72 hours. Thyroid Function Tests: No results for input(s): TSH, T4TOTAL, FREET4, T3FREE, THYROIDAB in the last 72 hours. Anemia Panel: No results for input(s): VITAMINB12, FOLATE, FERRITIN, TIBC, IRON, RETICCTPCT in the last 72 hours. Sepsis Labs:  Recent Labs Lab 10/05/15 1424 10/05/15 1712 10/05/15 2300 10/07/15 0409 10/09/15 0518  PROCALCITON <0.10  --   --  <0.10 <0.10  LATICACIDVEN  --  2.2* 1.1  --   --     Recent Results (from the past 240 hour(s))  MRSA PCR Screening     Status: None   Collection Time: 10/05/15  5:35 PM  Result Value Ref Range Status   MRSA by PCR NEGATIVE NEGATIVE Final    Comment:        The GeneXpert MRSA Assay (FDA approved for NASAL specimens only), is one component of a comprehensive MRSA colonization surveillance program. It is not intended to diagnose MRSA infection nor to guide or monitor treatment for MRSA infections.   Gastrointestinal Panel by PCR , Stool     Status: None   Collection Time: 10/05/15 10:15 PM  Result Value Ref Range Status   Campylobacter species NOT DETECTED NOT DETECTED Final   Plesimonas shigelloides NOT DETECTED NOT DETECTED Final   Salmonella species NOT DETECTED NOT DETECTED Final   Yersinia enterocolitica NOT DETECTED NOT DETECTED Final   Vibrio species NOT DETECTED NOT DETECTED Final    Vibrio cholerae NOT DETECTED NOT DETECTED Final   Enteroaggregative E coli (EAEC) NOT DETECTED NOT DETECTED Final   Enteropathogenic E coli (EPEC) NOT DETECTED NOT DETECTED Final   Enterotoxigenic E coli (ETEC) NOT DETECTED NOT DETECTED Final   Shiga like  toxin producing E coli (STEC) NOT DETECTED NOT DETECTED Final   E. coli O157 NOT DETECTED NOT DETECTED Final   Shigella/Enteroinvasive E coli (EIEC) NOT DETECTED NOT DETECTED Final   Cryptosporidium NOT DETECTED NOT DETECTED Final   Cyclospora cayetanensis NOT DETECTED NOT DETECTED Final   Entamoeba histolytica NOT DETECTED NOT DETECTED Final   Giardia lamblia NOT DETECTED NOT DETECTED Final   Adenovirus F40/41 NOT DETECTED NOT DETECTED Final   Astrovirus NOT DETECTED NOT DETECTED Final   Norovirus GI/GII NOT DETECTED NOT DETECTED Final   Rotavirus A NOT DETECTED NOT DETECTED Final   Sapovirus (I, II, IV, and V) NOT DETECTED NOT DETECTED Final         Radiology Studies: No results found.      Scheduled Meds: . cloNIDine  0.1 mg Oral TID  . enoxaparin (LOVENOX) injection  40 mg Subcutaneous Q24H  . feeding supplement  1 Container Oral TID BM  . folic acid  1 mg Oral Daily  . nicotine  14 mg Transdermal Daily  . pneumococcal 23 valent vaccine  0.5 mL Intramuscular Tomorrow-1000  . potassium chloride  40 mEq Oral BID  . sodium chloride flush  3 mL Intravenous Q12H  . thiamine  100 mg Oral Daily   Continuous Infusions:    LOS: 5 days    Time spent: 25 minutes    Bennett Scrape, MD [facility] Pager [phone]  If 7PM-7AM, please contact night-coverage [email or web address] Password [email or web address] 10/11/2015, 1:12 PM

## 2015-10-12 LAB — CREATININE, SERUM: Creatinine, Ser: 0.77 mg/dL (ref 0.44–1.00)

## 2015-10-12 LAB — GLUCOSE, CAPILLARY: Glucose-Capillary: 125 mg/dL — ABNORMAL HIGH (ref 65–99)

## 2015-10-12 MED ORDER — HYDROXYZINE HCL 25 MG PO TABS
50.0000 mg | ORAL_TABLET | Freq: Three times a day (TID) | ORAL | Status: DC
  Administered 2015-10-12 – 2015-10-13 (×3): 50 mg via ORAL
  Filled 2015-10-12 (×3): qty 2

## 2015-10-12 MED ORDER — LORAZEPAM 1 MG PO TABS
1.0000 mg | ORAL_TABLET | ORAL | Status: DC | PRN
  Administered 2015-10-12 – 2015-10-13 (×2): 1 mg via ORAL
  Filled 2015-10-12 (×2): qty 1

## 2015-10-12 MED ORDER — LORAZEPAM 2 MG/ML IJ SOLN
1.0000 mg | INTRAMUSCULAR | Status: DC | PRN
  Administered 2015-10-12 – 2015-10-13 (×4): 1 mg via INTRAVENOUS
  Filled 2015-10-12 (×3): qty 1

## 2015-10-12 MED ORDER — CALCIUM CARBONATE ANTACID 500 MG PO CHEW
1.0000 | CHEWABLE_TABLET | Freq: Every day | ORAL | Status: DC
  Administered 2015-10-12: 200 mg via ORAL
  Filled 2015-10-12: qty 1

## 2015-10-12 MED ORDER — LORAZEPAM 2 MG/ML IJ SOLN
1.0000 mg | Freq: Once | INTRAMUSCULAR | Status: AC
  Administered 2015-10-12: 1 mg via INTRAVENOUS
  Filled 2015-10-12: qty 1

## 2015-10-12 MED ORDER — HYDROXYZINE HCL 25 MG PO TABS: 50.0000 mg | ORAL_TABLET | Freq: Three times a day (TID) | ORAL | Status: DC

## 2015-10-12 MED ORDER — LORAZEPAM 0.5 MG PO TABS
0.5000 mg | ORAL_TABLET | Freq: Four times a day (QID) | ORAL | Status: DC | PRN
  Administered 2015-10-13: 0.5 mg via ORAL
  Filled 2015-10-12 (×2): qty 1

## 2015-10-12 MED ORDER — ADULT MULTIVITAMIN W/MINERALS CH
1.0000 | ORAL_TABLET | Freq: Every day | ORAL | Status: DC
  Administered 2015-10-12 – 2015-10-13 (×2): 1 via ORAL
  Filled 2015-10-12: qty 1

## 2015-10-12 NOTE — Progress Notes (Signed)
PROGRESS NOTE    Jamie Evans  ZOX:[id number] DOB: 01-09-1971 DOA: 10/05/2015 PCP: [facility] (Inactive)   Brief Narrative:  45 y.o.femalewith medical history significant for anxiety, cholelithiasis, and substance abuse who presents to the emergency department from jail with altered mental status. Patient was reportedly taken into custody on 10/03/2015 and was alert and fully oriented at that time. The following day, she was found to be very lethargic and poorly responsive, and apparently incontinent of stool. She persisted in this state throughout the day yesterday and today and was brought in for evaluation of this. History is obtained through discussion with corrections officer's, ED personnel, and review of the EMR. There was suspicion at the jail but the patient had had a seizure given her incontinence, but there was no seizure-like activity witnessed. Corrections officer's have very low suspicion for ingestion or trauma during custody. It is not known whether the patient has been using alcohol in excess prior to the incarceration, but the corrections officer reports that this would be consistent with the rest of her family's behavior. Ms. Jamie Evans has history of anxiety and had been prescribed benzodiazepine in the past.  In the ED, patient is found to be be afebrile, saturating well on room air, hypertensive to the 180/110 range, and with vitals otherwise stable. EKG demonstrates a sinus rhythm with LVH by voltage criteria and nonspecific T-wave abnormalities. Head CT is negative for acute intracranial abnormality. CMP features a serum potassium of 3.1, ALT mildly elevated 64, and mild elevation in total bilirubin 1.5. Ethanol level is undetectable, ammonia levels mildly elevated 44, and CK elevated at 4:30. CBC is notable for a leukocytosis to 17,100 and a polycythemia with hemoglobin of 17.7. INR is within the normal limits, UDS is positive for opiates and cocaine,  and urinalysis features many bacteria, but no leukocyte or nitrite, and no significant WBC. A 1 L normal saline bolus was administered in the emergency department and the patient's condition remained largely unchanged. She was noted to have anepisode of large volume diarrhea while in the ED. She has remained hemodynamically stable and was observed in step down until her mentation cleared.  She was then transferred to the med surg floor to finish detoxing and social work was consulted for assistance in providing resources for rehab.  Assessment & Plan:   Principal Problem:   Acute encephalopathy Active Problems:   Hypokalemia   Leukocytosis   Polycythemia   Hyperbilirubinemia   Hypertension   Substance abuse   Elevated LFTs  Substance abuse/ Detox UDS positive for cocaine and opiates; puncture wounds noted over UE veins  Encephalopathy seems likely secondary to withdrawal  Uncertain alcohol-history Continue CIWA and PRN ativan- when ativan dose was decreased patient began having hypertension, anxiety, diaphoresis, nausea and vomiting Had to reinstate Ativan q4h PRN SW consulted  Patient states she is open to IP or OP rehab facility  Will need to taper ativan but patient begins to have significant withdrawal symptoms when it is decreased   Acute encephalopathy  Resolved MRI Brain showed Abnormal cortical and subcortical edema of the cerebral hemispheres posteriorly consistent with posterior reversible encephalopathy. No evidence of hemorrhage or mass effect. Ammonia level WNL   Dysuria UA negative yesterday Improved Will look into further etiologies if patient voices she has symptoms again  Leukocytosis  Improved   No signs of infection  Hypertension  Possible secondary to withdrawal Patient BP better since starting clonidine 0.1mg  po tid  Continue clonidine and monitoring  BP elevated today likely due to withdrawal  Polycythemia  Improved with IVF  Hypokalemia    Likely secondary to GI-losses as patient had diarrhea at admission  Improved potassium with oral Kcl Will give 40mEq KCl BID  Will redraw labs in am  Diarrhea  Opiate withdrawal suspected Stool studies negative Continue to monitor electrolytes   Tobacco use Nicotine patch  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 Secondary to coccaine use Echo results- Normal LV wall thickness with LVEF 65-70%. Grade 2 diastolic dysfunction with increased LV filling pressure. Mild left atrial enlargement. Trivial mitral regurgitation. Mildly calcified aortic valve. Trivial tricuspid regurgitation.   DVT prophylaxis: Lovenox, SCD's Code Status: Full Code Family Communication: Daughters bedside Disposition Plan: am trying to slowly discontinue Ativan as tolerated.  Today patient is detoxing significantly.  Anticipate dc in 24-48 hours.  Consultants:   Social Work  Procedures:   none  Antimicrobials:   none    Subjective: Patient is agitated and diaphoretic.  She mentions she feels lousy since decreasing her Ativan.  Mentions she barely slept last night due to anxiety and nausea.  She had four episodes (per her report) of emesis overnight.  She did take the vistaril that was prescribed and when she took that with ativan reports improvement in her symptoms.  States she does not feel ready for discharge today.  Objective: Vitals:   10/11/15 1932 10/11/15 2232 10/12/15 0534 10/12/15 0700  BP:  104/72 (!) 142/106 (!) 153/109  Pulse:  (!) 109 84   Resp:  15 17   Temp:  98.4 F (36.9 C) 98.5 F (36.9 C)   TempSrc:  Oral Oral   SpO2: 98% 99% 100%   Weight:    53.6 kg (118 lb 1.6 oz)  Height:        Intake/Output Summary (Last 24 hours) at 10/12/15 1012 Last data filed at 10/11/15 1647  Gross per 24 hour   Intake              360 ml  Output                0 ml  Net              360 ml   Filed Weights   10/10/15 0638 10/11/15 0400 10/12/15 0700  Weight: 56 kg (123 lb 7.3 oz) 55.2 kg (121 lb 11.2 oz) 53.6 kg (118 lb 1.6 oz)    Examination:  General exam: Appears in mild distress  Respiratory system: Clear to auscultation. Respiratory effort normal. Cardiovascular system: S1 & S2 heard, RRR. No JVD, murmurs, rubs, gallops or clicks. No pedal edema. Gastrointestinal system: Abdomen is nondistended, soft and nontender. No organomegaly or masses felt. Normal bowel sounds heard. Central nervous system: Alert and oriented x 4. No focal neurological deficits. CN II-XII GI. Tremors noted today, normal finger to nose  Extremities: Symmetric 5 x 5 power. Skin: No rashes, few lesions noted on forearms bilaterally that are healing Psychiatry: Judgement and insight appear normal. Very Anxious    Data Reviewed: I have personally reviewed following labs and imaging studies  CBC:  Recent Labs Lab 10/05/15 1343 10/06/15 0442 10/07/15 0409  WBC 17.1* 11.9* 6.2  HGB 17.7* 14.9 13.2  HCT 48.7* 42.7 37.8  MCV 86.7 88.0 88.3  PLT 268  210 148*   Basic Metabolic Panel:  Recent Labs Lab 10/05/15 1343 10/05/15 1424 10/06/15 0442 10/07/15 0409 10/08/15 0400 10/10/15 1117 10/12/15 0610  NA 142  --  139 139 140 137  --   K 3.1*  --  3.2* 3.4* 3.0* 3.8  --   CL 103  --  108 111 112* 106  --   CO2 26  --  24 24 24  21*  --   GLUCOSE 129*  --  103* 93 97 134*  --   BUN 19  --  18 12 11 16   --   CREATININE 0.90  --  0.82 0.68 0.74 0.84 0.77  CALCIUM 9.6  --  8.6* 8.1* 8.1* 8.8*  --   MG  --  2.4  --   --   --   --   --    GFR: Estimated Creatinine Clearance: 75.1 mL/min (by C-G formula based on SCr of 0.8 mg/dL). Liver Function Tests:  Recent Labs Lab 10/05/15 1343 10/06/15 0442 10/07/15 0409 10/08/15 0400  AST 25 20 17 16   ALT 64* 42 32 28  ALKPHOS 116 87 67 64  BILITOT 1.5* 1.3*  1.5* 0.6  PROT 8.3* 6.7 5.5* 5.5*  ALBUMIN 4.5 3.6 2.9* 2.9*   No results for input(s): LIPASE, AMYLASE in the last 168 hours.  Recent Labs Lab 10/05/15 1424 10/07/15 1039  AMMONIA 44* 32   Coagulation Profile:  Recent Labs Lab 10/05/15 1343  INR 1.01   Cardiac Enzymes:  Recent Labs Lab 10/05/15 1424 10/06/15 0442  CKTOTAL 430* 170  TROPONINI 0.05* 0.03*   BNP (last 3 results) No results for input(s): PROBNP in the last 8760 hours. HbA1C: No results for input(s): HGBA1C in the last 72 hours. CBG:  Recent Labs Lab 10/08/15 0744 10/09/15 0745 10/10/15 0735 10/11/15 0741 10/12/15 0759  GLUCAP 113* 108* 96 101* 125*   Lipid Profile: No results for input(s): CHOL, HDL, LDLCALC, TRIG, CHOLHDL, LDLDIRECT in the last 72 hours. Thyroid Function Tests: No results for input(s): TSH, T4TOTAL, FREET4, T3FREE, THYROIDAB in the last 72 hours. Anemia Panel: No results for input(s): VITAMINB12, FOLATE, FERRITIN, TIBC, IRON, RETICCTPCT in the last 72 hours. Sepsis Labs:  Recent Labs Lab 10/05/15 1424 10/05/15 1712 10/05/15 2300 10/07/15 0409 10/09/15 0518  PROCALCITON <0.10  --   --  <0.10 <0.10  LATICACIDVEN  --  2.2* 1.1  --   --     Recent Results (from the past 240 hour(s))  MRSA PCR Screening     Status: None   Collection Time: 10/05/15  5:35 PM  Result Value Ref Range Status   MRSA by PCR NEGATIVE NEGATIVE Final    Comment:        The GeneXpert MRSA Assay (FDA approved for NASAL specimens only), is one component of a comprehensive MRSA colonization surveillance program. It is not intended to diagnose MRSA infection nor to guide or monitor treatment for MRSA infections.   Gastrointestinal Panel by PCR , Stool     Status: None   Collection Time: 10/05/15 10:15 PM  Result Value Ref Range Status   Campylobacter species NOT DETECTED NOT DETECTED Final   Plesimonas shigelloides NOT DETECTED NOT DETECTED Final   Salmonella species NOT DETECTED NOT  DETECTED Final   Yersinia enterocolitica NOT DETECTED NOT DETECTED Final   Vibrio species NOT DETECTED NOT DETECTED Final   Vibrio cholerae NOT DETECTED NOT DETECTED Final   Enteroaggregative E coli (EAEC) NOT DETECTED NOT DETECTED Final  Enteropathogenic E coli (EPEC) NOT DETECTED NOT DETECTED Final   Enterotoxigenic E coli (ETEC) NOT DETECTED NOT DETECTED Final   Shiga like toxin producing E coli (STEC) NOT DETECTED NOT DETECTED Final   E. coli O157 NOT DETECTED NOT DETECTED Final   Shigella/Enteroinvasive E coli (EIEC) NOT DETECTED NOT DETECTED Final   Cryptosporidium NOT DETECTED NOT DETECTED Final   Cyclospora cayetanensis NOT DETECTED NOT DETECTED Final   Entamoeba histolytica NOT DETECTED NOT DETECTED Final   Giardia lamblia NOT DETECTED NOT DETECTED Final   Adenovirus F40/41 NOT DETECTED NOT DETECTED Final   Astrovirus NOT DETECTED NOT DETECTED Final   Norovirus GI/GII NOT DETECTED NOT DETECTED Final   Rotavirus A NOT DETECTED NOT DETECTED Final   Sapovirus (I, II, IV, and V) NOT DETECTED NOT DETECTED Final         Radiology Studies: No results found.      Scheduled Meds: . calcium carbonate  1 tablet Oral Daily  . cloNIDine  0.1 mg Oral TID  . enoxaparin (LOVENOX) injection  40 mg Subcutaneous Q24H  . feeding supplement  1 Container Oral TID BM  . folic acid  1 mg Oral Daily  . hydrOXYzine  50 mg Oral TID  . LORazepam  1 mg Intravenous Once  . multivitamin with minerals  1 tablet Oral Daily  . nicotine  14 mg Transdermal Daily  . pneumococcal 23 valent vaccine  0.5 mL Intramuscular Tomorrow-1000  . potassium chloride  40 mEq Oral BID  . sodium chloride flush  3 mL Intravenous Q12H  . thiamine  100 mg Oral Daily   Continuous Infusions:    LOS: 6 days    Time spent: 25 minutes    Bennett Scrape, MD [facility] Pager [phone]  If 7PM-7AM, please contact night-coverage [email or web address] Password [email or web address] 10/12/2015, 10:12 AM

## 2015-10-12 NOTE — Progress Notes (Signed)
Pt's bp 142/106, prn hydralazine given, BP to be reassessed.

## 2015-10-12 NOTE — Progress Notes (Signed)
Pt's BP reassessed, BP 153/109. Pt is complaining of nausea and is feeling sick.

## 2015-10-13 LAB — CBC WITH DIFFERENTIAL/PLATELET
Basophils Absolute: 0.1 10*3/uL (ref 0.0–0.1)
Basophils Relative: 1 %
EOS PCT: 6 %
Eosinophils Absolute: 0.6 10*3/uL (ref 0.0–0.7)
HEMATOCRIT: 44.5 % (ref 36.0–46.0)
Hemoglobin: 15.5 g/dL — ABNORMAL HIGH (ref 12.0–15.0)
LYMPHS ABS: 3.2 10*3/uL (ref 0.7–4.0)
LYMPHS PCT: 32 %
MCH: 30.3 pg (ref 26.0–34.0)
MCHC: 34.8 g/dL (ref 30.0–36.0)
MCV: 86.9 fL (ref 78.0–100.0)
MONOS PCT: 11 %
Monocytes Absolute: 1.1 10*3/uL — ABNORMAL HIGH (ref 0.1–1.0)
NEUTROS ABS: 5 10*3/uL (ref 1.7–7.7)
Neutrophils Relative %: 50 %
PLATELETS: 297 10*3/uL (ref 150–400)
RBC: 5.12 MIL/uL — AB (ref 3.87–5.11)
RDW: 12.3 % (ref 11.5–15.5)
WBC: 10 10*3/uL (ref 4.0–10.5)

## 2015-10-13 LAB — COMPREHENSIVE METABOLIC PANEL
ALT: 52 U/L (ref 14–54)
AST: 20 U/L (ref 15–41)
Albumin: 3.7 g/dL (ref 3.5–5.0)
Alkaline Phosphatase: 64 U/L (ref 38–126)
Anion gap: 8 (ref 5–15)
BILIRUBIN TOTAL: 0.3 mg/dL (ref 0.3–1.2)
BUN: 18 mg/dL (ref 6–20)
CHLORIDE: 101 mmol/L (ref 101–111)
CO2: 24 mmol/L (ref 22–32)
CREATININE: 0.81 mg/dL (ref 0.44–1.00)
Calcium: 9 mg/dL (ref 8.9–10.3)
Glucose, Bld: 103 mg/dL — ABNORMAL HIGH (ref 65–99)
Potassium: 4.6 mmol/L (ref 3.5–5.1)
Sodium: 133 mmol/L — ABNORMAL LOW (ref 135–145)
TOTAL PROTEIN: 6.9 g/dL (ref 6.5–8.1)

## 2015-10-13 LAB — GLUCOSE, CAPILLARY: GLUCOSE-CAPILLARY: 109 mg/dL — AB (ref 65–99)

## 2015-10-13 MED ORDER — CALCIUM CARBONATE ANTACID 500 MG PO CHEW: 1.0000 | CHEWABLE_TABLET | Freq: Three times a day (TID) | ORAL | Status: DC | PRN

## 2015-10-13 MED ORDER — LORAZEPAM 0.5 MG PO TABS: 0.5000 mg | ORAL_TABLET | Freq: Two times a day (BID) | ORAL | 0 refills | Status: DC | PRN

## 2015-10-13 MED ORDER — CALCIUM CARBONATE ANTACID 500 MG PO CHEW: 1.0000 | CHEWABLE_TABLET | Freq: Three times a day (TID) | ORAL | 0 refills | Status: DC | PRN

## 2015-10-13 MED ORDER — ONDANSETRON HCL 4 MG/2ML IJ SOLN
4.0000 mg | Freq: Once | INTRAMUSCULAR | Status: AC
  Administered 2015-10-13: 4 mg via INTRAVENOUS
  Filled 2015-10-13: qty 2

## 2015-10-13 MED ORDER — CLONIDINE HCL 0.1 MG PO TABS: 0.1000 mg | ORAL_TABLET | Freq: Every day | ORAL | 0 refills | Status: DC

## 2015-10-13 MED ORDER — THIAMINE HCL 100 MG PO TABS: 100.0000 mg | ORAL_TABLET | Freq: Every day | ORAL | 0 refills | Status: DC

## 2015-10-13 MED ORDER — ONDANSETRON HCL 4 MG PO TABS: 4.0000 mg | ORAL_TABLET | Freq: Four times a day (QID) | ORAL | 0 refills | Status: DC | PRN

## 2015-10-13 MED ORDER — FOLIC ACID 1 MG PO TABS: 1.0000 mg | ORAL_TABLET | Freq: Every day | ORAL | 0 refills | Status: DC

## 2015-10-13 MED ORDER — DOCUSATE SODIUM 100 MG PO CAPS: 100.0000 mg | ORAL_CAPSULE | Freq: Two times a day (BID) | ORAL | 0 refills | Status: DC | PRN

## 2015-10-13 MED ORDER — HYDROXYZINE HCL 50 MG PO TABS: 50.0000 mg | ORAL_TABLET | Freq: Three times a day (TID) | ORAL | 0 refills | Status: DC | PRN

## 2015-10-13 MED ORDER — NICOTINE 14 MG/24HR TD PT24: 14.0000 mg | MEDICATED_PATCH | Freq: Every day | TRANSDERMAL | 0 refills | Status: DC

## 2015-10-13 MED ORDER — BOOST / RESOURCE BREEZE PO LIQD: 1.0000 | Freq: Three times a day (TID) | ORAL | 0 refills | Status: DC

## 2015-10-13 NOTE — Evaluation (Signed)
Physical Therapy Evaluation Patient Details Name: Jamie Evans XXXParrish MRN: [id number] DOB: 03/02/1970 Today's Date: 10/13/2015   History of Present Illness  45 y.o. female with medical history significant for anxiety, cholelithiasis, mental break-down in 2005, UTI, and substance abuse who presents to the emergency department from jail with altered mental status. Patient was reportedly taken into custody on 10/03/2015 and was alert and fully oriented at that time. The following day, she was found to be very lethargic and poorly responsive, and apparently incontinent of stool. She persisted in this state throughout the day yesterday and today and was brought in for evaluation of this. History is obtained through discussion with corrections officer's, ED personnel, and review of the EMR. There was suspicion at the jail but the patient had had a seizure given her incontinence, but there was no seizure-like activity witnessed. Corrections officer's have very low suspicion for ingestion or trauma during custody.  Head CT is negative for acute intracranial abnormality.  UDS is positive for opiates and cocaine, and urinalysis features many bacteria, but no leukocyte or nitrite.  UE's with multiple puncture wounds over UE veins.  Dx: acute encephalopathy due to withdrawal, and elevated troponin due to grade 2 diastolic dysfunction.     Clinical Impression  Pt received sitting up on the EOB with dtr, and dt'rs friend present.  Pt is agreeable to PT evaluation.  Pt expressed that she is independent with all functional mobility and community mobility at baseline.  PT ambulated around the unit, with head turns, negotiated 1 flight of steps - all independently.  Pt does not demonstrate any skilled PT needs at this time, will sign off.  No PT follow up needed.     Follow Up Recommendations No PT follow up    Equipment Recommendations  None recommended by PT    Recommendations for Other Services       Precautions  / Restrictions Precautions Precautions: None Precaution Comments: Pt feels that she was probably already laying down when they found her.  She does not believe that she fell.  Restrictions Weight Bearing Restrictions: No      Mobility  Bed Mobility Overal bed mobility: Modified Independent                Transfers Overall transfer level: Modified independent Equipment used: None                Ambulation/Gait Ambulation/Gait assistance: Independent Ambulation Distance (Feet): 250 Feet Assistive device: None Gait Pattern/deviations: WFL(Within Functional Limits)   Gait velocity interpretation: at or above normal speed for age/gender    Stairs Stairs: Yes Stairs assistance: Modified independent (Device/Increase time) Stair Management: One rail Right;Alternating pattern;Forwards Number of Stairs: 10    Wheelchair Mobility    Modified Rankin (Stroke Patients Only)       Balance Overall balance assessment: Independent                           High level balance activites: Turns;Head turns High Level Balance Comments: All WNL             Pertinent Vitals/Pain Pain Assessment: No/denies pain    Home Living   Living Arrangements: Children;Spouse/significant other (Dtr and husband - who is currently in the ICU after having a colonoscopy and then began to bleed.  ) Available Help at Discharge: Family (Pt has 4 children, and they all come to help.  ) Type of Home: Mobile home Home Access:  Stairs to enter Entrance Stairs-Rails: Can reach both Entrance Stairs-Number of Steps: 2  Home Layout: One level Home Equipment: None      Prior Function Level of Independence: Independent         Comments: pt was still driving, and is a Tourist information centre manager.      Hand Dominance   Dominant Hand: Right    Extremity/Trunk Assessment   Upper Extremity Assessment: Overall WFL for tasks assessed           Lower Extremity Assessment:  Overall WFL for tasks assessed         Communication   Communication: No difficulties  Cognition Arousal/Alertness: Awake/alert Behavior During Therapy: WFL for tasks assessed/performed Overall Cognitive Status: Within Functional Limits for tasks assessed                      General Comments      Exercises        Assessment/Plan    PT Assessment Patent does not need any further PT services  PT Diagnosis Difficulty walking   PT Problem List    PT Treatment Interventions     PT Goals (Current goals can be found in the Care Plan section) Acute Rehab PT Goals PT Goal Formulation: All assessment and education complete, DC therapy    Frequency     Barriers to discharge        Co-evaluation               End of Session Equipment Utilized During Treatment: Gait belt Activity Tolerance: Patient tolerated treatment well Patient left: in bed Nurse Communication: Mobility status         Time: 1610-9604 PT Time Calculation (min) (ACUTE ONLY): 10 min   Charges:   PT Evaluation $PT Eval Low Complexity: 1 Procedure     PT G Codes:        Beth Denetta Fei, PT, DPT X: [phone]

## 2015-10-13 NOTE — Progress Notes (Signed)
Pt has orders for discharge. IV and telemetry removed; IV site WNL. Discharge instructions given, pt verbalized understanding. Medication regimen reviewed, pt verbalized understanding. No further questions or concerns at this time. Family present to take pt home.  Quita SkyeMorgan P Dishmon, RN

## 2015-10-14 NOTE — Discharge Summary (Signed)
[facility] Discharge Summary   Patient: Jamie Evans ZOX:[id number]   PCP: [facility] (Inactive) DOB: 1970/11/01   Date of admission: 10/05/2015   Date of discharge: 10/13/2015   Discharge Diagnoses:  Principal Problem:   Acute encephalopathy Active Problems:   Hypokalemia   Leukocytosis   Polycythemia   Hyperbilirubinemia   Hypertension   Substance abuse   Elevated LFTs   Admitted From: Home Disposition:  Home  Recommendations for Outpatient Follow-up:  1. Follow-up with PCP in one week to adjust blood pressure medication   Follow-up Information    [facility]. Schedule an appointment as soon as possible for a visit in 1 week(s).   Specialty:  Internal Medicine Why:  for follow up on blood pressure.  Contact information: [address] [place] [address] [phone]          Diet recommendation: Regular diet  Activity: The patient is advised to gradually reintroduce usual activities.  Discharge Condition: good  Code Status: Full code  History of present illness: As per the H and P dictated on admission, " Jamie Evans is a 45 y.o. female with medical history significant for anxiety, cholelithiasis, and substance abuse who presents to the emergency department from jail with altered mental status. Patient was reportedly taken into custody on 10/03/2015 and was alert and fully oriented at that time. The following day, she was found to be very lethargic and poorly responsive, and apparently incontinent of stool. She persisted in this state throughout the day yesterday and today and was brought in for evaluation of this. History is obtained through discussion with corrections officer's, ED personnel, and review of the EMR. There was suspicion at the jail but the patient had had a seizure given her incontinence, but there was no seizure-like activity witnessed. Corrections officer's have very low  suspicion for ingestion or trauma during custody. It is not known whether the patient has been using alcohol in excess prior to the incarceration, but the corrections officer reports that this would be consistent with the rest of her family's behavior. Ms. Mullings has history of anxiety and had been prescribed benzodiazepine in the past. There's been no cough or vomiting witnessed. The patient has occasionally voiced pain complaints wherever she is touched, but no other intelligible verbalizations"  Hospital Course:  Summary of her active problems in the hospital is as following. Substance abuse/ Detox UDS positive for cocaine and opiates; puncture wounds noted over UE veins  Encephalopathy seems likely secondary to withdrawal  Uncertain alcohol-history Patient was monitored on CIWA protocol, her CIWA score remained less than 5 for last 24 hours. Patient was given PRN ativan-  Discharge on Ativan every 12 PRN for 3 doses. SW consulted, patient was given resources for rehabilitation. Patient states she is open to IP or OP rehab facility   Acute encephalopathy  PRES-posterior reversible encephalopathy syndrome. Resolved MRI Brain showed Abnormal cortical and subcortical edema of the cerebral hemispheres posteriorly consistent with posterior reversible encephalopathy.  Left ear from hypertension from cocaine. Blood pressure is significantly better. No evidence of hemorrhage or mass effect. Ammonia level WNL  Dysuria UA negative  Improved  Leukocytosis  Improved  No signs of infection  Hypertension  Possible secondary to withdrawal Patient BP better since starting clonidine 0.1mg  po tid  Continue clonidine and monitoring Recommend patient to follow-up with PCP regarding long-term blood pressure control.  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  CPK elevation CPK decreased from 430 to 170 IVF stopped as patient tolerating PO  Trop elevation ? Secondary to coccaine use Echo results- Normal LV wall thickness with LVEF 65-70%. Grade 2 diastolicdysfunction with increased LV filling pressure. Mild left atrialenlargement. Trivial mitral regurgitation. Mildly calcifiedaortic valve. Trivial tricuspid regurgitation.   All other chronic medical condition were stable during the hospitalization.  Patient was seen by physical therapy, who recommended no indication for home therapy. On the day of the discharge the patient's vitals were stable, and no other acute medical condition were reported by patient. the patient was felt safe to be discharge at home with family.  Procedures and Results:  None   Consultations:  none  DISCHARGE MEDICATION: Discharge Medication List as of 10/13/2015 10:34 AM    START taking these medications   Details  calcium carbonate (TUMS - DOSED IN MG ELEMENTAL CALCIUM) 500 MG chewable tablet Chew 1 tablet (200 mg of elemental calcium total) by mouth 3 (three) times daily as needed for indigestion or heartburn., Starting Wed 10/13/2015, Normal    cloNIDine (CATAPRES) 0.1 MG tablet Take 1 tablet (0.1 mg total) by mouth daily., Starting Wed 10/13/2015, Normal    docusate sodium (COLACE) 100 MG capsule Take 1 capsule (100 mg total) by mouth 2 (two) times daily as needed for mild constipation., Starting Wed 10/13/2015, Normal    feeding supplement (BOOST / RESOURCE BREEZE) LIQD Take 1 Container by mouth 3 (three) times daily between meals., Starting Wed 10/13/2015, Normal    folic acid (FOLVITE) 1 MG tablet Take 1 tablet (1 mg total) by mouth daily.,  Starting Wed 10/13/2015, Normal    hydrOXYzine (ATARAX/VISTARIL) 50 MG tablet Take 1 tablet (50 mg total) by mouth every 8 (eight) hours as needed for anxiety or nausea., Starting Wed 10/13/2015, Normal    LORazepam (ATIVAN) 0.5 MG tablet Take 1 tablet (0.5 mg total) by mouth every 12 (twelve) hours as needed for anxiety., Starting Wed 10/13/2015, Print    nicotine (NICODERM CQ - DOSED IN MG/24 HOURS) 14 mg/24hr patch Place 1 patch (14 mg total) onto the skin daily., Starting Wed 10/13/2015, Normal    ondansetron (ZOFRAN) 4 MG tablet Take 1 tablet (4 mg total) by mouth every 6 (six) hours as needed for nausea., Starting Wed 10/13/2015, Normal    thiamine 100 MG tablet Take 1 tablet (100 mg total) by mouth daily., Starting Wed 10/13/2015, Normal       Allergies  Allergen Reactions  . Hydrocodone Nausea And Vomiting  . Penicillins Nausea Only    Has patient had a PCN reaction causing immediate rash, facial/tongue/throat swelling, SOB or lightheadedness with hypotension: No Has patient had a PCN reaction causing severe rash involving mucus membranes or skin necrosis: No Has patient had a PCN reaction that required hospitalization No Has patient had a PCN reaction occurring within the last 10 years: No If all of the above answers are "NO", then may proceed with Cephalosporin use.    Discharge Instructions    Diet general    Complete by:  As directed    Discharge instructions    Complete by:  As directed    It  is important that you read following instructions as well as go over your medication list with RN to help you understand your care after this hospitalization.  Discharge Instructions: Please follow-up with PCP in one week  Please request your primary care physician to go over all Hospital Tests and Procedure/Radiological results at the follow up,  Please get all Hospital records sent to your PCP by signing hospital release before you go home.   Do not drive, operating heavy  machinery, perform activities at heights, swimming or participation in water activities or provide baby sitting services; until you have been seen by Primary Care Physician or a Neurologist and advised to do so again. Do not take more than prescribed Pain, Sleep and Anxiety Medications. You were cared for by a hospitalist during your hospital stay. If you have any questions about your discharge medications or the care you received while you were in the hospital after you are discharged, you can call the unit and ask to speak with the hospitalist on call if the hospitalist that took care of you is not available.  Once you are discharged, your primary care physician will handle any further medical issues. Please note that NO REFILLS for any discharge medications will be authorized once you are discharged, as it is imperative that you return to your primary care physician (or establish a relationship with a primary care physician if you do not have one) for your aftercare needs so that they can reassess your need for medications and monitor your lab values. You Must read complete instructions/literature along with all the possible adverse reactions/side effects for all the Medicines you take and that have been prescribed to you. Take any new Medicines after you have completely understood and accept all the possible adverse reactions/side effects. Wear Seat belts while driving. If you have smoked or chewed Tobacco in the last 2 yrs please stop smoking and/or stop any Recreational drug use.   Increase activity slowly    Complete by:  As directed      Discharge Exam: Filed Weights   10/11/15 0400 10/12/15 0700 10/13/15 0636  Weight: 55.2 kg (121 lb 11.2 oz) 53.6 kg (118 lb 1.6 oz) 52.8 kg (116 lb 6.4 oz)   Vitals:   10/12/15 2107 10/13/15 0300  BP: 106/66 (!) 114/91  Pulse: (!) 103 88  Resp: 20 20  Temp: 98.1 F (36.7 C) 98.3 F (36.8 C)   General: Appear in no distress, no Rash; Oral Mucosa  moist. Cardiovascular: S1 and S2 Present, no Murmur, no JVD Respiratory: Bilateral Air entry present and Clear to Auscultation, no Crackles, no wheezes Abdomen: Bowel Sound present, Soft and no tenderness Extremities: no Pedal edema, no calf tenderness Neurology: Grossly no focal neuro deficit.  The results of significant diagnostics from this hospitalization (including imaging, microbiology, ancillary and laboratory) are listed below for reference.    Significant Diagnostic Studies: Dg Chest 1 View  Result Date: 10/05/2015 CLINICAL DATA:  Leukocytosis and altered mental state EXAM: CHEST 1 VIEW COMPARISON:  None. FINDINGS: The heart size and mediastinal contours are within normal limits. Both lungs are clear. The visualized skeletal structures are unremarkable. IMPRESSION: No active disease. Electronically Signed   By: Marlan Palau M.D.   On: 10/05/2015 18:02   Ct Head Wo Contrast  Result Date: 10/05/2015 CLINICAL DATA:  Altered mental status, possible seizure. EXAM: CT HEAD WITHOUT CONTRAST TECHNIQUE: Contiguous axial images were obtained from the base of the skull through the vertex without intravenous contrast.  COMPARISON:  None. FINDINGS: Brain: No acute intracranial abnormality. Specifically, no hemorrhage, hydrocephalus, mass lesion, acute infarction, or significant intracranial injury. Vascular: No hyperdense vessel or unexpected calcification. Skull: No acute calvarial abnormality. Sinuses/Orbits: Visualized paranasal sinuses and mastoids clear. Orbital soft tissues unremarkable. Other: None IMPRESSION: No acute intracranial abnormality. Electronically Signed   By: Charlett NoseKevin  Dover M.D.   On: 10/05/2015 15:24   Mr Brain Wo Contrast  Result Date: 10/06/2015 CLINICAL DATA:  Altered mental status beginning 2 days ago with possible seizure. EXAM: MRI HEAD WITHOUT CONTRAST TECHNIQUE: Multiplanar, multiecho pulse sequences of the brain and surrounding structures were obtained without intravenous  contrast. COMPARISON:  Head CT 10/05/2015 FINDINGS: Brain: Diffusion imaging does not show any acute infarction. The brainstem and cerebellum are normal. There is abnormal cortical and subcortical edema in the occipital and posterior parietal regions likely secondary to posterior reversible encephalopathy. No sign of hemorrhage. The remainder the brain is normal. No mass effect. No hydrocephalus. No extra-axial collection. Vascular: Major vessels at the base of the brain show flow. Skull and upper cervical spine: Negative Sinuses/Orbits: Clear/normal Other: None significant IMPRESSION: Abnormal cortical and subcortical edema of the cerebral hemispheres posteriorly consistent with posterior reversible encephalopathy. No evidence of hemorrhage or mass effect. Electronically Signed   By: Paulina FusiMark  Shogry M.D.   On: 10/06/2015 10:36   [place] Abdomen Complete  Result Date: 10/07/2015 CLINICAL DATA:  Hyperbilirubinemia.  History of cholecystectomy. EXAM: ABDOMEN ULTRASOUND COMPLETE COMPARISON:  Abdominal ultrasound of October 05, 2015 FINDINGS: Gallbladder: The gallbladder is surgically absent. Common bile duct: Diameter: 4 mm Liver: The hepatic echotexture is normal. There may be mild persistent intrahepatic ductal dilation centrally. IVC: No abnormality visualized. Pancreas: Visualized portion unremarkable. Spleen: Size and appearance within normal limits. Right Kidney: Length: 12.2 cm. Echogenicity within normal limits. No mass or hydronephrosis visualized. Left Kidney: Length: 12.4 cm. Echogenicity within normal limits. No mass or hydronephrosis visualized. Abdominal aorta: There is no abdominal aortic aneurysm. Other findings: Fluid is present in the left spleen no renal fossa. IMPRESSION: 1. The gallbladder is surgically absent. Mild intrahepatic ductal dilation observed. The common bile duct is normal at 4 mm. 2. No pancreatic mass nor other acute abnormality observed within the abdomen. Electronically Signed   By:  David  SwazilandJordan M.D.   On: 10/07/2015 13:02   [place] Abdomen Limited Ruq  Result Date: 10/05/2015 CLINICAL DATA:  Drug withdrawal.  Right abdominal pain for 2 days. EXAM: US ABDOMEN LIMITED - RIGHT UPPER QUADRANT COMPARISON:  None. FINDINGS: Gallbladder: Surgically absent, removed on 06/22/2008. Common bile duct: Diameter: 3 mm Liver: Moderate intrahepatic biliary dilatation, I do not see a focal liver lesion. IMPRESSION: 1. Abnormal intrahepatic biliary dilatation without extrahepatic biliary dilatation. I do not see a definite lesion to be causing obstruction, but in combination of the patient's elevated liver function tests and hyperbilirubinemia, the possibility of an otherwise occult obstructive process must be considered. Typically I would recommend MRI of the liver with and without contrast using MRCP protocol to assess for a [place] tumor or other cause for potential obstruction. However, the patient had extreme difficulty holding still during the ultrasound, and had difficulty cooperating, and I worry that we would not be able to obtain a diagnostic MRI due to the required breath-holding and stationary positioning. If upon assessment the patient seems unlikely to be able to hold still at MRI, consider dedicated hepatic protocol CT with and without contrast for further workup. Electronically Signed   By: Annitta NeedsWalter  Liebkemann M.D.  On: 10/05/2015 18:26    Microbiology: Recent Results (from the past 240 hour(s))  MRSA PCR Screening     Status: None   Collection Time: 10/05/15  5:35 PM  Result Value Ref Range Status   MRSA by PCR NEGATIVE NEGATIVE Final    Comment:        The GeneXpert MRSA Assay (FDA approved for NASAL specimens only), is one component of a comprehensive MRSA colonization surveillance program. It is not intended to diagnose MRSA infection nor to guide or monitor treatment for MRSA infections.   Gastrointestinal Panel by PCR , Stool     Status: None   Collection Time: 10/05/15  10:15 PM  Result Value Ref Range Status   Campylobacter species NOT DETECTED NOT DETECTED Final   Plesimonas shigelloides NOT DETECTED NOT DETECTED Final   Salmonella species NOT DETECTED NOT DETECTED Final   Yersinia enterocolitica NOT DETECTED NOT DETECTED Final   Vibrio species NOT DETECTED NOT DETECTED Final   Vibrio cholerae NOT DETECTED NOT DETECTED Final   Enteroaggregative E coli (EAEC) NOT DETECTED NOT DETECTED Final   Enteropathogenic E coli (EPEC) NOT DETECTED NOT DETECTED Final   Enterotoxigenic E coli (ETEC) NOT DETECTED NOT DETECTED Final   Shiga like toxin producing E coli (STEC) NOT DETECTED NOT DETECTED Final   E. coli O157 NOT DETECTED NOT DETECTED Final   Shigella/Enteroinvasive E coli (EIEC) NOT DETECTED NOT DETECTED Final   Cryptosporidium NOT DETECTED NOT DETECTED Final   Cyclospora cayetanensis NOT DETECTED NOT DETECTED Final   Entamoeba histolytica NOT DETECTED NOT DETECTED Final   Giardia lamblia NOT DETECTED NOT DETECTED Final   Adenovirus F40/41 NOT DETECTED NOT DETECTED Final   Astrovirus NOT DETECTED NOT DETECTED Final   Norovirus GI/GII NOT DETECTED NOT DETECTED Final   Rotavirus A NOT DETECTED NOT DETECTED Final   Sapovirus (I, II, IV, and V) NOT DETECTED NOT DETECTED Final     Labs: CBC:  Recent Labs Lab 10/13/15 0602  WBC 10.0  NEUTROABS 5.0  HGB 15.5*  HCT 44.5  MCV 86.9  PLT 297   Basic Metabolic Panel:  Recent Labs Lab 10/08/15 0400 10/10/15 1117 10/12/15 0610 10/13/15 0602  NA 140 137  --  133*  K 3.0* 3.8  --  4.6  CL 112* 106  --  101  CO2 24 21*  --  24  GLUCOSE 97 134*  --  103*  BUN 11 16  --  18  CREATININE 0.74 0.84 0.77 0.81  CALCIUM 8.1* 8.8*  --  9.0   Liver Function Tests:  Recent Labs Lab 10/08/15 0400 10/13/15 0602  AST 16 20  ALT 28 52  ALKPHOS 64 64  BILITOT 0.6 0.3  PROT 5.5* 6.9  ALBUMIN 2.9* 3.7   CBG:  Recent Labs Lab 10/09/15 0745 10/10/15 0735 10/11/15 0741 10/12/15 0759 10/13/15 0805   GLUCAP 108* 96 101* 125* 109*   Time spent: 30 minutes  Signed:  PATEL, PRANAV  [facility] 10/13/2015 , 1:22 PM

## 2015-10-31 ENCOUNTER — Inpatient Hospital Stay (HOSPITAL_COMMUNITY)
Admission: EM | Admit: 2015-10-31 | Discharge: 2015-11-03 | DRG: 391 | Disposition: A | Discharge status: Home / Self Care | Payer: Medicaid Other | Attending: Internal Medicine | Admitting: Internal Medicine

## 2015-10-31 ENCOUNTER — Encounter (HOSPITAL_COMMUNITY): Payer: Self-pay | Admitting: Cardiology

## 2015-10-31 DIAGNOSIS — Z79899 Other long term (current) drug therapy: Secondary | ICD-10-CM

## 2015-10-31 DIAGNOSIS — I10 Essential (primary) hypertension: Secondary | ICD-10-CM

## 2015-10-31 DIAGNOSIS — Z823 Family history of stroke: Secondary | ICD-10-CM

## 2015-10-31 DIAGNOSIS — R1084 Generalized abdominal pain: Secondary | ICD-10-CM | POA: Diagnosis not present

## 2015-10-31 DIAGNOSIS — F19230 Other psychoactive substance dependence with withdrawal, uncomplicated: Secondary | ICD-10-CM | POA: Diagnosis not present

## 2015-10-31 DIAGNOSIS — E43 Unspecified severe protein-calorie malnutrition: Secondary | ICD-10-CM | POA: Diagnosis present

## 2015-10-31 DIAGNOSIS — F1193 Opioid use, unspecified with withdrawal: Secondary | ICD-10-CM | POA: Diagnosis present

## 2015-10-31 DIAGNOSIS — Z8249 Family history of ischemic heart disease and other diseases of the circulatory system: Secondary | ICD-10-CM

## 2015-10-31 DIAGNOSIS — F1123 Opioid dependence with withdrawal: Secondary | ICD-10-CM | POA: Diagnosis present

## 2015-10-31 DIAGNOSIS — Z880 Allergy status to penicillin: Secondary | ICD-10-CM

## 2015-10-31 DIAGNOSIS — R112 Nausea with vomiting, unspecified: Secondary | ICD-10-CM | POA: Diagnosis not present

## 2015-10-31 DIAGNOSIS — F1410 Cocaine abuse, uncomplicated: Secondary | ICD-10-CM | POA: Diagnosis present

## 2015-10-31 DIAGNOSIS — R109 Unspecified abdominal pain: Secondary | ICD-10-CM | POA: Diagnosis present

## 2015-10-31 DIAGNOSIS — Z885 Allergy status to narcotic agent status: Secondary | ICD-10-CM

## 2015-10-31 DIAGNOSIS — Z681 Body mass index (BMI) 19 or less, adult: Secondary | ICD-10-CM

## 2015-10-31 DIAGNOSIS — E869 Volume depletion, unspecified: Secondary | ICD-10-CM

## 2015-10-31 DIAGNOSIS — Z9049 Acquired absence of other specified parts of digestive tract: Secondary | ICD-10-CM

## 2015-10-31 DIAGNOSIS — F1910 Other psychoactive substance abuse, uncomplicated: Secondary | ICD-10-CM

## 2015-10-31 DIAGNOSIS — R197 Diarrhea, unspecified: Secondary | ICD-10-CM

## 2015-10-31 DIAGNOSIS — E876 Hypokalemia: Secondary | ICD-10-CM

## 2015-10-31 DIAGNOSIS — F419 Anxiety disorder, unspecified: Secondary | ICD-10-CM | POA: Diagnosis present

## 2015-10-31 DIAGNOSIS — F17210 Nicotine dependence, cigarettes, uncomplicated: Secondary | ICD-10-CM | POA: Diagnosis present

## 2015-10-31 DIAGNOSIS — F329 Major depressive disorder, single episode, unspecified: Secondary | ICD-10-CM | POA: Diagnosis present

## 2015-10-31 DIAGNOSIS — F1120 Opioid dependence, uncomplicated: Secondary | ICD-10-CM | POA: Diagnosis present

## 2015-10-31 LAB — URINALYSIS, ROUTINE W REFLEX MICROSCOPIC
BILIRUBIN URINE: NEGATIVE
Glucose, UA: NEGATIVE mg/dL
Hgb urine dipstick: NEGATIVE
Ketones, ur: 15 mg/dL — AB
Leukocytes, UA: NEGATIVE
NITRITE: NEGATIVE
Protein, ur: NEGATIVE mg/dL
Specific Gravity, Urine: 1.015 (ref 1.005–1.030)

## 2015-10-31 LAB — COMPREHENSIVE METABOLIC PANEL
ALBUMIN: 3.8 g/dL (ref 3.5–5.0)
ALK PHOS: 126 U/L (ref 38–126)
ALT: 33 U/L (ref 14–54)
ANION GAP: 9 (ref 5–15)
AST: 20 U/L (ref 15–41)
BUN: 19 mg/dL (ref 6–20)
CALCIUM: 9 mg/dL (ref 8.9–10.3)
CHLORIDE: 102 mmol/L (ref 101–111)
CO2: 25 mmol/L (ref 22–32)
Creatinine, Ser: 0.99 mg/dL (ref 0.44–1.00)
GFR calc Af Amer: >60 mL/min (ref >60)
GFR calc non Af Amer: >60 mL/min (ref >60)
GLUCOSE: 137 mg/dL — AB (ref 65–99)
Potassium: 2.6 mmol/L — CL (ref 3.5–5.1)
Sodium: 136 mmol/L (ref 135–145)
Total Bilirubin: 1.2 mg/dL (ref 0.3–1.2)
Total Protein: 7.6 g/dL (ref 6.5–8.1)

## 2015-10-31 LAB — CBC WITH DIFFERENTIAL/PLATELET
BASOS ABS: 0.1 10*3/uL (ref 0.0–0.1)
BASOS PCT: 1 %
Eosinophils Absolute: 0.1 10*3/uL (ref 0.0–0.7)
Eosinophils Relative: 1 %
HEMATOCRIT: 43 % (ref 36.0–46.0)
HEMOGLOBIN: 15.9 g/dL — AB (ref 12.0–15.0)
LYMPHS PCT: 22 %
Lymphs Abs: 2.3 10*3/uL (ref 0.7–4.0)
MCH: 31.2 pg (ref 26.0–34.0)
MCHC: 37 g/dL — ABNORMAL HIGH (ref 30.0–36.0)
MCV: 84.3 fL (ref 78.0–100.0)
MONO ABS: 0.6 10*3/uL (ref 0.1–1.0)
Monocytes Relative: 6 %
NEUTROS ABS: 7.3 10*3/uL (ref 1.7–7.7)
NEUTROS PCT: 71 %
Platelets: 307 10*3/uL (ref 150–400)
RBC: 5.1 MIL/uL (ref 3.87–5.11)
RDW: 12.9 % (ref 11.5–15.5)
WBC: 10.3 10*3/uL (ref 4.0–10.5)

## 2015-10-31 LAB — LIPASE, BLOOD: Lipase: 22 U/L (ref 11–51)

## 2015-10-31 LAB — RAPID URINE DRUG SCREEN, HOSP PERFORMED
Amphetamines: NOT DETECTED
BARBITURATES: NOT DETECTED
Benzodiazepines: NOT DETECTED
COCAINE: POSITIVE — AB
OPIATES: POSITIVE — AB
TETRAHYDROCANNABINOL: NOT DETECTED

## 2015-10-31 LAB — POC URINE PREG, ED: Preg Test, Ur: NEGATIVE

## 2015-10-31 LAB — TROPONIN I: Troponin I: <0.03 ng/mL (ref <0.03)

## 2015-10-31 LAB — ETHANOL: Alcohol, Ethyl (B): <5 mg/dL (ref <5)

## 2015-10-31 MED ORDER — LORAZEPAM 2 MG/ML IJ SOLN: 0.0000 mg | Freq: Two times a day (BID) | INTRAMUSCULAR | Status: DC

## 2015-10-31 MED ORDER — FLUOXETINE HCL 20 MG PO CAPS
20.0000 mg | ORAL_CAPSULE | Freq: Every day | ORAL | Status: DC
  Administered 2015-11-01 – 2015-11-03 (×4): 20 mg via ORAL
  Filled 2015-10-31 (×3): qty 1

## 2015-10-31 MED ORDER — THIAMINE HCL 100 MG/ML IJ SOLN
Freq: Once | INTRAMUSCULAR | Status: AC
  Administered 2015-10-31: 22:00:00 via INTRAVENOUS
  Filled 2015-10-31: qty 1000

## 2015-10-31 MED ORDER — NICOTINE 14 MG/24HR TD PT24
14.0000 mg | MEDICATED_PATCH | Freq: Every day | TRANSDERMAL | Status: DC
  Administered 2015-10-31 – 2015-11-03 (×4): 14 mg via TRANSDERMAL
  Filled 2015-10-31 (×4): qty 1

## 2015-10-31 MED ORDER — LORAZEPAM 2 MG/ML IJ SOLN
0.5000 mg | Freq: Once | INTRAMUSCULAR | Status: AC
  Administered 2015-10-31: 0.5 mg via INTRAVENOUS
  Filled 2015-10-31: qty 1

## 2015-10-31 MED ORDER — THIAMINE HCL 100 MG/ML IJ SOLN
INTRAMUSCULAR | Status: AC
  Filled 2015-10-31: qty 2

## 2015-10-31 MED ORDER — PROMETHAZINE HCL 25 MG/ML IJ SOLN
12.5000 mg | Freq: Once | INTRAMUSCULAR | Status: AC
  Administered 2015-10-31: 12.5 mg via INTRAVENOUS
  Filled 2015-10-31: qty 1

## 2015-10-31 MED ORDER — ACETAMINOPHEN 325 MG PO TABS: 650.0000 mg | ORAL_TABLET | Freq: Four times a day (QID) | ORAL | Status: DC | PRN

## 2015-10-31 MED ORDER — ACETAMINOPHEN 650 MG RE SUPP: 650.0000 mg | Freq: Four times a day (QID) | RECTAL | Status: DC | PRN

## 2015-10-31 MED ORDER — IBUPROFEN 400 MG PO TABS
400.0000 mg | ORAL_TABLET | Freq: Four times a day (QID) | ORAL | Status: DC | PRN
  Administered 2015-11-02: 400 mg via ORAL
  Filled 2015-10-31: qty 1

## 2015-10-31 MED ORDER — POTASSIUM CHLORIDE 10 MEQ/100ML IV SOLN
10.0000 meq | Freq: Once | INTRAVENOUS | Status: AC
  Administered 2015-10-31: 10 meq via INTRAVENOUS
  Filled 2015-10-31: qty 100

## 2015-10-31 MED ORDER — ONDANSETRON HCL 4 MG PO TABS: 4.0000 mg | ORAL_TABLET | Freq: Four times a day (QID) | ORAL | Status: DC | PRN

## 2015-10-31 MED ORDER — LABETALOL HCL 5 MG/ML IV SOLN
20.0000 mg | INTRAVENOUS | Status: DC | PRN
  Administered 2015-11-01: 20 mg via INTRAVENOUS
  Filled 2015-10-31: qty 4

## 2015-10-31 MED ORDER — CLONIDINE HCL 0.1 MG PO TABS
0.1000 mg | ORAL_TABLET | Freq: Once | ORAL | Status: AC
  Administered 2015-10-31: 0.1 mg via ORAL
  Filled 2015-10-31: qty 1

## 2015-10-31 MED ORDER — SODIUM CHLORIDE 0.9 % IV BOLUS (SEPSIS)
1000.0000 mL | Freq: Once | INTRAVENOUS | Status: AC
  Administered 2015-10-31: 1000 mL via INTRAVENOUS

## 2015-10-31 MED ORDER — BENAZEPRIL HCL 10 MG PO TABS
5.0000 mg | ORAL_TABLET | Freq: Every day | ORAL | Status: DC
  Administered 2015-11-01 – 2015-11-03 (×4): 5 mg via ORAL
  Filled 2015-10-31 (×3): qty 1

## 2015-10-31 MED ORDER — PROMETHAZINE HCL 25 MG/ML IJ SOLN
12.5000 mg | Freq: Four times a day (QID) | INTRAMUSCULAR | Status: DC | PRN
  Administered 2015-11-01: 12.5 mg via INTRAVENOUS
  Administered 2015-11-01 – 2015-11-03 (×5): 25 mg via INTRAVENOUS
  Filled 2015-10-31 (×5): qty 1

## 2015-10-31 MED ORDER — LORAZEPAM 2 MG/ML IJ SOLN
0.0000 mg | Freq: Four times a day (QID) | INTRAMUSCULAR | Status: DC
  Administered 2015-10-31 – 2015-11-02 (×3): 2 mg via INTRAVENOUS
  Filled 2015-10-31 (×5): qty 1

## 2015-10-31 MED ORDER — PROCHLORPERAZINE EDISYLATE 5 MG/ML IJ SOLN
10.0000 mg | Freq: Once | INTRAMUSCULAR | Status: AC
  Administered 2015-10-31: 10 mg via INTRAVENOUS
  Filled 2015-10-31: qty 2

## 2015-10-31 MED ORDER — ONDANSETRON HCL 4 MG/2ML IJ SOLN
4.0000 mg | Freq: Once | INTRAMUSCULAR | Status: AC
  Administered 2015-10-31: 4 mg via INTRAVENOUS
  Filled 2015-10-31: qty 2

## 2015-10-31 MED ORDER — ENOXAPARIN SODIUM 40 MG/0.4ML ~~LOC~~ SOLN
40.0000 mg | SUBCUTANEOUS | Status: DC
  Administered 2015-10-31 – 2015-11-01 (×2): 40 mg via SUBCUTANEOUS
  Filled 2015-10-31 (×3): qty 0.4

## 2015-10-31 MED ORDER — DEXAMETHASONE SODIUM PHOSPHATE 10 MG/ML IJ SOLN
10.0000 mg | Freq: Once | INTRAMUSCULAR | Status: AC
  Administered 2015-10-31: 10 mg via INTRAVENOUS
  Filled 2015-10-31: qty 1

## 2015-10-31 MED ORDER — METOCLOPRAMIDE HCL 5 MG/ML IJ SOLN
10.0000 mg | Freq: Once | INTRAMUSCULAR | Status: AC
  Administered 2015-10-31: 10 mg via INTRAVENOUS
  Filled 2015-10-31: qty 2

## 2015-10-31 MED ORDER — FOLIC ACID 5 MG/ML IJ SOLN
INTRAMUSCULAR | Status: AC
  Filled 2015-10-31: qty 0.2

## 2015-10-31 MED ORDER — CLONIDINE HCL 0.1 MG PO TABS
0.1000 mg | ORAL_TABLET | Freq: Every day | ORAL | Status: DC
  Administered 2015-11-01: 0.1 mg via ORAL
  Filled 2015-10-31 (×2): qty 1

## 2015-10-31 MED ORDER — POTASSIUM CHLORIDE 20 MEQ PO PACK
40.0000 meq | PACK | Freq: Two times a day (BID) | ORAL | Status: DC
  Administered 2015-10-31: 40 meq via ORAL
  Filled 2015-10-31: qty 2

## 2015-10-31 MED ORDER — ONDANSETRON HCL 4 MG/2ML IJ SOLN
4.0000 mg | Freq: Four times a day (QID) | INTRAMUSCULAR | Status: DC | PRN
  Administered 2015-11-02 – 2015-11-03 (×3): 4 mg via INTRAVENOUS
  Filled 2015-10-31 (×3): qty 2

## 2015-10-31 MED ORDER — LORAZEPAM 2 MG/ML IJ SOLN
1.0000 mg | Freq: Four times a day (QID) | INTRAMUSCULAR | Status: DC | PRN
  Administered 2015-11-01: 1 mg via INTRAVENOUS

## 2015-10-31 MED ORDER — METOPROLOL TARTRATE 50 MG PO TABS
50.0000 mg | ORAL_TABLET | Freq: Two times a day (BID) | ORAL | Status: DC
  Administered 2015-10-31 – 2015-11-03 (×7): 50 mg via ORAL
  Filled 2015-10-31 (×6): qty 1

## 2015-10-31 MED ORDER — DIPHENHYDRAMINE HCL 50 MG/ML IJ SOLN
12.5000 mg | Freq: Once | INTRAMUSCULAR | Status: AC
  Administered 2015-10-31: 12.5 mg via INTRAVENOUS
  Filled 2015-10-31: qty 1

## 2015-10-31 MED ORDER — M.V.I. ADULT IV INJ
INJECTION | INTRAVENOUS | Status: AC
  Filled 2015-10-31: qty 10

## 2015-10-31 MED ORDER — KETOROLAC TROMETHAMINE 30 MG/ML IJ SOLN
10.0000 mg | Freq: Once | INTRAMUSCULAR | Status: AC
  Administered 2015-10-31: 9.9 mg via INTRAMUSCULAR
  Filled 2015-10-31: qty 1

## 2015-10-31 MED ORDER — LORAZEPAM 1 MG PO TABS: 1.0000 mg | ORAL_TABLET | Freq: Four times a day (QID) | ORAL | Status: DC | PRN

## 2015-10-31 MED ORDER — POTASSIUM CHLORIDE 10 MEQ/100ML IV SOLN
10.0000 meq | INTRAVENOUS | Status: AC
  Administered 2015-10-31 (×3): 10 meq via INTRAVENOUS
  Filled 2015-10-31 (×3): qty 100

## 2015-10-31 NOTE — ED Notes (Addendum)
Attempted to obtain clean catch urine specimen, pt refused. Pt was offered an in and out cath because she has been having diarrhea, pt also refused this.
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
## 2015-10-31 NOTE — ED Triage Notes (Signed)
Pt d/cid from hospital last week with HTN.  States that she was taken off her klonopin and has felt sick all week. Vomiting  today. B/p with EMS 168/124. EMS gave 4 mg zofran
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
## 2015-10-31 NOTE — ED Notes (Signed)
Rep from lab at bedside

## 2015-10-31 NOTE — ED Notes (Signed)
Attempted to call Pt daughter Marcelino DusterMichelle @ [phone] to advise pt was being moved to floor. No answer & no mailbox set up.
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
## 2015-11-01 ENCOUNTER — Encounter (HOSPITAL_COMMUNITY): Payer: Self-pay | Admitting: Student

## 2015-11-01 DIAGNOSIS — Z9049 Acquired absence of other specified parts of digestive tract: Secondary | ICD-10-CM | POA: Diagnosis not present

## 2015-11-01 DIAGNOSIS — R112 Nausea with vomiting, unspecified: Secondary | ICD-10-CM | POA: Diagnosis present

## 2015-11-01 DIAGNOSIS — F1120 Opioid dependence, uncomplicated: Secondary | ICD-10-CM | POA: Diagnosis not present

## 2015-11-01 DIAGNOSIS — E869 Volume depletion, unspecified: Secondary | ICD-10-CM | POA: Diagnosis present

## 2015-11-01 DIAGNOSIS — Z8249 Family history of ischemic heart disease and other diseases of the circulatory system: Secondary | ICD-10-CM | POA: Diagnosis not present

## 2015-11-01 DIAGNOSIS — I10 Essential (primary) hypertension: Secondary | ICD-10-CM | POA: Diagnosis present

## 2015-11-01 DIAGNOSIS — Z681 Body mass index (BMI) 19 or less, adult: Secondary | ICD-10-CM | POA: Diagnosis not present

## 2015-11-01 DIAGNOSIS — Z880 Allergy status to penicillin: Secondary | ICD-10-CM | POA: Diagnosis not present

## 2015-11-01 DIAGNOSIS — F1410 Cocaine abuse, uncomplicated: Secondary | ICD-10-CM | POA: Diagnosis present

## 2015-11-01 DIAGNOSIS — F17210 Nicotine dependence, cigarettes, uncomplicated: Secondary | ICD-10-CM | POA: Diagnosis present

## 2015-11-01 DIAGNOSIS — F19230 Other psychoactive substance dependence with withdrawal, uncomplicated: Secondary | ICD-10-CM | POA: Diagnosis not present

## 2015-11-01 DIAGNOSIS — E43 Unspecified severe protein-calorie malnutrition: Secondary | ICD-10-CM | POA: Diagnosis present

## 2015-11-01 DIAGNOSIS — Z823 Family history of stroke: Secondary | ICD-10-CM | POA: Diagnosis not present

## 2015-11-01 DIAGNOSIS — F419 Anxiety disorder, unspecified: Secondary | ICD-10-CM | POA: Diagnosis present

## 2015-11-01 DIAGNOSIS — Z79899 Other long term (current) drug therapy: Secondary | ICD-10-CM | POA: Diagnosis not present

## 2015-11-01 DIAGNOSIS — E876 Hypokalemia: Secondary | ICD-10-CM | POA: Diagnosis present

## 2015-11-01 DIAGNOSIS — F1123 Opioid dependence with withdrawal: Secondary | ICD-10-CM | POA: Diagnosis present

## 2015-11-01 DIAGNOSIS — F329 Major depressive disorder, single episode, unspecified: Secondary | ICD-10-CM | POA: Diagnosis present

## 2015-11-01 DIAGNOSIS — Z885 Allergy status to narcotic agent status: Secondary | ICD-10-CM | POA: Diagnosis not present

## 2015-11-01 LAB — BASIC METABOLIC PANEL
Anion gap: 8 (ref 5–15)
BUN: 17 mg/dL (ref 6–20)
CHLORIDE: 104 mmol/L (ref 101–111)
CO2: 23 mmol/L (ref 22–32)
CREATININE: 0.99 mg/dL (ref 0.44–1.00)
Calcium: 8.8 mg/dL — ABNORMAL LOW (ref 8.9–10.3)
GFR calc Af Amer: >60 mL/min (ref >60)
GFR calc non Af Amer: >60 mL/min (ref >60)
Glucose, Bld: 125 mg/dL — ABNORMAL HIGH (ref 65–99)
Potassium: 3.1 mmol/L — ABNORMAL LOW (ref 3.5–5.1)
SODIUM: 135 mmol/L (ref 135–145)

## 2015-11-01 MED ORDER — ADULT MULTIVITAMIN W/MINERALS CH
1.0000 | ORAL_TABLET | Freq: Every day | ORAL | Status: DC
  Administered 2015-11-02 – 2015-11-03 (×2): 1 via ORAL
  Filled 2015-11-01 (×2): qty 1

## 2015-11-01 MED ORDER — CLONIDINE HCL 0.1 MG PO TABS
0.1000 mg | ORAL_TABLET | Freq: Three times a day (TID) | ORAL | Status: DC
  Administered 2015-11-01 – 2015-11-02 (×2): 0.1 mg via ORAL
  Filled 2015-11-01 (×3): qty 1

## 2015-11-01 MED ORDER — LORAZEPAM 1 MG PO TABS
1.0000 mg | ORAL_TABLET | Freq: Two times a day (BID) | ORAL | Status: DC
  Administered 2015-11-01 – 2015-11-02 (×3): 1 mg via ORAL
  Filled 2015-11-01 (×3): qty 1

## 2015-11-01 MED ORDER — BOOST / RESOURCE BREEZE PO LIQD
1.0000 | Freq: Three times a day (TID) | ORAL | Status: DC
  Administered 2015-11-02 (×2): 1 via ORAL

## 2015-11-01 MED ORDER — KCL IN DEXTROSE-NACL 40-5-0.9 MEQ/L-%-% IV SOLN
INTRAVENOUS | Status: DC
  Administered 2015-11-01 – 2015-11-02 (×3): via INTRAVENOUS
  Filled 2015-11-01 (×8): qty 1000

## 2015-11-01 NOTE — Clinical Social Work Note (Signed)
CSW attempted multiple times to awaken patient.  Patient was unable to be aroused.  CSW will attempt again to assess.  CSW assessed patient for same referral reason on 10/11/15.    Iolani Twilley, Juleen ChinaHeather D, LCSW

## 2015-11-01 NOTE — Progress Notes (Signed)
PROGRESS NOTE    Jamie Evans  GEX:[id number] DOB: 11/20/1970 DOA: 10/31/2015 [facility] (Inactive)    Brief Narrative: Patient with hx of opoid IV drug abuse, hx of polysubstance abuse, HTN, benzodiazepine dependency, anxiety, tobacco abuse, but denied alcohol abuse, admitted for sign and symptoms of withdrawal.  She was given Ativan CIWA protocol.  She still has moderate nausea and vomting.  She was also found to have hypokalemia, and was supplemented.    Assessment & Plan:   Principal Problem:   Withdrawal from opioids [facility](HCC) Active Problems:   Hypokalemia   Essential hypertension   Substance abuse   Chronic narcotic dependence (HCC)   IV drug abuse   Volume depletion   Nausea vomiting and diarrhea   Abdominal pain   Acute narcotic withdrawal without complication (HCC)   Polysubstance abuse:   Withdrawal symptoms.  Will add clonidine for symptomatic Tx.  No opoid, but add RTC low dose benzo.   Hypokalemia:  Will continue with IV supplement in the fluid. HTN:  Currently on BB and clonidine was added.  Will follow.   DVT prophylaxis: Lovenox Code Status: FULL CODE.  Family Communication: Her 2 daughters Marcelino DusterMichelle and ClydeMichaela at bedside with patient's persmission.  Disposition Plan:  Home when appropriate.   Consultants:   None.   Procedures:   None.   Antimicrobials: Anti-infectives    None       Subjective: feeling better.  Nausea.   Objective: Vitals:   10/31/15 2114 11/01/15 0000 11/01/15 0458 11/01/15 0500  BP: (!) 165/91 (!) 158/93 (!) 163/99   Pulse: 79 74 73   Resp: 20  20   Temp: 97.7 F (36.5 C)  97.6 F (36.4 C)   TempSrc: Oral  Oral   SpO2: 100%  99%   Weight:    52.7 kg (116 lb 4.3 oz)  Height:        Intake/Output Summary (Last 24 hours) at 11/01/15 0942 Last data filed at 11/01/15 0307  Gross per 24 hour  Intake             3500 ml  Output                0 ml  Net             3500 ml   Filed  Weights   10/31/15 1351 10/31/15 1426 11/01/15 0500  Weight: 52.6 kg (116 lb) 52.6 kg (116 lb) 52.7 kg (116 lb 4.3 oz)    Examination:  General exam: Appears calm and comfortable  Respiratory system: Clear to auscultation. Respiratory effort normal. Cardiovascular system: S1 & S2 heard, RRR. No JVD, murmurs, rubs, gallops or clicks. No pedal edema. Gastrointestinal system: Abdomen is nondistended, soft and nontender. No organomegaly or masses felt. Normal bowel sounds heard. Central nervous system: Alert and oriented. No focal neurological deficits. Extremities: Symmetric 5 x 5 power. Skin: No rashes, lesions or ulcers Psychiatry: Judgement and insight appear normal. Mood & affect appropriate.   Data Reviewed: I have personally reviewed following labs and imaging studies  CBC:  Recent Labs Lab 10/31/15 1446  WBC 10.3  NEUTROABS 7.3  HGB 15.9*  HCT 43.0  MCV 84.3  PLT 307   Basic Metabolic Panel:  Recent Labs Lab 10/31/15 1446 11/01/15 0650  NA 136 135  K 2.6* 3.1*  CL 102 104  CO2 25 23  GLUCOSE 137* 125*  BUN 19 17  CREATININE 0.99 0.99  CALCIUM 9.0 8.8*  GFR: Estimated Creatinine Clearance: 59.7 mL/min (by C-G formula based on SCr of 0.99 mg/dL). Liver Function Tests:  Recent Labs Lab 10/31/15 1446  AST 20  ALT 33  ALKPHOS 126  BILITOT 1.2  PROT 7.6  ALBUMIN 3.8    Recent Labs Lab 10/31/15 1446  LIPASE 22   No results for input(s): AMMONIA in the last 168 hours. Coagulation Profile: No results for input(s): INR, PROTIME in the last 168 hours. Cardiac Enzymes:  Recent Labs Lab 10/31/15 1446  TROPONINI <0.03   No results found for this or any previous visit (from the past 240 hour(s)).   Radiology Studies: No results found.  Scheduled Meds: . benazepril  5 mg Oral Daily  . cloNIDine  0.1 mg Oral Daily  . cloNIDine  0.1 mg Oral TID  . enoxaparin (LOVENOX) injection  40 mg Subcutaneous Q24H  . FLUoxetine  20 mg Oral Daily  .  LORazepam  0-4 mg Intravenous Q6H   Followed by  . [START ON 11/02/2015] LORazepam  0-4 mg Intravenous Q12H  . LORazepam  1 mg Oral BID  . metoprolol  50 mg Oral BID  . nicotine  14 mg Transdermal Daily   Continuous Infusions: . dextrose 5 % and 0.9 % NaCl with KCl 40 mEq/L       LOS: 0 days   Ciani Rutten, MD FACP Hospitalist.   If 7PM-7AM, please contact night-coverage [email or web address] Password [email or web address] 11/01/2015, 9:42 AM

## 2015-11-01 NOTE — Progress Notes (Signed)
Initial Nutrition Assessment Pt meets criteria for severe MALNUTRITION in the context of chronic illness (polysubstance abuse) as evidenced by >20% unplanned wt loss and </= 75% energy intake for >/= 1 month.  INTERVENTION:  Clear liquids currently  Advance to regular diet as tolerated  Boost Breeze po TID, each supplement provides 250 kcal and 9 grams of protein    NUTRITION DIAGNOSIS:  Malnutrition related to chronic illness (polysubstance abuse) AEB severe weight loss (>20% 1 year), and </= 75% energy intake for >/= 1 month.    GOAL: Pt to meet >/= 90% of their estimated nutrition needs      MONITOR: Diet advancement and tolerance , Po intake, labs and wt trends     REASON FOR ASSESSMENT: Malnutrition Screen    ASSESSMENT:  [name] with medical history of mental disorder, substance abuse who presents to the ED with altered mental status.   She is awake during visit and c/o diarrhea.  Pt says she has not been eating since she left the hospital earlier this month. Her weight is down severely >5% since she was discharged in < 30 days and >20% in the past year. This is likely related to her hx of IV drug abuse. She has had nausea and vomiting several days prior to admission but says she has been able to drink the clear liquids so far today.  Partial Nutrition-Focused physical exam completed. Patient is fully dressed . Findings are moderate orbital fat depletion, moderate temporal muscle depletion, and no edema.   Recent Labs Lab 10/31/15 1446 11/01/15 0650  NA 136 135  K 2.6* 3.1*  CL 102 104  CO2 25 23  BUN 19 17  CREATININE 0.99 0.99  CALCIUM 9.0 8.8*  GLUCOSE 137* 125*   Labs: potassium- improving  Meds: reviewed    Diet Order:  Diet clear liquid Room service appropriate? Yes; Fluid consistency: Thin  Skin:    intact  Last BM:   10/2 diarrhea  Height:   Ht Readings from Last 1 Encounters:  10/31/15 5\' 4"  (1.626 m)    Weight:   Wt Readings  from Last 1 Encounters:  11/01/15 116 lb 4.3 oz (52.7 kg)    Ideal Body Weight:   54.5 kg  BMI:  Body mass index is 19.96 kg/m. normal  Estimated Nutritional Needs:   Kcal:   1650-1880   Protein:   84-90 gr  Fluid:   1.7-1.9 liters daily  EDUCATION NEEDS: none identified at this time    Jamie ShiversLynn Gayna Braddy MS,RD,CSG,LDN Office: #[phone] Pager: [phone]

## 2015-11-02 LAB — COMPREHENSIVE METABOLIC PANEL
ALK PHOS: 85 U/L (ref 38–126)
ALT: 21 U/L (ref 14–54)
AST: 20 U/L (ref 15–41)
Albumin: 3 g/dL — ABNORMAL LOW (ref 3.5–5.0)
Anion gap: 6 (ref 5–15)
BILIRUBIN TOTAL: 0.6 mg/dL (ref 0.3–1.2)
BUN: 15 mg/dL (ref 6–20)
CALCIUM: 8.7 mg/dL — AB (ref 8.9–10.3)
CO2: 25 mmol/L (ref 22–32)
CREATININE: 0.97 mg/dL (ref 0.44–1.00)
Chloride: 110 mmol/L (ref 101–111)
Glucose, Bld: 118 mg/dL — ABNORMAL HIGH (ref 65–99)
Potassium: 3.6 mmol/L (ref 3.5–5.1)
Sodium: 141 mmol/L (ref 135–145)
Total Protein: 6.1 g/dL — ABNORMAL LOW (ref 6.5–8.1)

## 2015-11-02 LAB — URINALYSIS, ROUTINE W REFLEX MICROSCOPIC
Bilirubin Urine: NEGATIVE
GLUCOSE, UA: NEGATIVE mg/dL
HGB URINE DIPSTICK: NEGATIVE
Ketones, ur: NEGATIVE mg/dL
Leukocytes, UA: NEGATIVE
Nitrite: NEGATIVE
PH: 5.5 (ref 5.0–8.0)
Protein, ur: NEGATIVE mg/dL
SPECIFIC GRAVITY, URINE: 1.02 (ref 1.005–1.030)

## 2015-11-02 MED ORDER — DIPHENOXYLATE-ATROPINE 2.5-0.025 MG/5ML PO LIQD: 5.0000 mL | Freq: Four times a day (QID) | ORAL | Status: DC | PRN

## 2015-11-02 MED ORDER — LORAZEPAM 1 MG PO TABS: 1.0000 mg | ORAL_TABLET | Freq: Four times a day (QID) | ORAL | Status: DC | PRN

## 2015-11-02 MED ORDER — LORAZEPAM 2 MG/ML IJ SOLN: 1.0000 mg | Freq: Four times a day (QID) | INTRAMUSCULAR | Status: DC | PRN

## 2015-11-02 MED ORDER — LORAZEPAM 0.5 MG PO TABS
0.5000 mg | ORAL_TABLET | Freq: Two times a day (BID) | ORAL | Status: DC
  Administered 2015-11-02 – 2015-11-03 (×2): 0.5 mg via ORAL
  Filled 2015-11-02 (×2): qty 1

## 2015-11-02 MED ORDER — LORAZEPAM 2 MG/ML IJ SOLN
1.0000 mg | Freq: Four times a day (QID) | INTRAMUSCULAR | Status: DC | PRN
  Administered 2015-11-02 – 2015-11-03 (×2): 1 mg via INTRAVENOUS
  Filled 2015-11-02 (×4): qty 1

## 2015-11-02 MED ORDER — LORAZEPAM 2 MG/ML IJ SOLN
0.0000 mg | Freq: Four times a day (QID) | INTRAMUSCULAR | Status: DC
  Administered 2015-11-02: 2 mg via INTRAVENOUS
  Filled 2015-11-02: qty 1

## 2015-11-02 MED ORDER — LORAZEPAM 2 MG/ML IJ SOLN
0.0000 mg | Freq: Two times a day (BID) | INTRAMUSCULAR | Status: DC
Start: 2015-11-03 — End: 2015-11-02

## 2015-11-02 MED ORDER — DIPHENOXYLATE-ATROPINE 2.5-0.025 MG PO TABS
1.0000 | ORAL_TABLET | Freq: Four times a day (QID) | ORAL | Status: DC | PRN
  Administered 2015-11-02: 1 via ORAL
  Filled 2015-11-02 (×2): qty 1

## 2015-11-02 MED ORDER — CLONIDINE HCL 0.1 MG PO TABS
0.1000 mg | ORAL_TABLET | Freq: Two times a day (BID) | ORAL | Status: DC
  Administered 2015-11-02 – 2015-11-03 (×2): 0.1 mg via ORAL
  Filled 2015-11-02 (×2): qty 1

## 2015-11-02 NOTE — Progress Notes (Signed)
PROGRESS NOTE    Jamie Evans  ZOX:[id number] DOB: [birth year] DOA: 10/31/2015 [facility]CP: [facility] (Inactive)    Brief Narrative:  Patient with hx of opoid IV drug abuse, hx of polysubstance abuse, HTN, benzodiazepine dependency, anxiety, tobacco abuse, but denied alcohol abuse, admitted for sign and symptoms of withdrawal.  She was given Ativan CIWA protocol.  She still has moderate nausea and vomting.  She was also found to have hypokalemia, and was supplemented.  She no longer has significant withdrawal symptoms, but she is sleepy now.   Assessment & Plan:   Principal Problem:   Withdrawal from opioids [facility]) Active Problems:   Hypokalemia   Essential hypertension   Substance abuse   Chronic narcotic dependence (HCC)   IV drug abuse   Volume depletion   Nausea vomiting and diarrhea   Abdominal pain   Acute narcotic withdrawal without complication (HCC)  1. Polysubstance abuse.  She is having less withdrawal symptoms. Will taper clonidine and benzo for symptomatic treatment.  Case manager is working on following up appointment with Dr Gerilyn Pilgrim for Suboxone Tx outpatient.  2. Hypokalemia:  Will continue with IV supplement in the fluid.  3. HTN:  Currently on BB and clonidine was added.  Will follow. 4. Malnutrition. Will place her on a full liquid diet.   DVT prophylaxis: Lovenox  Code Status: FULL CODE  Family Communication: Daughters bedside Disposition Plan: Discharge home once improved.    Consultants:   None   Procedures:   None   Antimicrobials:  None    Subjective: Doesn't feel well. Per daughters she hasn't been sleeping well for the past few days. Hasn't been eating. Diarrhea and nausea remains.   Objective: Vitals:   11/01/15 1700 11/01/15 2118 11/02/15 0500 11/02/15 0633  BP: (!) 161/106 (!) 116/101  (!) 147/88  Pulse: 75 79  77  Resp:  20  20  Temp:  97 F (36.1 C)  98.2 F (36.8 C)  TempSrc:  Oral  Oral  SpO2:  100%   100%  Weight:   51.9 kg (114 lb 5.6 oz)   Height:       No intake or output data in the 24 hours ending 11/02/15 1016 Filed Weights   10/31/15 1426 11/01/15 0500 11/02/15 0500  Weight: 52.6 kg (116 lb) 52.7 kg (116 lb 4.3 oz) 51.9 kg (114 lb 5.6 oz)    Examination:  General exam: Appears calm and comfortable  Respiratory system: Clear to auscultation. Respiratory effort normal. Cardiovascular system: S1 & S2 heard, RRR. No JVD, murmurs, rubs, gallops or clicks. No pedal edema. Gastrointestinal system: Abdomen is nondistended, soft and nontender. No organomegaly or masses felt. Normal bowel sounds heard. Central nervous system: Alert and oriented. No focal neurological deficits. Extremities: Symmetric 5 x 5 power. Skin: No rashes, lesions or ulcers Psychiatry: Judgement and insight appear normal. Mood & affect appropriate.     Data Reviewed: I have personally reviewed following labs and imaging studies  CBC:  Recent Labs Lab 10/31/15 1446  WBC 10.3  NEUTROABS 7.3  HGB 15.9*  HCT 43.0  MCV 84.3  PLT 307   Basic Metabolic Panel:  Recent Labs Lab 10/31/15 1446 11/01/15 0650 11/02/15 0556  NA 136 135 141  K 2.6* 3.1* 3.6  CL 102 104 110  CO2 25 23 25   GLUCOSE 137* 125* 118*  BUN 19 17 15   CREATININE 0.99 0.99 0.97  CALCIUM 9.0 8.8* 8.7*   GFR: Estimated Creatinine Clearance: 60 mL/min (  by C-G formula based on SCr of 0.97 mg/dL). Liver Function Tests:   Recent Labs Lab 10/31/15 1446 11/02/15 0556  AST 20 20  ALT 33 21  ALKPHOS 126 85  BILITOT 1.2 0.6  PROT 7.6 6.1*  ALBUMIN 3.8 3.0*    Recent Labs Lab 10/31/15 1446  LIPASE 22    Recent Labs Lab 10/31/15 1446  TROPONINI <0.03   Radiology Studies: Scheduled Meds: . benazepril  5 mg Oral Daily  . cloNIDine  0.1 mg Oral Daily  . cloNIDine  0.1 mg Oral TID  . enoxaparin (LOVENOX) injection  40 mg Subcutaneous Q24H  . feeding supplement  1 Container Oral TID BM  . FLUoxetine  20 mg Oral  Daily  . LORazepam  0-4 mg Intravenous Q6H   Followed by  . [START ON 11/03/2015] LORazepam  0-4 mg Intravenous Q12H  . LORazepam  1 mg Oral BID  . metoprolol  50 mg Oral BID  . multivitamin with minerals  1 tablet Oral Daily  . nicotine  14 mg Transdermal Daily   Continuous Infusions: . dextrose 5 % and 0.9 % NaCl with KCl 40 mEq/L 100 mL/hr at 11/02/15 0914     LOS: 1 day   Time spent: 25 minutes   Houston SirenPeter Terianne Thaker, MD FACP [facility] If 7PM-7AM, please contact night-coverage [email or web address] Password [email or web address] 11/02/2015, 10:16 AM   By signing my name below, I, Cynda AcresHailei Fulton, attest that this documentation has been prepared under the direction and in the presence of Houston SirenPeter Xariah Silvernail, MD. Electronically signed: Cynda AcresHailei Fulton, Scribe. 11/02/15 11:14 AM

## 2015-11-02 NOTE — Care Management Note (Signed)
Case Management Note  Patient Details  Name: Jamie Evans MRN: [id number] Date of Birth: 01/16/1971  Subjective/Objective:      Patient adm with diarrhea and opoid withdrawal. She lives at home with her husband and daughter. She is ind with ADL's. She has PCP, and medicaid, she has  transportation to appointments and no difficulty affording her medications. She plans to return home at DC.        Action/Plan: Anticipate DC home with self care. CSW working with patient regarding OP treatment for substance abuse. No CM needs.   Expected Discharge Date:  11/02/15               Expected Discharge Plan:  Home/Self Care  In-House Referral:  Clinical Social Work  Discharge planning Services  CM Consult  Post Acute Care Choice:  NA Choice offered to:  NA  DME Arranged:    DME Agency:     HH Arranged:    HH Agency:     Status of Service:  Completed, signed off  If discussed at [employer] Meetings, dates discussed:    Additional Comments:  Francena Zender, Chrystine OilerSharley Diane, RN 11/02/2015, 2:46 PM

## 2015-11-02 NOTE — Clinical Social Work Note (Signed)
Clinical Social Work Assessment  Patient Details  Name: Pryor MontesRobin L Bremer MRN: [id number] Date of Birth: 09/07/1970  Date of referral:  11/02/15               Reason for consult:  Facility Placement ([facility])                Permission sought to share information with:    Permission granted to share information::     Name::        Agency::     Relationship::     Contact Information:     Housing/Transportation Living arrangements for the past 2 months:  Single Family Home Source of Information:  Patient Patient Interpreter Needed:  None Criminal Activity/Legal Involvement Pertinent to Current Situation/Hospitalization:  No - Comment as needed Significant Relationships:  Adult Children, Dependent Children Lives with:  Adult Children Do you feel safe going back to the place where you live?  Yes Need for family participation in patient care:  Yes (Comment)  Care giving concerns:  None identified.    Social Worker assessment / plan:  CSW discussed referral being made for SA concerns. Client denied cocaine use. She stated that she does not know why she tested positive.  She admitted to injecting her pain pills. She stated that she realizes that she should take them by mouth and admitted to doing so to get high. Patient stated that she wanted to go to treatment for Suboxone. She stated that she has tried Methadone in the past and it was not effective. Patient indicated that she got up to 50 mg of Methodone but it did not work out.  CSW contacted Dr. Birdena Crandalloonque's office and was advised that patient would have to be referred by her PCP. CSW contacted patient's PCP, Dr. Bartholomew CrewsHasanaj's office, and requested a referral. Deanna at Dr. Bartholomew CrewsHasanaj's office advised that she would speak to the PCP and the referral should not be a problem.    Employment status:  Unemployed Health and safety inspectornsurance information:  [place] Recommend[place]:    Information / Referral to community resources:  Outpatient Substance Abuse  Treatment Options  Patient/Family's Response to care:  Patient is agreeable to go to the Suboxone treatment.   Patient/Family's Understanding of and Emotional Response to Diagnosis, Current Treatment, and Prognosis:  Patient understands her diagnosis, treatment and prognosis and realizes SA treatment is needed.   Emotional Assessment Appearance:  Appears stated age Attitude/Demeanor/Rapport:   (Cooperative) Affect (typically observed):  Accepting Orientation:  Oriented to Self, Oriented to Place, Oriented to  Time, Oriented to Situation Alcohol / Substance use:  Illicit Drugs Psych involvement (Current and /or in the community):  No (Comment)  Discharge Needs  Concerns to be addressed:  Substance Abuse Concerns Readmission within the last 30 days:  Yes Current discharge risk:  None Barriers to Discharge:  No Barriers Identified   Annice NeedySettle, Zaylia Riolo D, LCSW 11/02/2015, 11:35 AM

## 2015-11-03 DIAGNOSIS — F1120 Opioid dependence, uncomplicated: Secondary | ICD-10-CM

## 2015-11-03 DIAGNOSIS — F19230 Other psychoactive substance dependence with withdrawal, uncomplicated: Secondary | ICD-10-CM

## 2015-11-03 DIAGNOSIS — F1123 Opioid dependence with withdrawal: Secondary | ICD-10-CM

## 2015-11-03 MED ORDER — PROCHLORPERAZINE EDISYLATE 5 MG/ML IJ SOLN
5.0000 mg | INTRAMUSCULAR | Status: DC | PRN
  Administered 2015-11-03: 5 mg via INTRAVENOUS
  Filled 2015-11-03: qty 2

## 2015-11-03 MED ORDER — LORAZEPAM 2 MG/ML IJ SOLN
1.0000 mg | Freq: Once | INTRAMUSCULAR | Status: AC
  Administered 2015-11-03: 1 mg via INTRAVENOUS
  Filled 2015-11-03: qty 1

## 2015-11-03 MED ORDER — PROMETHAZINE HCL 25 MG/ML IJ SOLN
12.5000 mg | INTRAMUSCULAR | Status: DC | PRN
  Administered 2015-11-03: 25 mg via INTRAVENOUS
  Filled 2015-11-03 (×2): qty 1

## 2015-11-03 NOTE — Clinical Social Work Note (Signed)
CSW spoke with Deanna at Dr. Olena LeatherwoodHasanaj office. Deanna advised that she had left the note for the referral coordinator to make the referral for patient. She advised that upon coordinator's arrival she would request that she make the referral as patient is likely to discharge today.    Annice NeedySettle, Jeyden Coffelt D, [place]    [phone]

## 2015-11-03 NOTE — Discharge Summary (Addendum)
Physician Discharge Summary  Jamie Evans ZOX:[id number] DOB: 02/23/1970 DOA: 10/31/2015  [facility] (Inactive)  Admit date: 10/31/2015 Discharge date: 11/03/2015  Time spent: 45 minutes  Recommendations for Outpatient Follow-up:  -We'll be discharged home today. -Referral has been made to pain addiction specialist.   Discharge Diagnoses:  Principal Problem:   Withdrawal from opioids [facility](HCC) Active Problems:   Hypokalemia   Essential hypertension   Substance abuse   Chronic narcotic dependence (HCC)   IV drug abuse   Volume depletion   Nausea vomiting and diarrhea   Abdominal pain   Acute narcotic withdrawal without complication (HCC) Severe protein-caloric malnutrition  Discharge Condition: Stable and improved  Filed Weights   11/01/15 0500 11/02/15 0500 11/03/15 0419  Weight: 52.7 kg (116 lb 4.3 oz) 51.9 kg (114 lb 5.6 oz) 53.3 kg (117 lb 6.4 oz)    History of present illness:  As per Dr. Arlean HoppingSchertz on 10/1: Jamie MontesRobin L Evans is a 45 y.o. female with hx of substance abuse (injecting crush narcotic pills) who presented to ED with history of nausea/ vomiting starting today, also some diarrhea.  Began vomiting at 9 am today and continues every 30 min or so.  +associated chills and gen'd abd pain and gen'd weakness. In ED pt given po clonidine for ^BP, decadron IV, benadryl IV, toradol IM, Ativan IV, Reglan IV, Zofran IV, KCL IV, Compazine IV, phenergan IV and 2 L bolus of NS.  She is still retching. Asked to see for admission.    Pt was admitted here for AMS and PRES from 9/3- 9/17.  She says since she was dc'd she has been trying to get off of the IV narcotics and cut back from 3-4 doses per day to 1 per day (one dose is a 20-30 mg roxicodone crushed, melted and injected into one of the upper ext veins).  She was doing "fine" until today. She suspects these are withdrawal syptoms.  She does cocaine once in a while, but never a lot.    She is  separated.  Has Medicaid, takes her pills.    Denies any CP, SOB, dysuria, fevers. +chills, nausea, diffuse pain.   Hospital Course:   Polysubstance abuse with narcotic withdrawals -Is through withdrawals, she uses injected narcotics, her UDS was also positive for cocaine which she refuses. -Referral has been made to a pain addiction specialist. -We'll continue clonidine which should help with withdrawal symptoms. Patient is requesting a prescription for Ativan which I have declined.  Procedures:  None   Consultations:  None  Discharge Instructions  Discharge Instructions    Diet - low sodium heart healthy    Complete by:  As directed    Increase activity slowly    Complete by:  As directed        Medication List    STOP taking these medications   docusate sodium 100 MG capsule Commonly known as:  COLACE   hydrOXYzine 50 MG tablet Commonly known as:  ATARAX/VISTARIL   LORazepam 0.5 MG tablet Commonly known as:  ATIVAN   ondansetron 4 MG tablet Commonly known as:  ZOFRAN     TAKE these medications   benazepril 5 MG tablet Commonly known as:  LOTENSIN Take 5 mg by mouth daily.   calcium carbonate 500 MG chewable tablet Commonly known as:  TUMS - dosed in mg elemental calcium Chew 1 tablet (200 mg of elemental calcium total) by mouth 3 (three) times daily as needed for  indigestion or heartburn.   chlorthalidone 25 MG tablet Commonly known as:  HYGROTON Take 25 mg by mouth daily.   cloNIDine 0.1 MG tablet Commonly known as:  CATAPRES Take 1 tablet (0.1 mg total) by mouth daily.   feeding supplement Liqd Take 1 Container by mouth 3 (three) times daily between meals.   FLUoxetine 20 MG capsule Commonly known as:  PROZAC Take 20 mg by mouth daily.   folic acid 1 MG tablet Commonly known as:  FOLVITE Take 1 tablet (1 mg total) by mouth daily.   metoprolol 50 MG tablet Commonly known as:  LOPRESSOR Take 50 mg by mouth 2 (two) times daily.     nicotine 14 mg/24hr patch Commonly known as:  NICODERM CQ - dosed in mg/24 hours Place 1 patch (14 mg total) onto the skin daily.   ranitidine 300 MG tablet Commonly known as:  ZANTAC Take 300 mg by mouth 2 (two) times daily.   thiamine 100 MG tablet Take 1 tablet (100 mg total) by mouth daily.      Allergies  Allergen Reactions  . Hydrocodone Nausea And Vomiting  . Penicillins Nausea Only    Has patient had a PCN reaction causing immediate rash, facial/tongue/throat swelling, SOB or lightheadedness with hypotension: No Has patient had a PCN reaction causing severe rash involving mucus membranes or skin necrosis: No Has patient had a PCN reaction that required hospitalization No Has patient had a PCN reaction occurring within the last 10 years: No If all of the above answers are "NO", then may proceed with Cephalosporin use.    Follow-up Information    [facility]. Schedule an appointment as soon as possible for a visit in 2 week(s).   Specialty:  Internal Medicine Contact information: [address] [place] [address] [phone]            The results of significant diagnostics from this hospitalization (including imaging, microbiology, ancillary and laboratory) are listed below for reference.    Significant Diagnostic Studies: Dg Chest 1 View  Result Date: 10/05/2015 CLINICAL DATA:  Leukocytosis and altered mental state EXAM: CHEST 1 VIEW COMPARISON:  None. FINDINGS: The heart size and mediastinal contours are within normal limits. Both lungs are clear. The visualized skeletal structures are unremarkable. IMPRESSION: No active disease. Electronically Signed   By: Marlan Palau M.D.   On: 10/05/2015 18:02   Ct Head Wo Contrast  Result Date: 10/05/2015 CLINICAL DATA:  Altered mental status, possible seizure. EXAM: CT HEAD WITHOUT CONTRAST TECHNIQUE: Contiguous axial images were obtained from the base of the skull through the vertex without  intravenous contrast. COMPARISON:  None. FINDINGS: Brain: No acute intracranial abnormality. Specifically, no hemorrhage, hydrocephalus, mass lesion, acute infarction, or significant intracranial injury. Vascular: No hyperdense vessel or unexpected calcification. Skull: No acute calvarial abnormality. Sinuses/Orbits: Visualized paranasal sinuses and mastoids clear. Orbital soft tissues unremarkable. Other: None IMPRESSION: No acute intracranial abnormality. Electronically Signed   By: Charlett Nose M.D.   On: 10/05/2015 15:24   Mr Brain Wo Contrast  Result Date: 10/06/2015 CLINICAL DATA:  Altered mental status beginning 2 days ago with possible seizure. EXAM: MRI HEAD WITHOUT CONTRAST TECHNIQUE: Multiplanar, multiecho pulse sequences of the brain and surrounding structures were obtained without intravenous contrast. COMPARISON:  Head CT 10/05/2015 FINDINGS: Brain: Diffusion imaging does not show any acute infarction. The brainstem and cerebellum are normal. There is abnormal cortical and subcortical edema in the occipital and posterior parietal regions likely secondary to posterior  reversible encephalopathy. No sign of hemorrhage. The remainder the brain is normal. No mass effect. No hydrocephalus. No extra-axial collection. Vascular: Major vessels at the base of the brain show flow. Skull and upper cervical spine: Negative Sinuses/Orbits: Clear/normal Other: None significant IMPRESSION: Abnormal cortical and subcortical edema of the cerebral hemispheres posteriorly consistent with posterior reversible encephalopathy. No evidence of hemorrhage or mass effect. Electronically Signed   By: Paulina Fusi M.D.   On: 10/06/2015 10:36   US Abdomen Complete  Result Date: 10/07/2015 CLINICAL DATA:  Hyperbilirubinemia.  History of cholecystectomy. EXAM: ABDOMEN ULTRASOUND COMPLETE COMPARISON:  Abdominal ultrasound of October 05, 2015 FINDINGS: Gallbladder: The gallbladder is surgically absent. Common bile duct: Diameter:  4 mm Liver: The hepatic echotexture is normal. There may be mild persistent intrahepatic ductal dilation centrally. IVC: No abnormality visualized. Pancreas: Visualized portion unremarkable. Spleen: Size and appearance within normal limits. Right Kidney: Length: 12.2 cm. Echogenicity within normal limits. No mass or hydronephrosis visualized. Left Kidney: Length: 12.4 cm. Echogenicity within normal limits. No mass or hydronephrosis visualized. Abdominal aorta: There is no abdominal aortic aneurysm. Other findings: Fluid is present in the left spleen no renal fossa. IMPRESSION: 1. The gallbladder is surgically absent. Mild intrahepatic ductal dilation observed. The common bile duct is normal at 4 mm. 2. No pancreatic mass nor other acute abnormality observed within the abdomen. Electronically Signed   By: David  [place] M.D.   On: 10/07/2015 13:02   US Abdomen Limited Ruq  Result Date: 10/05/2015 CLINICAL DATA:  Drug withdrawal.  Right abdominal pain for 2 days. EXAM: US ABDOMEN LIMITED - RIGHT UPPER QUADRANT COMPARISON:  None. FINDINGS: Gallbladder: Surgically absent, removed on 06/22/2008. Common bile duct: Diameter: 3 mm Liver: Moderate intrahepatic biliary dilatation, I do not see a focal liver lesion. IMPRESSION: 1. Abnormal intrahepatic biliary dilatation without extrahepatic biliary dilatation. I do not see a definite lesion to be causing obstruction, but in combination of the patient's elevated liver function tests and hyperbilirubinemia, the possibility of an otherwise occult obstructive process must be considered. Typically I would recommend MRI of the liver with and without contrast using MRCP protocol to assess for a [place] tumor or other cause for potential obstruction. However, the patient had extreme difficulty holding still during the ultrasound, and had difficulty cooperating, and I worry that we would not be able to obtain a diagnostic MRI due to the required breath-holding and stationary  positioning. If upon assessment the patient seems unlikely to be able to hold still at MRI, consider dedicated hepatic protocol CT with and without contrast for further workup. Electronically Signed   By: Gaylyn Rong M.D.   On: 10/05/2015 18:26    Microbiology: No results found for this or any previous visit (from the past 240 hour(s)).   Labs: Basic Metabolic Panel:  Recent Labs Lab 10/31/15 1446 11/01/15 0650 11/02/15 0556  NA 136 135 141  K 2.6* 3.1* 3.6  CL 102 104 110  CO2 25 23 25   GLUCOSE 137* 125* 118*  BUN 19 17 15   CREATININE 0.99 0.99 0.97  CALCIUM 9.0 8.8* 8.7*   Liver Function Tests:  Recent Labs Lab 10/31/15 1446 11/02/15 0556  AST 20 20  ALT 33 21  ALKPHOS 126 85  BILITOT 1.2 0.6  PROT 7.6 6.1*  ALBUMIN 3.8 3.0*    Recent Labs Lab 10/31/15 1446  LIPASE 22   No results for input(s): AMMONIA in the last 168 hours. CBC:  Recent Labs Lab 10/31/15 1446  WBC 10.3  NEUTROABS 7.3  HGB 15.9*  HCT 43.0  MCV 84.3  PLT 307   Cardiac Enzymes:  Recent Labs Lab 10/31/15 1446  TROPONINI <0.03   BNP: BNP (last 3 results) No results for input(s): BNP in the last 8760 hours.  ProBNP (last 3 results) No results for input(s): PROBNP in the last 8760 hours.  CBG: No results for input(s): GLUCAP in the last 168 hours.     SignedChaya Jan  [facility] Pager: [phone] 11/03/2015, 3:07 PM

## 2015-11-03 NOTE — Progress Notes (Signed)
[id number] Patient restless, unable to stay asleep, tearful and requesting more nausea and anti-anxiety medication. MD notified.

## 2016-05-02 LAB — BLOOD GAS, ARTERIAL
ACID-BASE EXCESS: 1.4 mmol/L (ref 0.0–2.0)
BICARBONATE: 26.4 mmol/L (ref 20.0–28.0)
Drawn by: 270161
FIO2: 21
O2 SAT: 97.1 %
PCO2 ART: 31.7 mmHg — AB (ref 32.0–48.0)
PH ART: 7.498 — AB (ref 7.350–7.450)
PO2 ART: 90.3 mmHg (ref 83.0–108.0)
Patient temperature: 37.6
TCO2: 21.5 mmol/L (ref 0–100)

## 2017-07-20 IMAGING — MR MR HEAD W/O CM
7 series · 48 of 48 positions shown · non-contrast
Comparison: Head CT 10/05/2015

CLINICAL DATA: Altered mental status beginning 2 days ago with
possible seizure.

EXAM:
MRI HEAD WITHOUT CONTRAST
TECHNIQUE: Multiplanar, multiecho pulse sequences of the brain and surrounding
structures were obtained without intravenous contrast.

[Series 3: [id number] · sagittal · 5.0mm · 0.41mm/px · 4 of 20 slices shown]
[im 1/20]
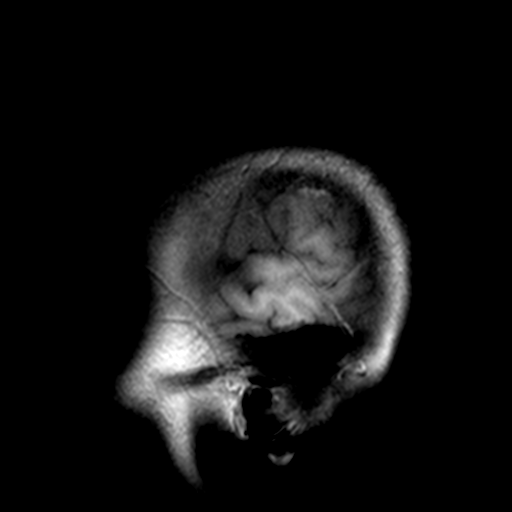
[im 7/20]
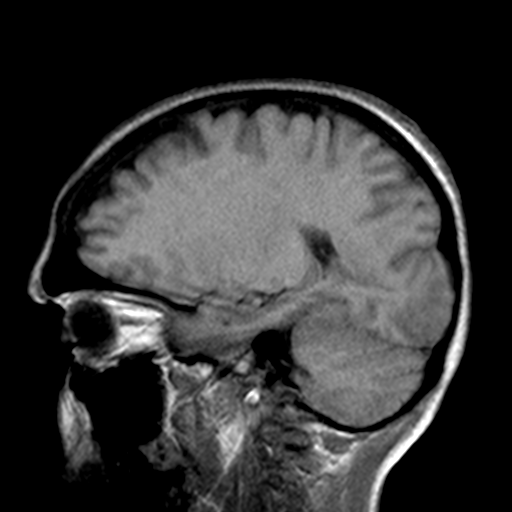
[im 13/20]
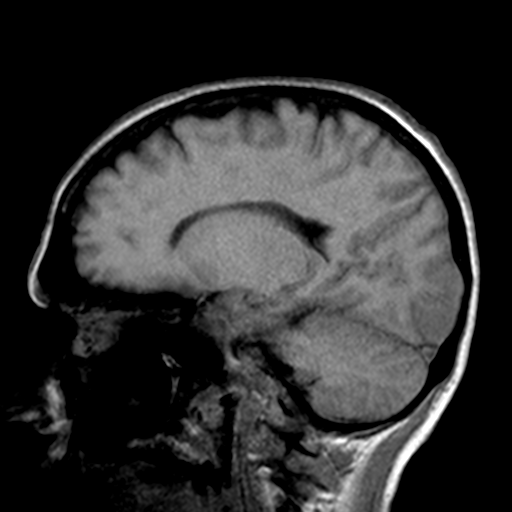
[im 20/20]
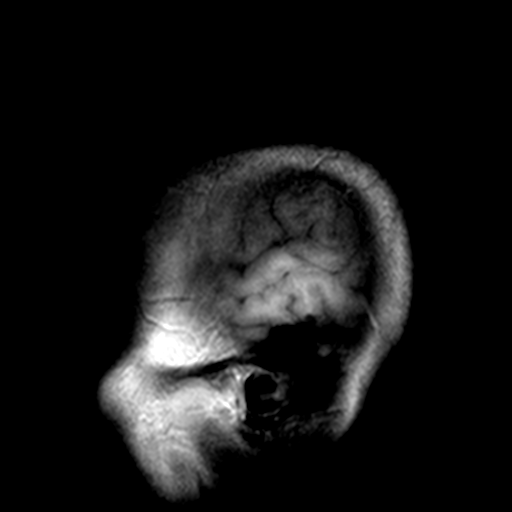

[Series 6: T2 · axial · 5.0mm · 0.75mm/px · z∈[-112,+31]mm · 4 of 23 slices shown]
[im 1/23]
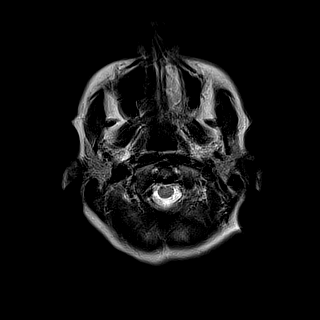
[im 8/23]
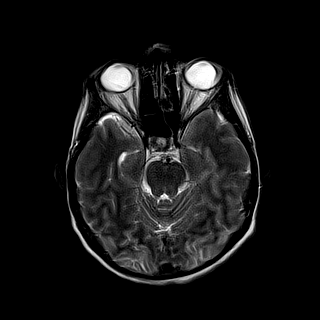
[im 15/23]
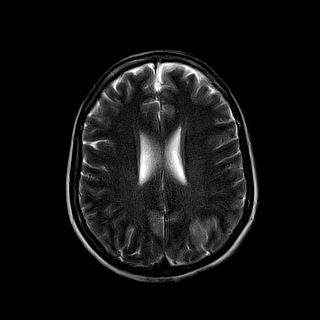
[im 23/23]
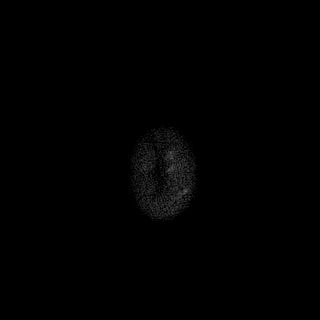

[Series 7: FLAIR · axial · 5.0mm · 0.94mm/px · z∈[-112,+31]mm · 4 of 23 slices shown]
[im 1/23]
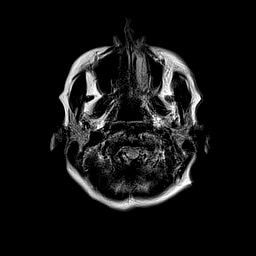
[im 8/23]
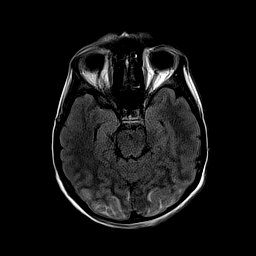
[im 15/23]
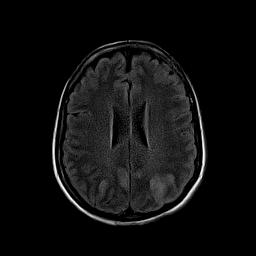
[im 23/23]
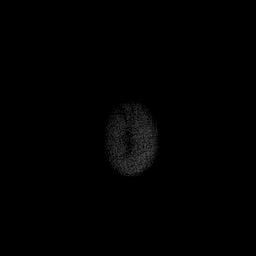

[Series 100: <mpr thick range> · axial · 3.0mm · 0.82mm/px · z∈[-112,+28]mm · 8 of 47 slices shown]
[im 1/47]
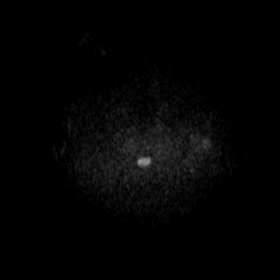
[im 7/47]
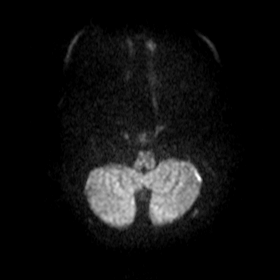
[im 14/47]
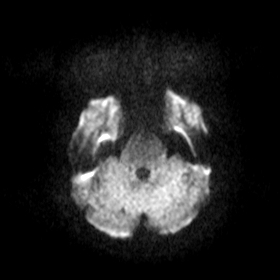
[im 20/47]
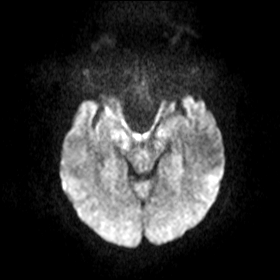
[im 27/47]
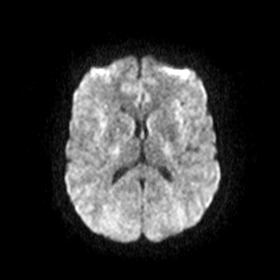
[im 33/47]
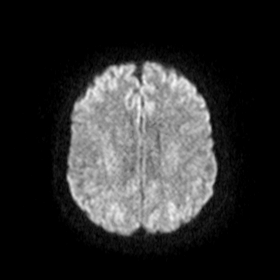
[im 40/47]
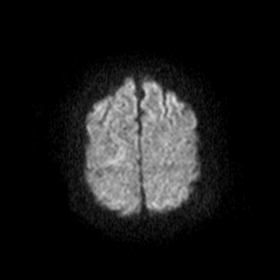
[im 47/47]
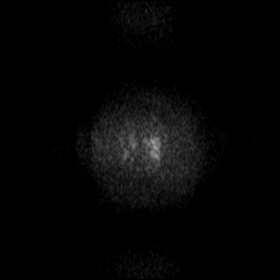

[Series 101: <mpr thick range(1)> · coronal · 3.0mm · 0.82mm/px · 10 of 53 slices shown]
[im 1/53]
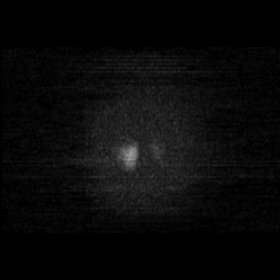
[im 6/53]
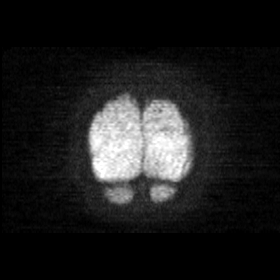
[im 12/53]
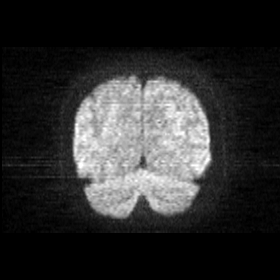
[im 18/53]
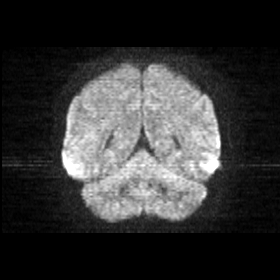
[im 24/53]
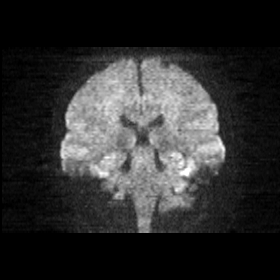
[im 29/53]
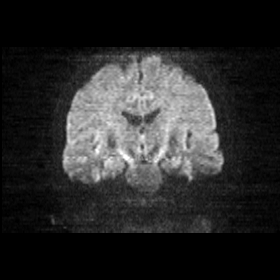
[im 35/53]
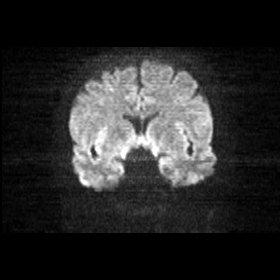
[im 41/53]
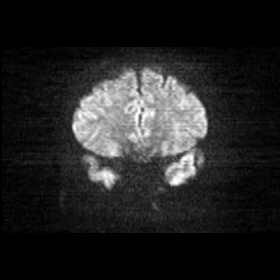
[im 47/53]
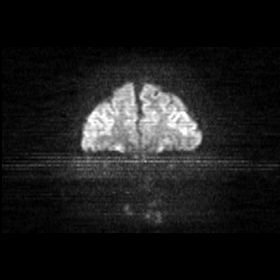
[im 53/53]
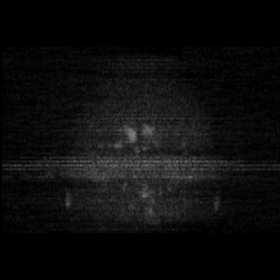

[Series 102: <mpr thick range(2)> · axial · 3.0mm · 0.82mm/px · z∈[-112,+28]mm · 8 of 47 slices shown]
[im 1/47]
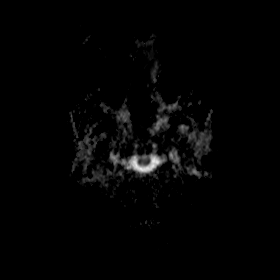
[im 7/47]
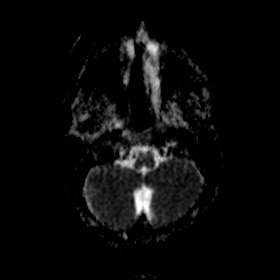
[im 14/47]
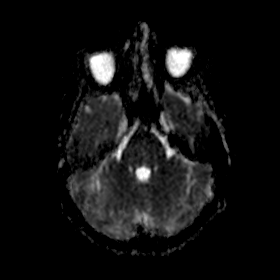
[im 20/47]
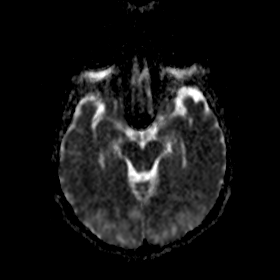
[im 27/47]
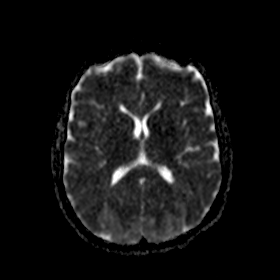
[im 33/47]
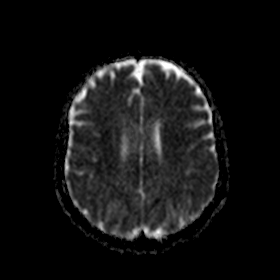
[im 40/47]
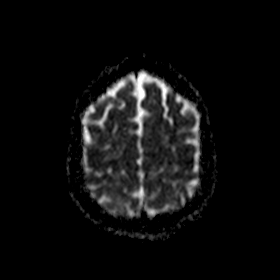
[im 47/47]
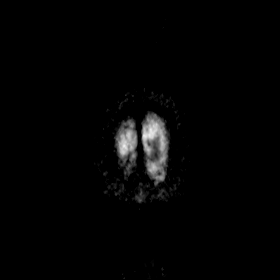

[Series 103: <mpr thick range(3)> · coronal · 3.0mm · 0.82mm/px · 10 of 53 slices shown]
[im 1/53]
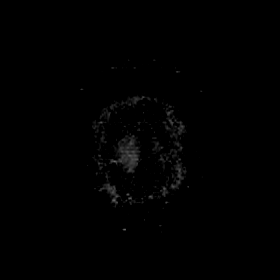
[im 6/53]
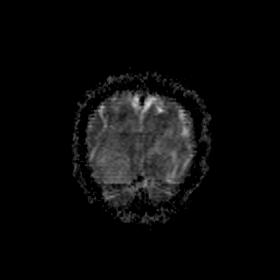
[im 12/53]
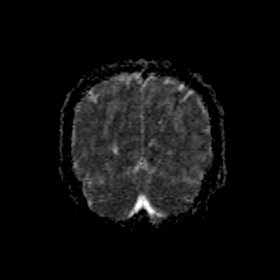
[im 18/53]
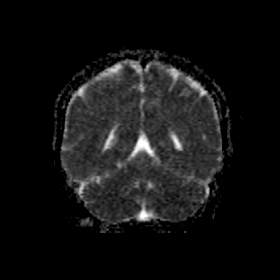
[im 24/53]
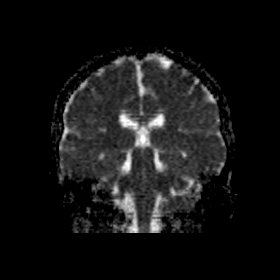
[im 29/53]
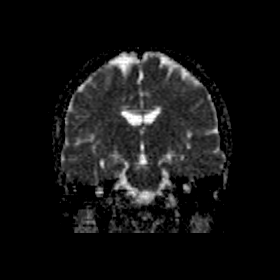
[im 35/53]
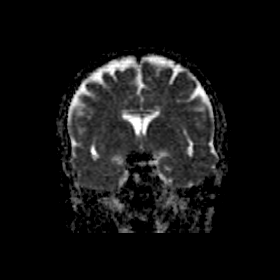
[im 41/53]
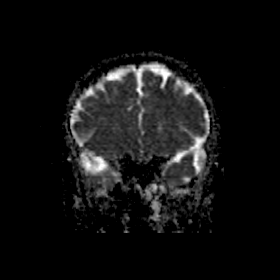
[im 47/53]
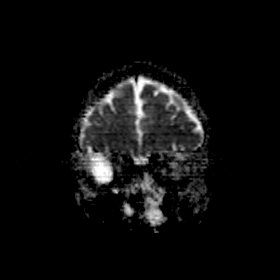
[im 53/53]
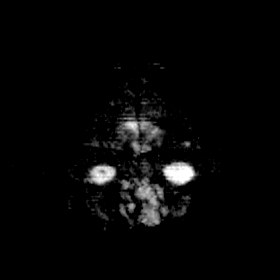

[48 of 48 positions shown; findings below may reference images not displayed]

FINDINGS: Brain: Diffusion imaging does not show any acute infarction. The
brainstem and cerebellum are normal. There is abnormal cortical and
subcortical edema in the occipital and posterior parietal regions
likely secondary to posterior reversible encephalopathy. No sign of
hemorrhage. The remainder the brain is normal. No mass effect. No
hydrocephalus. No extra-axial collection.

Vascular: Major vessels at the base of the brain show flow.

Skull and upper cervical spine: Negative

Sinuses/Orbits: Clear/normal

Other: None significant
IMPRESSION: Abnormal cortical and subcortical edema of the cerebral hemispheres
posteriorly consistent with posterior reversible encephalopathy. No
evidence of hemorrhage or mass effect.

## 2022-06-11 ENCOUNTER — Encounter: Payer: Self-pay | Admitting: Emergency Medicine

## 2022-06-11 ENCOUNTER — Ambulatory Visit
Admission: EM | Admit: 2022-06-11 | Discharge: 2022-06-11 | Disposition: A | Discharge status: Home / Self Care | Attending: Nurse Practitioner | Admitting: Nurse Practitioner

## 2022-06-11 DIAGNOSIS — M79622 Pain in left upper arm: Secondary | ICD-10-CM

## 2022-06-11 DIAGNOSIS — R0789 Other chest pain: Secondary | ICD-10-CM

## 2022-06-11 HISTORY — DX: Essential (primary) hypertension: I10

## 2022-06-11 MED ORDER — CYCLOBENZAPRINE HCL 5 MG PO TABS: 5.0000 mg | ORAL_TABLET | Freq: Three times a day (TID) | ORAL | 0 refills | Status: DC | PRN
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
## 2022-06-11 NOTE — Discharge Instructions (Addendum)
Take medication as prescribed. May continue over-the-counter Aleve as needed for pain or discomfort.  If you are having breakthrough pain, recommend Tylenol arthritis strength 650 mg tablets every 8 hours as needed. Recommend the use of ice or heat as needed.  Apply ice for pain or swelling, heat for spasm or stiffness.  Apply for 20 minutes, remove for 1 hour, then repeat as needed. Gentle stretching and range of motion exercises of the left arm to help decrease recovery time. As discussed, if symptoms do not improve over the next several days, please follow-up with your primary care physician for further evaluation. Follow-up as needed.

## 2022-07-24 ENCOUNTER — Ambulatory Visit (INDEPENDENT_AMBULATORY_CARE_PROVIDER_SITE_OTHER): Payer: Self-pay

## 2022-07-24 ENCOUNTER — Ambulatory Visit
Admission: EM | Admit: 2022-07-24 | Discharge: 2022-07-24 | Disposition: A | Discharge status: Home / Self Care | Payer: Self-pay | Attending: Nurse Practitioner | Admitting: Nurse Practitioner

## 2022-07-24 DIAGNOSIS — S9032XA Contusion of left foot, initial encounter: Secondary | ICD-10-CM

## 2022-07-24 MED ORDER — IBUPROFEN 400 MG PO TABS: 400.0000 mg | ORAL_TABLET | Freq: Three times a day (TID) | ORAL | 0 refills | Status: DC | PRN

## 2022-07-24 NOTE — ED Triage Notes (Signed)
Pt dropped large steel cup on her left foot yesterday. Now having pain and slight swelling to her foot.

## 2022-07-24 NOTE — Discharge Instructions (Addendum)
The x-ray today does not show any broken bones.  Keep the Ace wrap on your foot when you are up moving around.  When you are sitting down, keep your foot elevated and apply ice 15 minutes on, 45 minutes off every hour while awake.  Recommend Tylenol 500 to 1000 mg every 8 hours alternating with ibuprofen 400 mg every 8 hours as needed for pain.  Seek care for persistent/worsening symptoms despite treatment.
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
## 2022-07-28 ENCOUNTER — Ambulatory Visit
Admission: EM | Admit: 2022-07-28 | Discharge: 2022-07-28 | Disposition: A | Discharge status: Home / Self Care | Payer: Medicaid Other

## 2022-07-28 DIAGNOSIS — S9032XA Contusion of left foot, initial encounter: Secondary | ICD-10-CM
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
## 2022-07-28 NOTE — ED Triage Notes (Signed)
Pt reports she needs a note for work for swollen foot that she was seen for on 6/24

## 2022-11-15 ENCOUNTER — Encounter: Payer: Self-pay | Admitting: Adult Health

## 2022-11-15 ENCOUNTER — Ambulatory Visit (INDEPENDENT_AMBULATORY_CARE_PROVIDER_SITE_OTHER): Payer: Medicaid Other | Admitting: Adult Health

## 2022-11-15 ENCOUNTER — Other Ambulatory Visit (HOSPITAL_COMMUNITY)
Admission: RE | Admit: 2022-11-15 | Discharge: 2022-11-15 | Disposition: A | Discharge status: Home / Self Care | Payer: Medicaid Other | Source: Ambulatory Visit | Attending: Adult Health | Admitting: Adult Health

## 2022-11-15 VITALS — BP 154/92 | HR 77 | Ht 64.0 in | Wt 152.5 lb

## 2022-11-15 DIAGNOSIS — N9089 Other specified noninflammatory disorders of vulva and perineum: Secondary | ICD-10-CM

## 2022-11-15 DIAGNOSIS — F419 Anxiety disorder, unspecified: Secondary | ICD-10-CM | POA: Insufficient documentation

## 2022-11-15 DIAGNOSIS — Z1231 Encounter for screening mammogram for malignant neoplasm of breast: Secondary | ICD-10-CM

## 2022-11-15 DIAGNOSIS — Z0000 Encounter for general adult medical examination without abnormal findings: Secondary | ICD-10-CM

## 2022-11-15 DIAGNOSIS — N952 Postmenopausal atrophic vaginitis: Secondary | ICD-10-CM

## 2022-11-15 DIAGNOSIS — N941 Unspecified dyspareunia: Secondary | ICD-10-CM

## 2022-11-15 DIAGNOSIS — Z01419 Encounter for gynecological examination (general) (routine) without abnormal findings: Secondary | ICD-10-CM | POA: Insufficient documentation

## 2022-11-15 DIAGNOSIS — Z1211 Encounter for screening for malignant neoplasm of colon: Secondary | ICD-10-CM | POA: Diagnosis not present

## 2022-11-15 DIAGNOSIS — F32A Depression, unspecified: Secondary | ICD-10-CM

## 2022-11-15 DIAGNOSIS — Z1329 Encounter for screening for other suspected endocrine disorder: Secondary | ICD-10-CM

## 2022-11-15 DIAGNOSIS — Z13220 Encounter for screening for lipoid disorders: Secondary | ICD-10-CM

## 2022-11-15 DIAGNOSIS — Z1212 Encounter for screening for malignant neoplasm of rectum: Secondary | ICD-10-CM

## 2022-11-15 DIAGNOSIS — I10 Essential (primary) hypertension: Secondary | ICD-10-CM

## 2022-11-15 LAB — HEMOCCULT GUIAC POC 1CARD (OFFICE): Fecal Occult Blood, POC: NEGATIVE

## 2022-11-15 MED ORDER — HYDROXYZINE PAMOATE 25 MG PO CAPS: 25.0000 mg | ORAL_CAPSULE | Freq: Three times a day (TID) | ORAL | 3 refills | Status: DC | PRN

## 2022-11-15 MED ORDER — LOSARTAN POTASSIUM 25 MG PO TABS: 25.0000 mg | ORAL_TABLET | Freq: Every day | ORAL | 6 refills | Status: DC

## 2022-11-15 NOTE — Progress Notes (Signed)
Patient ID: Jamie Evans, female   DOB: 1970/10/01, 52 y.o.   MRN: [id number] History of Present Illness: Jamie Evans is a 52 year old white female, separated, PM in for a well woman gyn exam and pap. Last pap was in 2015. She is having vaginal irritation esp when sweating  and walking for about 6 months  and pain with sex. She says 52 year old daughter driving her crazy.  PCP is [facility], Dr Olena Leatherwood Current Medications, Allergies, Past Medical History, Past Surgical History, Family History and Social History were reviewed in [employer] [facility] electronic medical record.     Review of Systems: Patient denies any headaches, hearing loss, fatigue, blurred vision, shortness of breath, chest pain, abdominal pain, problems with bowel movements, or urination. No joint pain or mood swings.  Denies any vaginal bleeding See HPI for positives    Physical Exam:BP (!) 154/92 (BP Location: Right Arm, Patient Position: Sitting, Cuff Size: Normal)   Pulse 77   Ht 5\' 4"  (1.626 m)   Wt 152 lb 8 oz (69.2 kg)   LMP 10/11/2014   BMI 52.18 kg/m   General:  Well developed, well nourished, no acute distress Skin:  Warm and dry Neck:  Midline trachea, normal thyroid, good ROM, no lymphadenopathy Lungs; Clear to auscultation bilaterally Breast:  No dominant palpable mass, retraction, or nipple discharge Cardiovascular: Regular rate and rhythm Abdomen:  Soft, non tender, no hepatosplenomegaly Pelvic:  External genitalia: left labia has irregular texture on the outer side and has small ulcer on edge of labia and the inner labia dark raised area and it is tender. The vagina is pale and atrophic. Urethra has no lesions or masses. The cervix is smooth, pap with HR HPV genotyping performed.  Uterus is felt to be normal size, shape, and contour.  No adnexal masses or tenderness noted.Bladder is non tender, no masses felt. Rectal: Good sphincter tone, no polyps, or hemorrhoids felt.  Hemoccult negative. Extremities/musculoskeletal:   No swelling or varicosities noted, no clubbing or cyanosis Psych:  No mood changes, alert and cooperative,seems happy AA is 52 Fall risk is low    11/15/2022    2:40 PM  Depression screen PHQ 2/9  Decreased Interest 1  Down, Depressed, Hopeless 1  PHQ - 2 Score 2  Altered sleeping 2  Tired, decreased energy 1  Change in appetite 1  Feeling bad or failure about yourself  1  Trouble concentrating 1  Moving slowly or fidgety/restless 1  Suicidal thoughts 0  PHQ-9 Score 9       11/15/2022    2:40 PM  GAD 7 : Generalized Anxiety Score  Nervous, Anxious, on Edge 1  Control/stop worrying 1  Worry too much - different things 1  Trouble relaxing 1  Restless 1  Easily annoyed or irritable 1  Afraid - awful might happen 0  Total GAD 7 Score 6      Upstream - 52/16/24 1435       Pregnancy Intention Screening   Does the patient want to become pregnant in the next year? No    Does the patient's partner want to become pregnant in the next year? No    Would the patient like to discuss contraceptive options today? No      Contraception Wrap Up   Current Method Female Sterilization   PM   End Method Female Sterilization   PM   Contraception Counseling Provided No  Examination chaperoned by Malachy Mood LPN  Impression and plan: 1. Routine general medical examination at a health care facility Pap sent Pap in 3 years if normal Physical in 1 year Will check labs today - Cytology - PAP( [facility]) - CBC - Comprehensive metabolic panel  2. Encounter for gynecological examination with Papanicolaou smear of cervix Pap sent   3. Encounter for screening fecal occult blood testing Hemoccult was negative  - POCT occult blood stool  4. Screening for colorectal cancer - Cologuard  5. Screening mammogram for breast cancer Mammogram scheduled for her 11/20/22 at 3:30 pm at [facility]  - MM 3D SCREENING MAMMOGRAM BILATERAL BREAST; Future  6. Screening  cholesterol level - Lipid panel  7. Vulval lesion left labia has irregular texture on the outer side and has small ulcer on edge of labia and the inner labia dark raised area and it is tender Return 52/24/24 at 10:30 am for vulva biopsy with Dr Charlotta Newton   8. Vaginal atrophy Will address after get vulva biopsy   9. Dyspareunia in female  66. Screening for thyroid disorder  - TSH  11. Essential hypertension Will rx cozaar 25 mg 1 daily Will recheck in 3 months   Meds ordered this encounter  Medications   losartan (COZAAR) 25 MG tablet    Sig: Take 1 tablet (25 mg total) by mouth daily.    Dispense:  30 tablet    Refill:  6    Order Specific Question:   Supervising Provider    Answer:   Duane Lope H [[id number]]   hydrOXYzine (VISTARIL) 25 MG capsule    Sig: Take 1 capsule (25 mg total) by mouth every 8 (eight) hours as needed.    Dispense:  30 capsule    Refill:  3    Order Specific Question:   Supervising Provider    Answer:   Despina Hidden, LUTHER H [[id number]]     12. Anxiety and depression She declines SSRI, she asked for xanax but will not rx will try vistaril  Will rx vistaril 25 mg 1 every 8 hours prn  Will follow up in 3 months

## 2022-11-16 ENCOUNTER — Encounter: Payer: Self-pay | Admitting: Adult Health

## 2022-11-16 DIAGNOSIS — R748 Abnormal levels of other serum enzymes: Secondary | ICD-10-CM | POA: Insufficient documentation

## 2022-11-16 LAB — COMPREHENSIVE METABOLIC PANEL
ALT: 64 [IU]/L — ABNORMAL HIGH (ref 0–32)
AST: 59 [IU]/L — ABNORMAL HIGH (ref 0–40)
Albumin: 3.5 g/dL — ABNORMAL LOW (ref 3.8–4.9)
Alkaline Phosphatase: 168 [IU]/L — ABNORMAL HIGH (ref 44–121)
BUN/Creatinine Ratio: 7 — ABNORMAL LOW (ref 9–23)
BUN: 6 mg/dL (ref 6–24)
Bilirubin Total: 0.3 mg/dL (ref 0.0–1.2)
CO2: 31 mmol/L — ABNORMAL HIGH (ref 20–29)
Calcium: 8.8 mg/dL (ref 8.7–10.2)
Chloride: 99 mmol/L (ref 96–106)
Creatinine, Ser: 0.81 mg/dL (ref 0.57–1.00)
Globulin, Total: 2.8 g/dL (ref 1.5–4.5)
Glucose: 103 mg/dL — ABNORMAL HIGH (ref 70–99)
Potassium: 3.6 mmol/L (ref 3.5–5.2)
Sodium: 141 mmol/L (ref 134–144)
Total Protein: 6.3 g/dL (ref 6.0–8.5)
eGFR: 87 mL/min/{1.73_m2} (ref >59)

## 2022-11-16 LAB — CBC
Hematocrit: 43.9 % (ref 34.0–46.6)
Hemoglobin: 14.8 g/dL (ref 11.1–15.9)
MCH: 29.8 pg (ref 26.6–33.0)
MCHC: 33.7 g/dL (ref 31.5–35.7)
MCV: 89 fL (ref 79–97)
Platelets: 174 10*3/uL (ref 150–450)
RBC: 4.96 x10E6/uL (ref 3.77–5.28)
RDW: 12.7 % (ref 11.7–15.4)
WBC: 4.7 10*3/uL (ref 3.4–10.8)

## 2022-11-16 LAB — LIPID PANEL
Chol/HDL Ratio: 3 {ratio} (ref 0.0–4.4)
Cholesterol, Total: 86 mg/dL — ABNORMAL LOW (ref 100–199)
HDL: 29 mg/dL — ABNORMAL LOW (ref >39)
LDL Chol Calc (NIH): 42 mg/dL (ref 0–99)
Triglycerides: 66 mg/dL (ref 0–149)
VLDL Cholesterol Cal: 15 mg/dL (ref 5–40)

## 2022-11-16 LAB — TSH: TSH: 1.75 u[IU]/mL (ref 0.450–4.500)

## 2022-11-17 ENCOUNTER — Telehealth: Payer: Self-pay | Admitting: *Deleted

## 2022-11-17 NOTE — Telephone Encounter (Signed)
-----   Message from Cyril Mourning sent at 11/16/2022 12:07 PM EDT ----- Call pt about labs [facility]

## 2022-11-17 NOTE — Telephone Encounter (Signed)
Pt aware of lab results. Will recheck liver enzymes in 6 weeks. Pt voiced understanding. JSY

## 2022-11-20 ENCOUNTER — Ambulatory Visit (HOSPITAL_COMMUNITY)
Admission: RE | Admit: 2022-11-20 | Discharge: 2022-11-20 | Disposition: A | Discharge status: Home / Self Care | Payer: Medicaid Other | Source: Ambulatory Visit | Attending: Adult Health | Admitting: Adult Health

## 2022-11-20 DIAGNOSIS — Z1231 Encounter for screening mammogram for malignant neoplasm of breast: Secondary | ICD-10-CM | POA: Insufficient documentation

## 2022-11-21 ENCOUNTER — Telehealth: Payer: Self-pay | Admitting: *Deleted

## 2022-11-21 ENCOUNTER — Other Ambulatory Visit: Payer: Self-pay | Admitting: Adult Health

## 2022-11-21 ENCOUNTER — Encounter: Payer: Self-pay | Admitting: Adult Health

## 2022-11-21 DIAGNOSIS — A599 Trichomoniasis, unspecified: Secondary | ICD-10-CM | POA: Insufficient documentation

## 2022-11-21 LAB — CYTOLOGY - PAP
Comment: NEGATIVE
Diagnosis: NEGATIVE
High risk HPV: NEGATIVE

## 2022-11-21 MED ORDER — METRONIDAZOLE 500 MG PO TABS: 500.0000 mg | ORAL_TABLET | Freq: Two times a day (BID) | ORAL | 0 refills | Status: DC

## 2022-11-21 NOTE — Telephone Encounter (Signed)
-----   Message from Cyril Mourning sent at 11/21/2022  8:53 AM EDT ----- Will you let her know about pap and + trich. THX

## 2022-11-21 NOTE — Telephone Encounter (Signed)
Pt aware pap was negative for HPV and malignancy but + for trich. Med has been sent to pharmacy. No sex or alcohol while taking med. Partner needs to be treated. He can go to his MD, health dept or [facility] can treat. She will let us know if she wants [facility] to treat him. Needs POC in 2 weeks. Next pap due in 3 years. Pt voiced understanding. Call transferred to [facility] for appt. JSY

## 2022-11-22 ENCOUNTER — Telehealth: Payer: Self-pay | Admitting: *Deleted

## 2022-11-22 NOTE — Telephone Encounter (Signed)
-----   Message from [place] sent at 11/22/2022  1:03 PM EDT ----- Let her know mammogram was negative, repeat in 1 year THX

## 2022-11-22 NOTE — Telephone Encounter (Signed)
Pt aware mammogram was negative and to repeat in 1 year. Pt voiced understanding. [place]

## 2022-11-23 ENCOUNTER — Ambulatory Visit (INDEPENDENT_AMBULATORY_CARE_PROVIDER_SITE_OTHER): Payer: Medicaid Other | Admitting: Obstetrics & Gynecology

## 2022-11-23 ENCOUNTER — Encounter: Payer: Self-pay | Admitting: Obstetrics & Gynecology

## 2022-11-23 ENCOUNTER — Other Ambulatory Visit (HOSPITAL_COMMUNITY)
Admission: RE | Admit: 2022-11-23 | Discharge: 2022-11-23 | Disposition: A | Discharge status: Home / Self Care | Payer: Medicaid Other | Source: Ambulatory Visit | Attending: Obstetrics & Gynecology | Admitting: Obstetrics & Gynecology

## 2022-11-23 VITALS — BP 127/87 | HR 70 | Ht 64.0 in | Wt 156.0 lb

## 2022-11-23 DIAGNOSIS — Z780 Asymptomatic menopausal state: Secondary | ICD-10-CM

## 2022-11-23 DIAGNOSIS — N9089 Other specified noninflammatory disorders of vulva and perineum: Secondary | ICD-10-CM | POA: Diagnosis present
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
## 2022-11-27 ENCOUNTER — Other Ambulatory Visit: Payer: Self-pay | Admitting: Obstetrics & Gynecology

## 2022-11-27 ENCOUNTER — Telehealth: Payer: Self-pay | Admitting: Obstetrics & Gynecology

## 2022-11-27 DIAGNOSIS — D071 Carcinoma in situ of vulva: Secondary | ICD-10-CM

## 2022-11-27 LAB — SURGICAL PATHOLOGY

## 2022-11-27 NOTE — Progress Notes (Signed)
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

## 2022-11-27 NOTE — Telephone Encounter (Signed)
Called pt with results, referral made Briefly discussed vulvar dysplasia and treatment Encouraged pt to follow up in [place]  Myna Hidalgo, DO Attending Obstetrician & Gynecologist, [facility] [employer], [facility]

## 2022-11-28 ENCOUNTER — Telehealth: Payer: Self-pay

## 2022-11-28 NOTE — Telephone Encounter (Signed)
Spoke with Jamie Evans regarding her referral to GYN oncology. She has an appointment scheduled with Dr.Tucker on 12/14/22 at 10:30. Patient agrees to date and time. She has been provided with office address and location. She is also aware of our mask and visitor policy. Patient verbalized understanding and will call with any questions.

## 2022-12-05 ENCOUNTER — Other Ambulatory Visit (HOSPITAL_COMMUNITY)
Admission: RE | Admit: 2022-12-05 | Discharge: 2022-12-05 | Disposition: A | Discharge status: Home / Self Care | Payer: Medicaid Other | Source: Ambulatory Visit | Attending: Obstetrics & Gynecology | Admitting: Obstetrics & Gynecology

## 2022-12-05 ENCOUNTER — Other Ambulatory Visit: Payer: Medicaid Other

## 2022-12-05 DIAGNOSIS — Z09 Encounter for follow-up examination after completed treatment for conditions other than malignant neoplasm: Secondary | ICD-10-CM | POA: Diagnosis present

## 2022-12-05 DIAGNOSIS — Z8619 Personal history of other infectious and parasitic diseases: Secondary | ICD-10-CM

## 2022-12-05 NOTE — Progress Notes (Signed)
   NURSE VISIT- VAGINITIS/STD/POC  SUBJECTIVE:  Jamie Evans is a 52 y.o. [id number] GYN patientfemale here for a vaginal swab for proof of cure after treatment for Trichomonas.  She reports the following symptoms: none   Denies abnormal vaginal bleeding, significant pelvic pain, fever, or UTI symptoms.  OBJECTIVE:  LMP 10/11/2014   Appears well, in no apparent distress  ASSESSMENT: Vaginal swab for proof of cure after treatment for Trichomonas  PLAN: Self-collected vaginal probe for Trichomonas sent to lab Treatment: to be determined once results are received Follow-up as needed if symptoms persist/worsen, or new symptoms develop  Caralyn Guile  12/05/2022 8:46 AM

## 2022-12-06 LAB — CERVICOVAGINAL ANCILLARY ONLY
Comment: NEGATIVE
Trichomonas: NEGATIVE

## 2022-12-12 ENCOUNTER — Encounter: Payer: Self-pay | Admitting: Gynecologic Oncology
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
## 2022-12-14 ENCOUNTER — Inpatient Hospital Stay (HOSPITAL_BASED_OUTPATIENT_CLINIC_OR_DEPARTMENT_OTHER): Payer: Medicaid Other | Admitting: Gynecologic Oncology

## 2022-12-14 ENCOUNTER — Inpatient Hospital Stay: Payer: Medicaid Other | Attending: Gynecologic Oncology | Admitting: Gynecologic Oncology

## 2022-12-14 ENCOUNTER — Encounter: Payer: Self-pay | Admitting: Gynecologic Oncology

## 2022-12-14 VITALS — BP 127/83 | HR 75 | Temp 97.7°F | Resp 20 | Ht 64.0 in | Wt 153.2 lb

## 2022-12-14 DIAGNOSIS — Z79899 Other long term (current) drug therapy: Secondary | ICD-10-CM | POA: Diagnosis not present

## 2022-12-14 DIAGNOSIS — F419 Anxiety disorder, unspecified: Secondary | ICD-10-CM | POA: Diagnosis not present

## 2022-12-14 DIAGNOSIS — Z801 Family history of malignant neoplasm of trachea, bronchus and lung: Secondary | ICD-10-CM | POA: Diagnosis not present

## 2022-12-14 DIAGNOSIS — F17210 Nicotine dependence, cigarettes, uncomplicated: Secondary | ICD-10-CM | POA: Insufficient documentation

## 2022-12-14 DIAGNOSIS — Z809 Family history of malignant neoplasm, unspecified: Secondary | ICD-10-CM | POA: Diagnosis not present

## 2022-12-14 DIAGNOSIS — Z720 Tobacco use: Secondary | ICD-10-CM

## 2022-12-14 DIAGNOSIS — Z87440 Personal history of urinary (tract) infections: Secondary | ICD-10-CM | POA: Insufficient documentation

## 2022-12-14 DIAGNOSIS — F99 Mental disorder, not otherwise specified: Secondary | ICD-10-CM | POA: Diagnosis not present

## 2022-12-14 DIAGNOSIS — I10 Essential (primary) hypertension: Secondary | ICD-10-CM | POA: Diagnosis not present

## 2022-12-14 DIAGNOSIS — D071 Carcinoma in situ of vulva: Secondary | ICD-10-CM | POA: Diagnosis not present

## 2022-12-14 DIAGNOSIS — Z8742 Personal history of other diseases of the female genital tract: Secondary | ICD-10-CM | POA: Diagnosis not present

## 2022-12-14 DIAGNOSIS — B977 Papillomavirus as the cause of diseases classified elsewhere: Secondary | ICD-10-CM | POA: Diagnosis not present

## 2022-12-14 NOTE — Patient Instructions (Signed)
Preparing for your Surgery  Plan for surgery on January 04, 2023 with Dr. Eugene Garnet at [address] [facility]. You will be scheduled for pelvic examination under anesthesia, wide local excision of the vulva, carbon dioxide laser application to the vulva.   Pre-operative Testing -You will receive a phone call from presurgical testing at [facility] to discuss surgery instructions and arrange for lab work if needed.  -Bring your insurance card, copy of an advanced directive if applicable, medication list.  -You should not be taking blood thinners or aspirin at least ten days prior to surgery unless instructed by your surgeon.  -Do not take supplements such as fish oil (omega 3), red yeast rice, turmeric before your surgery. You want to avoid medications with aspirin in them including headache powders such as BC or Goody's), Excedrin migraine.  Day Before Surgery at Home -You will be advised you can have clear liquids up until 3 hours before your surgery.    Your role in recovery Your role is to become active as soon as directed by your doctor, while still giving yourself time to heal.  Rest when you feel tired. You will be asked to do the following in order to speed your recovery:  - Cough and breathe deeply. This helps to clear and expand your lungs and can prevent pneumonia after surgery.  - STAY ACTIVE WHEN YOU GET HOME. Do mild physical activity. Walking or moving your legs help your circulation and body functions return to normal. Do not try to get up or walk alone the first time after surgery.   -If you develop swelling on one leg or the other, pain in the back of your leg, redness/warmth in one of your legs, please call the office or go to the Emergency Room to have a doppler to rule out a blood clot. For shortness of breath, chest pain-seek care in the Emergency Room as soon as possible. - Actively manage your pain. Managing your pain lets you move in  comfort. We will ask you to rate your pain on a scale of zero to 10. It is your responsibility to tell your doctor or nurse where and how much you hurt so your pain can be treated.  Special Considerations -Your final pathology results from surgery should be available around one week after surgery and the results will be relayed to you when available.  -FMLA forms can be faxed to [phone] and please allow 5-7 business days for completion.  Pain Management After Surgery -Make sure that you have Tylenol and Ibuprofen at home IF YOU ARE ABLE TO TAKE THESE MEDICATIONS to use on a regular basis after surgery for pain control. We recommend alternating the medications every hour to six hours since they work differently and are processed in the body differently for pain relief.  -Review the attached handout on narcotic use and their risks and side effects.   Bowel Regimen -You will be prescribed Sennakot-S to take nightly to prevent constipation especially if you are taking the narcotic pain medication intermittently.  It is important to prevent constipation and drink adequate amounts of liquids. You can stop taking this medication when you are not taking pain medication and you are back on your normal bowel routine.  Risks of Surgery Risks of surgery are low but include bleeding, infection, damage to surrounding structures, re-operation, blood clots, and very rarely death.  AFTER SURGERY INSTRUCTIONS  Return to work: variable based on occupation  We recommend  purchasing several bags of frozen green peas and dividing them into ziploc bags. You will want to keep these in the freezer and have them ready to use as ice packs to the vulvar incision. Once the ice pack is no longer cold, you can get another from the freezer. The frozen peas mold to your body better than a regular ice pack.   Activity: 1. Be up and out of the bed during the day.  Take a nap if needed.  You may walk up steps but be  careful and use the hand rail.  Stair climbing will tire you more than you think, you may need to stop part way and rest.   2. No lifting or straining for 4 weeks over 10 pounds. No pushing, pulling, straining for 4 weeks.  3. No driving for minimum 24 hours after surgery but this is usually longer until the following criteria have been met: Do not drive if you are taking narcotic pain medicine and make sure that your reaction time has returned.   4. You can shower as soon as the next day after surgery. Shower daily. No tub baths or submerging your body in water until cleared by your surgeon. If you have the soap that was given to you by pre-surgical testing that was used before surgery, you do not need to use it afterwards because this can irritate your incisions.   5. No sexual activity and nothing in the vagina for 4 weeks.  6. You may experience vulvar spotting and discharge after surgery.  The spotting is normal but if you experience heavy bleeding, call our office.  7. Take Tylenol or ibuprofen first for pain if you are able to take these medications and only use narcotic pain medication for severe pain not relieved by the Tylenol or Ibuprofen.  Monitor your Tylenol intake to a max of 4,000 mg in a 24 hour period. You can alternate these medications after surgery.  Diet: 1. Low sodium Heart Healthy Diet is recommended but you are cleared to resume your normal (before surgery) diet after your procedure.  2. It is safe to use a laxative, such as Miralax or Colace, if you have difficulty moving your bowels. You will be prescribed Sennakot at bedtime every evening to keep bowel movements regular and to prevent constipation.    Wound Care: 1. Keep clean and dry.  Shower daily.  Reasons to call the Doctor: Fever - Oral temperature greater than 100.4 degrees Fahrenheit Foul-smelling vaginal discharge Difficulty urinating Nausea and vomiting Increased pain at the site of the incision that is  unrelieved with pain medicine. Difficulty breathing with or without chest pain New calf pain especially if only on one side Sudden, continuing increased vaginal bleeding with or without clots.   Contacts: For questions or concerns you should contact:  Dr. Eugene Garnet at [phone]  Warner Mccreedy, NP at [phone]  After Hours: call [phone] and have the GYN Oncologist paged/contacted (after 5 pm or on the weekends).  Messages sent via mychart are for non-urgent matters and are not responded to after hours so for urgent needs, please call the after hours number.

## 2022-12-15 ENCOUNTER — Encounter: Payer: Self-pay | Admitting: Gynecologic Oncology

## 2022-12-19 NOTE — Patient Instructions (Signed)
 Preparing for your Surgery  Plan for surgery on January 04, 2023 with Dr. Eugene Garnet at [address] [facility]. You will be scheduled for pelvic examination under anesthesia, wide local excision of the vulva, carbon dioxide laser application to the vulva.   Pre-operative Testing -You will receive a phone call from presurgical testing at [facility] to discuss surgery instructions and arrange for lab work if needed.  -Bring your insurance card, copy of an advanced directive if applicable, medication list.  -You should not be taking blood thinners or aspirin at least ten days prior to surgery unless instructed by your surgeon.  -Do not take supplements such as fish oil (omega 3), red yeast rice, turmeric before your surgery. You want to avoid medications with aspirin in them including headache powders such as BC or Goody's), Excedrin migraine.  Day Before Surgery at Home -You will be advised you can have clear liquids up until 3 hours before your surgery.    Your role in recovery Your role is to become active as soon as directed by your doctor, while still giving yourself time to heal.  Rest when you feel tired. You will be asked to do the following in order to speed your recovery:  - Cough and breathe deeply. This helps to clear and expand your lungs and can prevent pneumonia after surgery.  - STAY ACTIVE WHEN YOU GET HOME. Do mild physical activity. Walking or moving your legs help your circulation and body functions return to normal. Do not try to get up or walk alone the first time after surgery.   -If you develop swelling on one leg or the other, pain in the back of your leg, redness/warmth in one of your legs, please call the office or go to the Emergency Room to have a doppler to rule out a blood clot. For shortness of breath, chest pain-seek care in the Emergency Room as soon as possible. - Actively manage your pain. Managing your pain lets you move in  comfort. We will ask you to rate your pain on a scale of zero to 10. It is your responsibility to tell your doctor or nurse where and how much you hurt so your pain can be treated.  Special Considerations -Your final pathology results from surgery should be available around one week after surgery and the results will be relayed to you when available.  -FMLA forms can be faxed to [phone] and please allow 5-7 business days for completion.  Pain Management After Surgery -Make sure that you have Tylenol and Ibuprofen at home IF YOU ARE ABLE TO TAKE THESE MEDICATIONS to use on a regular basis after surgery for pain control. We recommend alternating the medications every hour to six hours since they work differently and are processed in the body differently for pain relief.  -Review the attached handout on narcotic use and their risks and side effects.   Bowel Regimen -You will be prescribed Sennakot-S to take nightly to prevent constipation especially if you are taking the narcotic pain medication intermittently.  It is important to prevent constipation and drink adequate amounts of liquids. You can stop taking this medication when you are not taking pain medication and you are back on your normal bowel routine.  Risks of Surgery Risks of surgery are low but include bleeding, infection, damage to surrounding structures, re-operation, blood clots, and very rarely death.  AFTER SURGERY INSTRUCTIONS  Return to work: variable based on occupation  We recommend  purchasing several bags of frozen green peas and dividing them into ziploc bags. You will want to keep these in the freezer and have them ready to use as ice packs to the vulvar incision. Once the ice pack is no longer cold, you can get another from the freezer. The frozen peas mold to your body better than a regular ice pack.   Activity: 1. Be up and out of the bed during the day.  Take a nap if needed.  You may walk up steps but be  careful and use the hand rail.  Stair climbing will tire you more than you think, you may need to stop part way and rest.   2. No lifting or straining for 4 weeks over 10 pounds. No pushing, pulling, straining for 4 weeks.  3. No driving for minimum 24 hours after surgery but this is usually longer until the following criteria have been met: Do not drive if you are taking narcotic pain medicine and make sure that your reaction time has returned.   4. You can shower as soon as the next day after surgery. Shower daily. No tub baths or submerging your body in water until cleared by your surgeon. If you have the soap that was given to you by pre-surgical testing that was used before surgery, you do not need to use it afterwards because this can irritate your incisions.   5. No sexual activity and nothing in the vagina for 4 weeks.  6. You may experience vulvar spotting and discharge after surgery.  The spotting is normal but if you experience heavy bleeding, call our office.  7. Take Tylenol or ibuprofen first for pain if you are able to take these medications and only use narcotic pain medication for severe pain not relieved by the Tylenol or Ibuprofen.  Monitor your Tylenol intake to a max of 4,000 mg in a 24 hour period. You can alternate these medications after surgery.  Diet: 1. Low sodium Heart Healthy Diet is recommended but you are cleared to resume your normal (before surgery) diet after your procedure.  2. It is safe to use a laxative, such as Miralax or Colace, if you have difficulty moving your bowels. You will be prescribed Sennakot at bedtime every evening to keep bowel movements regular and to prevent constipation.    Wound Care: 1. Keep clean and dry.  Shower daily.  Reasons to call the Doctor: Fever - Oral temperature greater than 100.4 degrees Fahrenheit Foul-smelling vaginal discharge Difficulty urinating Nausea and vomiting Increased pain at the site of the incision that is  unrelieved with pain medicine. Difficulty breathing with or without chest pain New calf pain especially if only on one side Sudden, continuing increased vaginal bleeding with or without clots.   Contacts: For questions or concerns you should contact:  Dr. Eugene Garnet at [phone]  Warner Mccreedy, NP at [phone]  After Hours: call [phone] and have the GYN Oncologist paged/contacted (after 5 pm or on the weekends).  Messages sent via mychart are for non-urgent matters and are not responded to after hours so for urgent needs, please call the after hours number.

## 2022-12-19 NOTE — Progress Notes (Signed)
Patient here for new patient consultation with Dr. Pricilla Holm and for a pre-operative appointment prior to her scheduled surgery on 01/04/2023. She is scheduled for pelvic examination under anesthesia, wide local excision of the vulva, carbon dioxide laser application to the vulva. The surgery was discussed in detail.  See after visit summary for additional details.   Discussed post-op pain management in detail including the aspects of the enhanced recovery pathway.  Advised her that a new prescription would be sent in closer to the surgery date and it is only to be used for after her upcoming surgery.  We discussed the use of tylenol post-op and to monitor for a maximum of 4,000 mg in a 24 hour period.  Also plan to prescribe sennakot to be used after surgery and to hold if having loose stools.     Discussed measures to take at home to prevent DVT including frequent mobility.  Reportable signs and symptoms of DVT discussed. Post-operative instructions discussed and expectations for after surgery. Incisional care discussed as well including reportable signs and symptoms including erythema, drainage, wound separation.     10 minutes spent preparing information and with the patient.  Verbalizing understanding of material discussed. No needs or concerns voiced at the end of the visit.   Advised patient to call for any needs.    This appointment is included in the global surgical bundle as pre-operative teaching and has no charge.

## 2022-12-25 ENCOUNTER — Encounter (HOSPITAL_BASED_OUTPATIENT_CLINIC_OR_DEPARTMENT_OTHER): Payer: Self-pay | Admitting: Gynecologic Oncology

## 2022-12-25 NOTE — Progress Notes (Signed)
Spoke w/ via phone for pre-op interview--- Jamie Evans Lab needs dos----  EKG and ISTAT per anesthesia       Lab results------ COVID test -----patient states asymptomatic no test needed Arrive at -------1100 NPO after MN NO Solid Food.  Clear liquids from MN until---1000 Med rec completed Medications to take morning of surgery -----NONE Diabetic medication ----- Patient instructed no nail polish to be worn day of surgery Patient instructed to bring photo id and insurance card day of surgery Patient aware to have Driver (ride ) / caregiver    for 24 hours after surgery - Daughter Jamie Evans Patient Special Instructions ----- Pre-Op special Instructions ----- Patient verbalized understanding of instructions that were given at this phone interview. Patient denies chest pain, sob, fever, cough at the interview.

## 2023-01-03 ENCOUNTER — Other Ambulatory Visit: Payer: Self-pay | Admitting: Gynecologic Oncology

## 2023-01-03 ENCOUNTER — Telehealth: Payer: Self-pay

## 2023-01-03 DIAGNOSIS — D071 Carcinoma in situ of vulva: Secondary | ICD-10-CM

## 2023-01-03 DIAGNOSIS — G8918 Other acute postprocedural pain: Secondary | ICD-10-CM

## 2023-01-03 MED ORDER — OXYCODONE HCL 5 MG PO TABS: 5.0000 mg | ORAL_TABLET | ORAL | 0 refills | Status: DC | PRN

## 2023-01-03 MED ORDER — IBUPROFEN 800 MG PO TABS: 800.0000 mg | ORAL_TABLET | Freq: Three times a day (TID) | ORAL | 0 refills | Status: DC | PRN

## 2023-01-03 MED ORDER — SENNOSIDES-DOCUSATE SODIUM 8.6-50 MG PO TABS: 2.0000 | ORAL_TABLET | Freq: Every day | ORAL | 0 refills | Status: DC

## 2023-01-03 NOTE — Telephone Encounter (Signed)
Telephone call to check on pre-operative status.  Patient compliant with pre-operative instructions.  Reinforced nothing to eat after midnight. Clear liquids until 1000. Patient to arrive at 1100.  No questions or concerns voiced.  Instructed to call for any needs.

## 2023-01-03 NOTE — Discharge Instructions (Signed)
AFTER SURGERY INSTRUCTIONS   Return to work: variable based on occupation   We recommend purchasing several bags of frozen green peas and dividing them into ziploc bags. You will want to keep these in the freezer and have them ready to use as ice packs to the vulvar incision. Once the ice pack is no longer cold, you can get another from the freezer. The frozen peas mold to your body better than a regular ice pack.   Today Dr. Pricilla Holm used a special type of numbing medication that lasts 3 days in your vulva incision. After 3 days you can start use of topical lidocaine as needed for discomfort.   Your kidney fucntion was slightly elevated on your labs this am. Make sure you are staying hydrated and use ibuprofen sparingly.   Activity: 1. Be up and out of the bed during the day.  Take a nap if needed.  You may walk up steps but be careful and use the hand rail.  Stair climbing will tire you more than you think, you may need to stop part way and rest.    2. No lifting or straining for 4 weeks over 10 pounds. No pushing, pulling, straining for 4 weeks.   3. No driving for minimum 24 hours after surgery but this is usually longer until the following criteria have been met: Do not drive if you are taking narcotic pain medicine and make sure that your reaction time has returned.    4. You can shower as soon as the next day after surgery. Shower daily. No tub baths or submerging your body in water until cleared by your surgeon. If you have the soap that was given to you by pre-surgical testing that was used before surgery, you do not need to use it afterwards because this can irritate your incisions.    5. No sexual activity and nothing in the vagina for 4 weeks.   6. You may experience vulvar spotting and discharge after surgery.  The spotting is normal but if you experience heavy bleeding, call our office.   7. Take Tylenol or ibuprofen (sparingly) first for pain if you are able to take these  medications and only use narcotic pain medication for severe pain not relieved by the Tylenol or Ibuprofen.  Monitor your Tylenol intake to a max of 4,000 mg in a 24 hour period. You can alternate these medications after surgery.   Diet: 1. Low sodium Heart Healthy Diet is recommended but you are cleared to resume your normal (before surgery) diet after your procedure.   2. It is safe to use a laxative, such as Miralax or Colace, if you have difficulty moving your bowels. You will be prescribed Sennakot at bedtime every evening to keep bowel movements regular and to prevent constipation.     Wound Care: 1. Keep clean and dry.  Shower daily.   Reasons to call the Doctor: Fever - Oral temperature greater than 100.4 degrees Fahrenheit Foul-smelling vaginal discharge Difficulty urinating Nausea and vomiting Increased pain at the site of the incision that is unrelieved with pain medicine. Difficulty breathing with or without chest pain New calf pain especially if only on one side Sudden, continuing increased vaginal bleeding with or without clots.   Contacts: For questions or concerns you should contact:   Dr. Eugene Garnet at [phone]   Warner Mccreedy, NP at [phone]   After Hours: call [phone] and have the GYN Oncologist paged/contacted (after 5 pm or on the weekends).  Messages sent via mychart are for non-urgent matters and are not responded to after hours so for urgent needs, please call the after hours number. Post Anesthesia Home Care Instructions  Activity: Get plenty of rest for the remainder of the day. A responsible individual must stay with you for 24 hours following the procedure.  For the next 24 hours, DO NOT: -Drive a car -Advertising copywriter -Drink alcoholic beverages -Take any medication unless instructed by your physician -Make any legal decisions or sign important papers.  Meals: Start with liquid foods such as gelatin or soup. Progress to  regular foods as tolerated. Avoid greasy, spicy, heavy foods. If nausea and/or vomiting occur, drink only clear liquids until the nausea and/or vomiting subsides. Call your physician if vomiting continues.  Special Instructions/Symptoms: Your throat may feel dry or sore from the anesthesia or the breathing tube placed in your throat during surgery. If this causes discomfort, gargle with warm salt water. The discomfort should disappear within 24 hours.  If you had a scopolamine patch placed behind your ear for the management of post- operative nausea and/or vomiting:  1. The medication in the patch is effective for 72 hours, after which it should be removed.  Wrap patch in a tissue and discard in the trash. Wash hands thoroughly with soap and water. 2. You may remove the patch earlier than 72 hours if you experience unpleasant side effects which may include dry mouth, dizziness or visual disturbances. 3. Avoid touching the patch. Wash your hands with soap and water after contact with the patch.

## 2023-01-03 NOTE — Progress Notes (Signed)
Post-op meds sent in preop. 

## 2023-01-04 ENCOUNTER — Encounter (HOSPITAL_BASED_OUTPATIENT_CLINIC_OR_DEPARTMENT_OTHER)
Admission: RE | Disposition: A | Discharge status: Home / Self Care | Payer: Self-pay | Source: Home / Self Care | Attending: Gynecologic Oncology

## 2023-01-04 ENCOUNTER — Other Ambulatory Visit: Payer: Self-pay

## 2023-01-04 ENCOUNTER — Ambulatory Visit (HOSPITAL_BASED_OUTPATIENT_CLINIC_OR_DEPARTMENT_OTHER): Payer: Medicaid Other | Admitting: Certified Registered Nurse Anesthetist

## 2023-01-04 ENCOUNTER — Encounter (HOSPITAL_BASED_OUTPATIENT_CLINIC_OR_DEPARTMENT_OTHER): Payer: Self-pay | Admitting: Gynecologic Oncology

## 2023-01-04 ENCOUNTER — Ambulatory Visit (HOSPITAL_BASED_OUTPATIENT_CLINIC_OR_DEPARTMENT_OTHER)
Admission: RE | Admit: 2023-01-04 | Discharge: 2023-01-04 | Disposition: A | Discharge status: Home / Self Care | Payer: Medicaid Other | Attending: Gynecologic Oncology | Admitting: Gynecologic Oncology

## 2023-01-04 DIAGNOSIS — F17200 Nicotine dependence, unspecified, uncomplicated: Secondary | ICD-10-CM | POA: Diagnosis not present

## 2023-01-04 DIAGNOSIS — D071 Carcinoma in situ of vulva: Secondary | ICD-10-CM

## 2023-01-04 DIAGNOSIS — D072 Carcinoma in situ of vagina: Secondary | ICD-10-CM

## 2023-01-04 DIAGNOSIS — I10 Essential (primary) hypertension: Secondary | ICD-10-CM | POA: Insufficient documentation

## 2023-01-04 DIAGNOSIS — Z780 Asymptomatic menopausal state: Secondary | ICD-10-CM | POA: Diagnosis not present

## 2023-01-04 HISTORY — PX: VULVA /PERINEUM BIOPSY: SHX319

## 2023-01-04 HISTORY — PX: VULVECTOMY: SHX1086

## 2023-01-04 LAB — POCT I-STAT, CHEM 8
BUN: 5 mg/dL — ABNORMAL LOW (ref 6–20)
Calcium, Ion: 1.08 mmol/L — ABNORMAL LOW (ref 1.15–1.40)
Chloride: 99 mmol/L (ref 98–111)
Creatinine, Ser: 0.8 mg/dL (ref 0.44–1.00)
Glucose, Bld: 108 mg/dL — ABNORMAL HIGH (ref 70–99)
HCT: 45 % (ref 36.0–46.0)
Hemoglobin: 15.3 g/dL — ABNORMAL HIGH (ref 12.0–15.0)
Potassium: 3.9 mmol/L (ref 3.5–5.1)
Sodium: 137 mmol/L (ref 135–145)
TCO2: 26 mmol/L (ref 22–32)

## 2023-01-04 SURGERY — WIDE EXCISION VULVECTOMY
Anesthesia: General | Site: Vagina

## 2023-01-04 MED ORDER — PHENYLEPHRINE 80 MCG/ML (10ML) SYRINGE FOR IV PUSH (FOR BLOOD PRESSURE SUPPORT)
PREFILLED_SYRINGE | INTRAVENOUS | Status: DC | PRN
  Administered 2023-01-04: 80 ug via INTRAVENOUS

## 2023-01-04 MED ORDER — SCOPOLAMINE 1 MG/3DAYS TD PT72
MEDICATED_PATCH | TRANSDERMAL | Status: AC
Start: 2023-01-04 — End: ?
  Filled 2023-01-04: qty 1

## 2023-01-04 MED ORDER — BUPIVACAINE LIPOSOME 1.3 % IJ SUSP
INTRAMUSCULAR | Status: DC | PRN
  Administered 2023-01-04: 20 mL

## 2023-01-04 MED ORDER — MIDAZOLAM HCL 2 MG/2ML IJ SOLN
INTRAMUSCULAR | Status: AC
  Filled 2023-01-04: qty 2

## 2023-01-04 MED ORDER — EPHEDRINE SULFATE-NACL 50-0.9 MG/10ML-% IV SOSY
PREFILLED_SYRINGE | INTRAVENOUS | Status: DC | PRN
  Administered 2023-01-04: 10 mg via INTRAVENOUS
  Administered 2023-01-04: 5 mg via INTRAVENOUS

## 2023-01-04 MED ORDER — FENTANYL CITRATE (PF) 100 MCG/2ML IJ SOLN
INTRAMUSCULAR | Status: AC
  Filled 2023-01-04: qty 2

## 2023-01-04 MED ORDER — PROPOFOL 10 MG/ML IV BOLUS
INTRAVENOUS | Status: AC
  Filled 2023-01-04: qty 20

## 2023-01-04 MED ORDER — LACTATED RINGERS IV SOLN: INTRAVENOUS | Status: DC | PRN

## 2023-01-04 MED ORDER — PROPOFOL 10 MG/ML IV BOLUS
INTRAVENOUS | Status: DC | PRN
  Administered 2023-01-04: 200 mg via INTRAVENOUS

## 2023-01-04 MED ORDER — SCOPOLAMINE 1 MG/3DAYS TD PT72
1.0000 | MEDICATED_PATCH | TRANSDERMAL | Status: DC
  Administered 2023-01-04: 1.5 mg via TRANSDERMAL

## 2023-01-04 MED ORDER — ACETAMINOPHEN 500 MG PO TABS
1000.0000 mg | ORAL_TABLET | ORAL | Status: AC
  Administered 2023-01-04: 1000 mg via ORAL

## 2023-01-04 MED ORDER — DEXMEDETOMIDINE HCL IN NACL 80 MCG/20ML IV SOLN
INTRAVENOUS | Status: DC | PRN
  Administered 2023-01-04: 4 ug via INTRAVENOUS

## 2023-01-04 MED ORDER — DEXAMETHASONE SODIUM PHOSPHATE 10 MG/ML IJ SOLN
INTRAMUSCULAR | Status: DC | PRN
  Administered 2023-01-04: 8 mg via INTRAVENOUS

## 2023-01-04 MED ORDER — BUPIVACAINE HCL 0.25 % IJ SOLN
INTRAMUSCULAR | Status: DC | PRN
  Administered 2023-01-04: 10 mL

## 2023-01-04 MED ORDER — ACETIC ACID 5 % SOLN
Status: DC | PRN
  Administered 2023-01-04: 1 via TOPICAL

## 2023-01-04 MED ORDER — FENTANYL CITRATE (PF) 250 MCG/5ML IJ SOLN
INTRAMUSCULAR | Status: DC | PRN
  Administered 2023-01-04 (×2): 50 ug via INTRAVENOUS

## 2023-01-04 MED ORDER — LIDOCAINE 5 % EX OINT: 1.0000 | TOPICAL_OINTMENT | CUTANEOUS | 1 refills | Status: DC | PRN

## 2023-01-04 MED ORDER — LIDOCAINE 2% (20 MG/ML) 5 ML SYRINGE
INTRAMUSCULAR | Status: DC | PRN
  Administered 2023-01-04: 60 mg via INTRAVENOUS

## 2023-01-04 MED ORDER — STERILE WATER FOR IRRIGATION IR SOLN
Status: DC | PRN
  Administered 2023-01-04: 1000 mL

## 2023-01-04 MED ORDER — ACETAMINOPHEN 500 MG PO TABS
ORAL_TABLET | ORAL | Status: AC
Start: 2023-01-04 — End: ?
  Filled 2023-01-04: qty 2

## 2023-01-04 MED ORDER — LACTATED RINGERS IV SOLN
INTRAVENOUS | Status: DC
Start: 2023-01-04 — End: 2023-01-04

## 2023-01-04 MED ORDER — ONDANSETRON HCL 4 MG/2ML IJ SOLN
INTRAMUSCULAR | Status: DC | PRN
  Administered 2023-01-04: 4 mg via INTRAVENOUS

## 2023-01-04 MED ORDER — MIDAZOLAM HCL 2 MG/2ML IJ SOLN
INTRAMUSCULAR | Status: DC | PRN
  Administered 2023-01-04: 2 mg via INTRAVENOUS

## 2023-01-04 MED ORDER — DEXAMETHASONE SODIUM PHOSPHATE 10 MG/ML IJ SOLN: 4.0000 mg | INTRAMUSCULAR | Status: DC

## 2023-01-04 SURGICAL SUPPLY — 36 items
BLADE CLIPPER SENSICLIP SURGIC (BLADE) ×1 IMPLANT
BLADE SURG 15 STRL LF DISP TIS (BLADE) ×2 IMPLANT
CATH ROBINSON RED A/P 16FR (CATHETERS) ×1 IMPLANT
DEPRESSOR TONGUE 6 IN STERILE (GAUZE/BANDAGES/DRESSINGS) ×2 IMPLANT
DRSG TELFA 3X8 NADH STRL (GAUZE/BANDAGES/DRESSINGS) IMPLANT
ELECT BALL LEEP 3MM BLK (ELECTRODE) IMPLANT
GAUZE 4X4 16PLY ~~LOC~~+RFID DBL (SPONGE) ×1 IMPLANT
GLOVE BIO SURGEON STRL SZ 6 (GLOVE) ×4 IMPLANT
GLOVE SURG SS PI 7.0 STRL IVOR (GLOVE) ×2 IMPLANT
GOWN STRL REUS W/TWL LRG LVL3 (GOWN DISPOSABLE) ×4 IMPLANT
KIT TURNOVER CYSTO (KITS) ×2 IMPLANT
NDL HYPO 25X1 1.5 SAFETY (NEEDLE) ×1 IMPLANT
NEEDLE HYPO 25X1 1.5 SAFETY (NEEDLE) ×2
NS IRRIG 500ML POUR BTL (IV SOLUTION) ×1 IMPLANT
PACK PERINEAL COLD (PAD) ×2 IMPLANT
PACK VAGINAL WOMENS (CUSTOM PROCEDURE TRAY) ×2 IMPLANT
PAD PREP 24X48 CUFFED NSTRL (MISCELLANEOUS) ×2 IMPLANT
PUNCH BIOPSY DERMAL 3 (INSTRUMENTS) IMPLANT
PUNCH BIOPSY DERMAL 4MM (INSTRUMENTS) IMPLANT
SLEEVE SCD COMPRESS KNEE MED (STOCKING) ×2 IMPLANT
SPIKE FLUID TRANSFER (MISCELLANEOUS) ×1 IMPLANT
SUT VIC AB 0 CT1 36 (SUTURE) IMPLANT
SUT VIC AB 0 SH 27 (SUTURE) ×2 IMPLANT
SUT VIC AB 2-0 CT2 27 (SUTURE) IMPLANT
SUT VIC AB 2-0 SH 27XBRD (SUTURE) ×2 IMPLANT
SUT VIC AB 3-0 PS2 18XBRD (SUTURE) IMPLANT
SUT VIC AB 3-0 SH 27X BRD (SUTURE) ×3 IMPLANT
SUT VIC AB 4-0 PS2 18 (SUTURE) ×4 IMPLANT
SUT VICRYL 0 UR6 27IN ABS (SUTURE) IMPLANT
SWAB OB GYN 8IN STERILE 2PK (MISCELLANEOUS) ×2 IMPLANT
SYR BULB IRRIG 60ML STRL (SYRINGE) ×2 IMPLANT
TOWEL OR 17X24 6PK STRL BLUE (TOWEL DISPOSABLE) ×2 IMPLANT
TUBE CONNECTING 12X1/4 (SUCTIONS) ×1 IMPLANT
VACUUM HOSE 7/8X10 W/ WAND (MISCELLANEOUS) IMPLANT
WATER STERILE IRR 1000ML POUR (IV SOLUTION) ×1 IMPLANT
WATER STERILE IRR 500ML POUR (IV SOLUTION) ×1 IMPLANT

## 2023-01-04 NOTE — Interval H&P Note (Signed)
History and Physical Interval Note:  01/04/2023 11:20 AM  Jamie Evans  has presented today for surgery, with the diagnosis of VIN3.  The various methods of treatment have been discussed with the patient and family. After consideration of risks, benefits and other options for treatment, the patient has consented to  Procedure(s): WIDE EXCISION VULVECTOMY (N/A) CO2 LASER APPLICATION TO VULVA (N/A) VULVAR BIOPSY (N/A) as a surgical intervention.  The patient's history has been reviewed, patient examined, no change in status, stable for surgery.  I have reviewed the patient's chart and labs.  Questions were answered to the patient's satisfaction.     Carver Fila

## 2023-01-04 NOTE — Anesthesia Procedure Notes (Signed)
Procedure Name: LMA Insertion Date/Time: 01/04/2023 12:44 PM  Performed by: Earmon Phoenix, CRNAPre-anesthesia Checklist: Patient identified, Emergency Drugs available, Suction available, Patient being monitored and Timeout performed Patient Re-evaluated:Patient Re-evaluated prior to induction Oxygen Delivery Method: Circle system utilized Preoxygenation: Pre-oxygenation with 100% oxygen Induction Type: IV induction Ventilation: Mask ventilation without difficulty LMA: LMA inserted LMA Size: 4.0 Placement Confirmation: positive ETCO2 and breath sounds checked- equal and bilateral Tube secured with: Tape Dental Injury: Teeth and Oropharynx as per pre-operative assessment

## 2023-01-04 NOTE — Transfer of Care (Signed)
Immediate Anesthesia Transfer of Care Note  Patient: Jamie Evans  Procedure(s) Performed: WIDE EXCISION VULVECTOMY (Vagina ) VULVAR BIOPSY (Vagina )  Patient Location: PACU  Anesthesia Type:General  Level of Consciousness: awake and patient cooperative  Airway & Oxygen Therapy: Patient Spontanous Breathing and Patient connected to nasal cannula oxygen  Post-op Assessment: Report given to RN and Post -op Vital signs reviewed and stable  Post vital signs: Reviewed and stable  Last Vitals:  Vitals Value Taken Time  BP    Temp 36.1 C 01/04/23 1332  Pulse 67 01/04/23 1333  Resp 10 01/04/23 1333  SpO2 100 % 01/04/23 1333  Vitals shown include unfiled device data.  Last Pain:  Vitals:   01/04/23 1130  TempSrc: Oral         Complications: No notable events documented.

## 2023-01-04 NOTE — Op Note (Signed)
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 2 cm hyperpigmented lesion on left anterior labia majora. Some leukoplakia in close proximity, less than 1 cm to the clitoris, but all visually removed.Right vulva without lesions. Vaginal mucosa and cervix, normal appearing, atrophic.    Procedure: The patient was identified in the preoperative holding area. Informed consent was signed on the chart. Patient was seen history was reviewed and exam was performed.   The patient was then taken to the operating room and placed in the supine position with SCD hose on. General anesthesia was then induced without difficulty. She was then placed in the dorsolithotomy position. The perineum was prepped with Betadine. The vagina was prepped with Betadine. The patient was then draped after the prep was dried.   Timeout was performed the patient, procedure, antibiotic, allergy, and length of procedure. 5% acetic acid solution was applied to the perineum. The vulvar tissues were inspected for areas of acetowhite changes or leukoplakia. The lesion was identified and the marking pen was used to circumscribe the area with appropriate surgical margins. The subcuticular tissues were infiltrated with 0.25% marcaine. The 15 blade scalpel was used to make an incision through the skin circumferentially as marked. The skin elipse was grasped and was separated from the underlying deep dermal tissues with  the bovie device. After the specimen had been completely resected, it was oriented and marked at 12 o'clock with a 0-vicryl suture. A deep central margin was taken. The bovie was used to obtain hemostasis at the surgical bed. The subcutaneous tissues were irrigated and made hemostatic.   The deep dermal layer was approximated with 2-0 vicryl mattress sutures to bring the skin edges into approximation and off tension. The wound was closed following langher's lines. The superficial subcutaneous tissue was reapproximated using 3-0 mattress sutures and the skin was closed with interrupted 4-0 vicryl stitches and mattress sutures to ensure a tension free and hemostatic closure. The perineum was again irrigated.   Exparel was injected for local anesthesia.  All instrument, suture, laparotomy, Ray-Tec, and needle counts were correct x2. The patient tolerated the procedure well and was taken recovery room in stable condition. This is Jamie Evans dictating an operative note on Jamie Evans. vu

## 2023-01-04 NOTE — Anesthesia Postprocedure Evaluation (Signed)
Anesthesia Post Note  Patient: Jamie Evans  Procedure(s) Performed: WIDE EXCISION VULVECTOMY (Vagina ) VULVAR BIOPSY (Vagina )     Patient location during evaluation: PACU Anesthesia Type: General Level of consciousness: awake and alert Pain management: pain level controlled Vital Signs Assessment: post-procedure vital signs reviewed and stable Respiratory status: spontaneous breathing, nonlabored ventilation, respiratory function stable and patient connected to nasal cannula oxygen Cardiovascular status: blood pressure returned to baseline and stable Postop Assessment: no apparent nausea or vomiting Anesthetic complications: no  No notable events documented.  Last Vitals:  Vitals:   01/04/23 1400 01/04/23 1415  BP:  (!) 144/90  Pulse:  88  Resp:  18  Temp: 36.8 C   SpO2:  97%    Last Pain:  Vitals:   01/04/23 1415  TempSrc:   PainSc: 0-No pain                 Garrett Mitchum S

## 2023-01-04 NOTE — Anesthesia Preprocedure Evaluation (Addendum)
Anesthesia Evaluation  Patient identified by MRN, date of birth, ID band Patient awake    Reviewed: Allergy & Precautions, H&P , NPO status , Patient's Chart, lab work & pertinent test results  Airway Mallampati: II  TM Distance: >3 FB Neck ROM: Full    Dental no notable dental hx.    Pulmonary Current Smoker   Pulmonary exam normal breath sounds clear to auscultation       Cardiovascular hypertension, Pt. on medications Normal cardiovascular exam Rhythm:Regular Rate:Normal     Neuro/Psych negative neurological ROS  negative psych ROS   GI/Hepatic negative GI ROS,,,(+)     substance abuse  IV drug useUsed fentanyl last night   Endo/Other  negative endocrine ROS    Renal/GU negative Renal ROS  negative genitourinary   Musculoskeletal negative musculoskeletal ROS (+)    Abdominal   Peds negative pediatric ROS (+)  Hematology negative hematology ROS (+)   Anesthesia Other Findings Multiple lesions on both arms, erythematous and some purulent  Reproductive/Obstetrics negative OB ROS                             Anesthesia Physical Anesthesia Plan  ASA: 3  Anesthesia Plan: General   Post-op Pain Management: Tylenol PO (pre-op)*   Induction: Intravenous  PONV Risk Score and Plan: 2 and Ondansetron, Dexamethasone and Treatment may vary due to age or medical condition  Airway Management Planned: LMA  Additional Equipment:   Intra-op Plan:   Post-operative Plan: Extubation in OR  Informed Consent: I have reviewed the patients History and Physical, chart, labs and discussed the procedure including the risks, benefits and alternatives for the proposed anesthesia with the patient or authorized representative who has indicated his/her understanding and acceptance.     Dental advisory given  Plan Discussed with: CRNA and Surgeon  Anesthesia Plan Comments:        Anesthesia  Quick Evaluation

## 2023-01-05 ENCOUNTER — Encounter: Payer: Self-pay | Admitting: Gynecologic Oncology

## 2023-01-05 ENCOUNTER — Telehealth: Payer: Self-pay | Admitting: *Deleted

## 2023-01-05 NOTE — Telephone Encounter (Signed)
Spoke with Jamie Evans and she states she is eating, drinking and urinating well. She has not had a BM yet but is passing gas. She is taking Miralax and stool softeners as prescribed and encouraged her to drink plenty of water. She denies fever or chills. Incisions are dry and intact. She rates her pain 9/10. Her pain is controlled with oxycodone. Pt is experiencing pressure in the area of surgery on vulva and burning, advised patient this is normal and to keep using peri bottle and ice packs.    Instructed to call office with any fever, chills, purulent drainage, uncontrolled pain or any other questions or concerns. Patient verbalizes understanding.   Pt aware of post op appointments as well as the office number [phone] and after hours number [phone] to call if she has any questions or concerns

## 2023-01-08 ENCOUNTER — Encounter (HOSPITAL_BASED_OUTPATIENT_CLINIC_OR_DEPARTMENT_OTHER): Payer: Self-pay | Admitting: Gynecologic Oncology

## 2023-01-08 LAB — SURGICAL PATHOLOGY

## 2023-02-12 ENCOUNTER — Ambulatory Visit: Payer: Self-pay | Admitting: Internal Medicine

## 2023-02-16 ENCOUNTER — Encounter: Payer: Self-pay | Admitting: Gynecologic Oncology

## 2023-02-16 ENCOUNTER — Inpatient Hospital Stay: Payer: Medicaid Other | Attending: Gynecologic Oncology | Admitting: Gynecologic Oncology

## 2023-02-16 VITALS — BP 146/88 | HR 87 | Temp 98.5°F | Resp 19 | Wt 147.2 lb

## 2023-02-16 DIAGNOSIS — D071 Carcinoma in situ of vulva: Secondary | ICD-10-CM

## 2023-02-16 DIAGNOSIS — Z9079 Acquired absence of other genital organ(s): Secondary | ICD-10-CM

## 2023-02-16 NOTE — Patient Instructions (Signed)
It was good to see you today.  You have healed very well from surgery.  We will plan on a visit in 6 months.  Please call me if you develop any symptoms including burning, itching, pain on your vulva, or if you see/feel a new spot.

## 2023-02-16 NOTE — Progress Notes (Signed)
Gynecologic Oncology Return Clinic Visit  02/16/23  Reason for Visit: follow-up  Treatment History: The patient reports noticing a bump on her vulva about a year ago.  She kept getting a rash around the area, especially when she would have sweating between her legs.  She had some intermittent burning with urination.  She denies any associated pruritus, bleeding, or discharge.  Since her recent biopsies, her symptoms have resolved.   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  01/04/23: Partial simple left vulvectomy   Interval History: Doing very well.  Other than some discomfort the first few days after surgery, denies any issues.  Denies any bleeding or discharge.  Has struggled with constipation, using MiraLAX as needed.  Past Medical/Surgical History: Past Medical History:  Diagnosis Date   Anxiety 02/11/2013   Husband cusses her and the kids, has not hit her, will refer to [facility]   Gall bladder disease    gal stones   Hematuria 05/19/2014   resolved   Hypertension    Mental disorder    had 'nervous breakdown" 2005   Tobacco abuse    Urinary frequency 05/19/2014   UTI (urinary tract infection) 05/19/2014   Vulvar intraepithelial neoplasia (VIN) grade 3     Past Surgical History:  Procedure Laterality Date   CESAREAN SECTION     4   TUBAL LIGATION     at time of last c-section   VULVA /PERINEUM BIOPSY N/A 01/04/2023   Procedure: VULVAR BIOPSY;  Surgeon: Carver Fila, MD;  [facility];  Service: Gynecology;  Laterality: N/A;   VULVECTOMY N/A 01/04/2023   Procedure: WIDE EXCISION VULVECTOMY;  Surgeon: Carver Fila, MD;  Location: [facility];  Service: Gynecology;  Laterality: N/A;    Family History  Problem Relation Age of Onset   Diabetes Mother    Uterine cancer Mother    Lung cancer Mother    Heart disease Father    Stroke Father    Kidney disease Father     Hypertension Father    Hyperlipidemia Sister    Other Sister        brain tumors   Hypertension Sister    Cancer Maternal Aunt    COPD Maternal Aunt    Cancer Maternal Aunt    Hypertension Maternal Uncle    Heart disease Paternal Aunt    Cancer Paternal Aunt    COPD Maternal Grandmother    Diabetes Maternal Grandmother    Heart attack Paternal Grandmother    Gout Son    Colon cancer Neg Hx    Breast cancer Neg Hx    Ovarian cancer Neg Hx    Prostate cancer Neg Hx    Pancreatic cancer Neg Hx     Social History   Socioeconomic History   Marital status: Legally Separated    Spouse name: Not on file   Number of children: 4   Years of education: Not on file   Highest education level: Not on file  Occupational History   Occupation: works at a nursing home  Tobacco Use   Smoking status: Every Day    Current packs/day: 2.00    Average packs/day: 2.0 packs/day for 31.0 years (62.0 ttl pk-yrs)    Types: Cigarettes   Smokeless tobacco: Never   Tobacco comments:    1.5 ppd as of 12/2022  Vaping Use   Vaping status: Never Used  Substance and Sexual Activity  Alcohol use: No   Drug use: Yes    Types: Fentanyl    Comment: 01/03/2023   Sexual activity: Yes    Birth control/protection: Surgical, Post-menopausal    Comment: tubal  Other Topics Concern   Not on file  Social History Narrative   Not on file   Social Drivers of Health   Financial Resource Strain: Low Risk  (11/15/2022)   Overall Financial Resource Strain (CARDIA)    Difficulty of Paying Living Expenses: Not very hard  Food Insecurity: No Food Insecurity (11/15/2022)   Hunger Vital Sign    Worried About Running Out of Food in the Last Year: Never true    Ran Out of Food in the Last Year: Never true  Transportation Needs: No Transportation Needs (11/15/2022)   PRAPARE - Administrator, Civil Service (Medical): No    Lack of Transportation (Non-Medical): No  Physical Activity: Sufficiently  Active (11/15/2022)   Exercise Vital Sign    Days of Exercise per Week: 5 days    Minutes of Exercise per Session: 60 min  Stress: Stress Concern Present (11/15/2022)   [employer] of Occupational Health - Occupational Stress Questionnaire    Feeling of Stress : Very much  Social Connections: Moderately Isolated (11/15/2022)   Social Connection and Isolation Panel [NHANES]    Frequency of Communication with Friends and Family: More than three times a week    Frequency of Social Gatherings with Friends and Family: More than three times a week    Attends Religious Services: 1 to 4 times per year    Active Member of [employer] or Organizations: No    Attends Banker Meetings: Never    Marital Status: Separated    Current Medications:  Current Outpatient Medications:    hydrOXYzine (VISTARIL) 25 MG capsule, Take 1 capsule (25 mg total) by mouth every 8 (eight) hours as needed., Disp: 30 capsule, Rfl: 3   losartan (COZAAR) 25 MG tablet, Take 1 tablet (25 mg total) by mouth daily., Disp: 30 tablet, Rfl: 6  Review of Systems: + constipation Denies appetite changes, fevers, chills, fatigue, unexplained weight changes. Denies hearing loss, neck lumps or masses, mouth sores, ringing in ears or voice changes. Denies cough or wheezing.  Denies shortness of breath. Denies chest pain or palpitations. Denies leg swelling. Denies abdominal distention, pain, blood in stools, diarrhea, nausea, vomiting, or early satiety. Denies pain with intercourse, dysuria, frequency, hematuria or incontinence. Denies hot flashes, pelvic pain, vaginal bleeding or vaginal discharge.   Denies joint pain, back pain or muscle pain/cramps. Denies itching, rash, or wounds. Denies dizziness, headaches, numbness or seizures. Denies swollen lymph nodes or glands, denies easy bruising or bleeding. Denies anxiety, depression, confusion, or decreased concentration.  Physical Exam: BP (!) 157/102 (BP  Location: Left Arm, Patient Position: Sitting) Comment: Notified RN  Pulse 87   Temp 98.5 F (36.9 C) (Oral)   Resp 19   Wt 147 lb 3.2 oz (66.8 kg)   LMP 10/11/2014   SpO2 99%   BMI 25.27 kg/m  General: Alert, oriented, no acute distress. HEENT: Normocephalic, atraumatic, sclera anicteric. Chest: Unlabored breathing on room air. GU: Normal appearing external genitalia without erythema, excoriation, or lesions.  Incision has completely healed, no erythema, induration or exudate.  No areas of leukoplakia.  Laboratory & Radiologic Studies: A. 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       HSIL extends to  the inked mucosa margin at 3:00 and 11:00.       Negative for invasive carcinoma.   B. CENTRAL DEEP MARGIN:       Fibrovascular tissue, negative for dysplasia or malignancy.   Assessment & Plan: Jamie Evans is a 53 y.o. 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   Patient is doing very well from surgery.  Healed without any issues.  Discussed increasing bowel regimen until constipation improves.  Reviewed pathology in detail from surgery.  Positive margin at 11 and 3:00.  On exam today, no visible findings to suggest high-grade dysplasia at either location.  Discussed with patient options to either proceed with retreatment now (with reexcision or laser) versus close follow-up.  Her preference is to proceed with close follow-up.  I will see her in 6 months for vulvoscopy.  We discussed signs and symptoms that should prompt a phone call before her next scheduled visit.  20 minutes of total time was spent for this patient encounter, including preparation, face-to-face counseling with the patient and coordination of care, and documentation of the encounter.  Eugene Garnet, MD  Division of Gynecologic Oncology  Department of Obstetrics and Gynecology  [facility]

## 2023-02-19 ENCOUNTER — Ambulatory Visit
Admission: EM | Admit: 2023-02-19 | Discharge: 2023-02-19 | Disposition: A | Discharge status: Home / Self Care | Payer: Medicaid Other | Attending: Family Medicine | Admitting: Family Medicine

## 2023-02-19 ENCOUNTER — Encounter: Payer: Self-pay | Admitting: Internal Medicine

## 2023-02-19 DIAGNOSIS — R110 Nausea: Secondary | ICD-10-CM | POA: Diagnosis not present

## 2023-02-19 DIAGNOSIS — J069 Acute upper respiratory infection, unspecified: Secondary | ICD-10-CM

## 2023-02-19 LAB — POC COVID19/FLU A&B COMBO
Covid Antigen, POC: NEGATIVE
Influenza A Antigen, POC: NEGATIVE
Influenza B Antigen, POC: NEGATIVE

## 2023-02-19 MED ORDER — AZELASTINE HCL 0.1 % NA SOLN: 1.0000 | Freq: Two times a day (BID) | NASAL | 0 refills | Status: DC

## 2023-02-19 MED ORDER — ONDANSETRON 4 MG PO TBDP: 4.0000 mg | ORAL_TABLET | Freq: Three times a day (TID) | ORAL | 0 refills | Status: DC | PRN

## 2023-02-19 NOTE — ED Triage Notes (Signed)
Pt c/o nausea, diarrhea, fatigue, cough,  headache, chills that started yesterday. Taking Nyquil.

## 2023-02-19 NOTE — ED Provider Notes (Signed)
[facility]    CSN: [id number] Arrival date & time: 02/19/23  1441      History   Chief Complaint Chief Complaint  Patient presents with   Diarrhea   Headache    HPI Jamie Evans is a 53 y.o. female.   Presenting today with 1 day history of nausea, diarrhea, fatigue, cough, headaches, chills, runny nose.  Denies fever, chest pain, shortness of breath, abdominal pain, rashes.  So far trying NyQuil with minimal relief.  Multiple sick contacts recently.    Past Medical History:  Diagnosis Date   Anxiety 02/11/2013   Husband cusses her and the kids, has not hit her, will refer to [facility]   Gall bladder disease    gal stones   Hematuria 05/19/2014   resolved   Hypertension    Mental disorder    had 'nervous breakdown" 2005   Tobacco abuse    Urinary frequency 05/19/2014   UTI (urinary tract infection) 05/19/2014   Vulvar intraepithelial neoplasia (VIN) grade 3     Patient Active Problem List   Diagnosis Date Noted   Vulvar intraepithelial neoplasia (VIN) grade 3 01/04/2023   Trichomoniasis 11/21/2022   Elevated liver enzymes 11/16/2022   Screening cholesterol level 11/15/2022   Screening mammogram for breast cancer 11/15/2022   Screening for colorectal cancer 11/15/2022   Encounter for screening fecal occult blood testing 11/15/2022   Encounter for gynecological examination with Papanicolaou smear of cervix 11/15/2022   Routine general medical examination at a health care facility 11/15/2022   Dyspareunia in female 11/15/2022   Vaginal atrophy 11/15/2022   Vulval lesion 11/15/2022   Screening for thyroid disorder 11/15/2022   Anxiety and depression 11/15/2022   Chronic narcotic dependence (HCC) 10/31/2015   IV drug abuse 10/31/2015   Volume depletion 10/31/2015   Withdrawal from opioids (HCC) 10/31/2015   Nausea vomiting and diarrhea 10/31/2015   Abdominal pain 10/31/2015   Acute narcotic withdrawal without complication (HCC) 10/31/2015    Acute encephalopathy 10/05/2015   Hypokalemia 10/05/2015   Essential hypertension 10/05/2015   Substance abuse (HCC) 10/05/2015   UTI (urinary tract infection) 05/19/2014   Anxiety 02/11/2013    Past Surgical History:  Procedure Laterality Date   CESAREAN SECTION     4   TUBAL LIGATION     at time of last c-section   VULVA /PERINEUM BIOPSY N/A 01/04/2023   Procedure: VULVAR BIOPSY;  Surgeon: Carver Fila, MD;  Location: [facility];  Service: Gynecology;  Laterality: N/A;   VULVECTOMY N/A 01/04/2023   Procedure: WIDE EXCISION VULVECTOMY;  Surgeon: Carver Fila, MD;  Location: [facility];  Service: Gynecology;  Laterality: N/A;    OB History     Gravida  7   Para  5   Term      Preterm  1   AB  2   Living  4      SAB  2   IAB      Ectopic      Multiple      Live Births  4            Home Medications    Prior to Admission medications   Medication Sig Start Date End Date Taking? Authorizing Provider  azelastine (ASTELIN) 0.1 % nasal spray Place 1 spray into both nostrils 2 (two) times daily. Use in each nostril as directed 02/19/23  Yes Particia Nearing, PA-C  hydrOXYzine (VISTARIL) 25 MG capsule Take  1 capsule (25 mg total) by mouth every 8 (eight) hours as needed. 11/15/22  Yes Cyril Mourning A, NP  losartan (COZAAR) 25 MG tablet Take 1 tablet (25 mg total) by mouth daily. 11/15/22  Yes Adline Potter, NP  ondansetron (ZOFRAN-ODT) 4 MG disintegrating tablet Take 1 tablet (4 mg total) by mouth every 8 (eight) hours as needed for nausea or vomiting. 02/19/23  Yes Particia Nearing, PA-C    Family History Family History  Problem Relation Age of Onset   Diabetes Mother    Uterine cancer Mother    Lung cancer Mother    Heart disease Father    Stroke Father    Kidney disease Father    Hypertension Father    Hyperlipidemia Sister    Other Sister        brain tumors   Hypertension Sister     Cancer Maternal Aunt    COPD Maternal Aunt    Cancer Maternal Aunt    Hypertension Maternal Uncle    Heart disease Paternal Aunt    Cancer Paternal Aunt    COPD Maternal Grandmother    Diabetes Maternal Grandmother    Heart attack Paternal Grandmother    Gout Son    Colon cancer Neg Hx    Breast cancer Neg Hx    Ovarian cancer Neg Hx    Prostate cancer Neg Hx    Pancreatic cancer Neg Hx     Social History Social History   Tobacco Use   Smoking status: Every Day    Current packs/day: 2.00    Average packs/day: 2.0 packs/day for 31.0 years (62.0 ttl pk-yrs)    Types: Cigarettes   Smokeless tobacco: Never   Tobacco comments:    1.5 ppd as of 12/2022  Vaping Use   Vaping status: Never Used  Substance Use Topics   Alcohol use: No   Drug use: Not Currently    Types: Fentanyl    Comment: 01/03/2023     Allergies   Hydrocodone and Penicillins   Review of Systems Review of Systems Per HPI  Physical Exam Triage Vital Signs ED Triage Vitals  Encounter Vitals Group     BP 02/19/23 1527 (!) 143/101     Systolic BP Percentile --      Diastolic BP Percentile --      Pulse Rate 02/19/23 1527 89     Resp 02/19/23 1527 18     Temp 02/19/23 1527 97.9 F (36.6 C)     Temp Source 02/19/23 1527 Oral     SpO2 02/19/23 1527 96 %     Weight --      Height --      Head Circumference --      Peak Flow --      Pain Score 02/19/23 1528 0     Pain Loc --      Pain Education --      Exclude from Growth Chart --    No data found.  Updated Vital Signs BP (!) 143/101 (BP Location: Right Arm)   Pulse 89   Temp 97.9 F (36.6 C) (Oral)   Resp 18   LMP 10/11/2014   SpO2 96%   Visual Acuity Right Eye Distance:   Left Eye Distance:   Bilateral Distance:    Right Eye Near:   Left Eye Near:    Bilateral Near:     Physical Exam Vitals and nursing note reviewed.  Constitutional:      Appearance:  Normal appearance.  HENT:     Head: Atraumatic.     Right Ear: Tympanic  membrane and external ear normal.     Left Ear: Tympanic membrane and external ear normal.     Nose: Congestion present.     Mouth/Throat:     Mouth: Mucous membranes are moist.     Pharynx: Posterior oropharyngeal erythema present.  Eyes:     Extraocular Movements: Extraocular movements intact.     Conjunctiva/sclera: Conjunctivae normal.  Cardiovascular:     Rate and Rhythm: Normal rate and regular rhythm.     Heart sounds: Normal heart sounds.  Pulmonary:     Effort: Pulmonary effort is normal.     Breath sounds: Normal breath sounds. No wheezing.  Abdominal:     General: Bowel sounds are normal. There is no distension.     Palpations: Abdomen is soft.     Tenderness: There is no abdominal tenderness. There is no right CVA tenderness, left CVA tenderness or guarding.  Musculoskeletal:        General: Normal range of motion.     Cervical back: Normal range of motion and neck supple.  Skin:    General: Skin is warm and dry.  Neurological:     Mental Status: She is alert and oriented to person, place, and time.  Psychiatric:        Mood and Affect: Mood normal.        Thought Content: Thought content normal.      UC Treatments / Results  Labs (all labs ordered are listed, but only abnormal results are displayed) Labs Reviewed  POC COVID19/FLU A&B COMBO    EKG   Radiology No results found.  Procedures Procedures (including critical care time)  Medications Ordered in UC Medications - No data to display  Initial Impression / Assessment and Plan / UC Course  I have reviewed the triage vital signs and the nursing notes.  Pertinent labs & imaging results that were available during my care of the patient were reviewed by me and considered in my medical decision making (see chart for details).     Vitals and exam overall reassuring today, suspicious for viral respiratory infection.  Rapid flu and COVID-negative, treat with Zofran, Astelin, supportive  over-the-counter medications and home care.  Work note given.  Return for worsening symptoms.  Final Clinical Impressions(s) / UC Diagnoses   Final diagnoses:  Viral URI  Nausea without vomiting   Discharge Instructions   None    ED Prescriptions     Medication Sig Dispense Auth. Provider   ondansetron (ZOFRAN-ODT) 4 MG disintegrating tablet Take 1 tablet (4 mg total) by mouth every 8 (eight) hours as needed for nausea or vomiting. 20 tablet Particia Nearing, [place]   azelastine (ASTELIN) 0.1 % nasal spray Place 1 spray into both nostrils 2 (two) times daily. Use in each nostril as directed 30 mL Particia Nearing, PA-C      PDMP not reviewed this encounter.   Particia Nearing, [place] 02/19/23 1635

## 2023-03-14 ENCOUNTER — Ambulatory Visit: Payer: Self-pay | Admitting: Internal Medicine

## 2023-03-20 ENCOUNTER — Encounter: Payer: Self-pay | Admitting: Internal Medicine

## 2023-03-29 ENCOUNTER — Other Ambulatory Visit: Payer: Self-pay

## 2023-03-29 ENCOUNTER — Ambulatory Visit
Admission: EM | Admit: 2023-03-29 | Discharge: 2023-03-29 | Disposition: A | Discharge status: Home / Self Care | Payer: Medicaid Other | Attending: Family Medicine | Admitting: Family Medicine

## 2023-03-29 ENCOUNTER — Encounter: Payer: Self-pay | Admitting: Emergency Medicine

## 2023-03-29 DIAGNOSIS — J101 Influenza due to other identified influenza virus with other respiratory manifestations: Secondary | ICD-10-CM

## 2023-03-29 DIAGNOSIS — R030 Elevated blood-pressure reading, without diagnosis of hypertension: Secondary | ICD-10-CM | POA: Diagnosis not present

## 2023-03-29 LAB — POC COVID19/FLU A&B COMBO
Covid Antigen, POC: NEGATIVE
Influenza A Antigen, POC: POSITIVE — AB
Influenza B Antigen, POC: NEGATIVE

## 2023-03-29 MED ORDER — OSELTAMIVIR PHOSPHATE 75 MG PO CAPS: 75.0000 mg | ORAL_CAPSULE | Freq: Two times a day (BID) | ORAL | 0 refills | Status: DC

## 2023-03-29 MED ORDER — ONDANSETRON 4 MG PO TBDP: 4.0000 mg | ORAL_TABLET | Freq: Three times a day (TID) | ORAL | 0 refills | Status: DC | PRN

## 2023-03-29 MED ORDER — BENZONATATE 200 MG PO CAPS: 200.0000 mg | ORAL_CAPSULE | Freq: Three times a day (TID) | ORAL | 0 refills | Status: DC | PRN

## 2023-03-29 NOTE — ED Triage Notes (Signed)
 Pt reports sore throat, cough, emesis, weakness since Monday. Reports intermittent fever.
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
## 2023-03-29 NOTE — Discharge Instructions (Signed)
 In addition to the prescribed medications, you may take Coricidin HBP, plain Mucinex, Flonase nasal spray, Tylenol

## 2023-04-02 ENCOUNTER — Ambulatory Visit
Admission: RE | Admit: 2023-04-02 | Discharge: 2023-04-02 | Disposition: A | Discharge status: Home / Self Care | Payer: Self-pay | Source: Ambulatory Visit | Attending: Family Medicine | Admitting: Family Medicine

## 2023-04-02 VITALS — BP 186/112 | HR 69 | Temp 97.9°F | Resp 18

## 2023-04-02 DIAGNOSIS — J101 Influenza due to other identified influenza virus with other respiratory manifestations: Secondary | ICD-10-CM | POA: Diagnosis not present

## 2023-04-02 MED ORDER — LIDOCAINE VISCOUS HCL 2 % MT SOLN: 10.0000 mL | OROMUCOSAL | 0 refills | Status: DC | PRN

## 2023-04-02 MED ORDER — FLUTICASONE PROPIONATE 50 MCG/ACT NA SUSP: 1.0000 | Freq: Two times a day (BID) | NASAL | 2 refills | Status: DC
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
## 2023-04-02 NOTE — ED Triage Notes (Signed)
 Cough, sore throat, fatigue x 1 week. Pt was seen 03/29/23 and was positive for flu A, pt states she took her prescribed medication and is still not feeling better.

## 2023-08-16 ENCOUNTER — Telehealth: Payer: Self-pay

## 2023-08-16 NOTE — Telephone Encounter (Signed)
 Spoke with patient and she needed to reschedule her appointment from 7/18 to 9/11 bc her daughter is out of town and can't be here to bring her.  Per patient we rescheduled to 9/11 at 2:30pm..

## 2023-08-17 ENCOUNTER — Inpatient Hospital Stay: Payer: Medicaid Other | Admitting: Gynecologic Oncology

## 2023-08-17 DIAGNOSIS — D071 Carcinoma in situ of vulva: Secondary | ICD-10-CM

## 2023-09-17 ENCOUNTER — Ambulatory Visit
Admission: EM | Admit: 2023-09-17 | Discharge: 2023-09-17 | Disposition: A | Discharge status: Home / Self Care | Payer: Self-pay | Attending: Family Medicine | Admitting: Family Medicine

## 2023-09-17 DIAGNOSIS — J069 Acute upper respiratory infection, unspecified: Secondary | ICD-10-CM | POA: Diagnosis not present

## 2023-09-17 LAB — POC COVID19/FLU A&B COMBO
Covid Antigen, POC: NEGATIVE
Influenza A Antigen, POC: NEGATIVE
Influenza B Antigen, POC: NEGATIVE

## 2023-09-17 MED ORDER — AZELASTINE HCL 0.1 % NA SOLN: 1.0000 | Freq: Two times a day (BID) | NASAL | 0 refills | Status: DC

## 2023-09-17 MED ORDER — PROMETHAZINE-DM 6.25-15 MG/5ML PO SYRP: 5.0000 mL | ORAL_SOLUTION | Freq: Four times a day (QID) | ORAL | 0 refills | Status: DC | PRN
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
## 2023-09-17 NOTE — ED Triage Notes (Signed)
 Body aches, fatigue, diarrhea, headache x 3 days. Taking mucinex.   Took a home coivd test and it was negative.

## 2023-09-20 ENCOUNTER — Emergency Department (HOSPITAL_COMMUNITY): Payer: Self-pay

## 2023-09-20 ENCOUNTER — Encounter (HOSPITAL_COMMUNITY): Payer: Self-pay

## 2023-09-20 ENCOUNTER — Inpatient Hospital Stay (HOSPITAL_COMMUNITY)
Admission: EM | Admit: 2023-09-20 | Discharge: 2023-09-25 | DRG: 549 | Disposition: A | Discharge status: Home / Self Care | Payer: Self-pay | Attending: Internal Medicine | Admitting: Internal Medicine

## 2023-09-20 ENCOUNTER — Other Ambulatory Visit: Payer: Self-pay

## 2023-09-20 DIAGNOSIS — M4658 Other infective spondylopathies, sacral and sacrococcygeal region: Secondary | ICD-10-CM

## 2023-09-20 DIAGNOSIS — Z825 Family history of asthma and other chronic lower respiratory diseases: Secondary | ICD-10-CM

## 2023-09-20 DIAGNOSIS — Z823 Family history of stroke: Secondary | ICD-10-CM

## 2023-09-20 DIAGNOSIS — Z841 Family history of disorders of kidney and ureter: Secondary | ICD-10-CM

## 2023-09-20 DIAGNOSIS — Z79899 Other long term (current) drug therapy: Secondary | ICD-10-CM

## 2023-09-20 DIAGNOSIS — R112 Nausea with vomiting, unspecified: Secondary | ICD-10-CM | POA: Diagnosis not present

## 2023-09-20 DIAGNOSIS — Z8049 Family history of malignant neoplasm of other genital organs: Secondary | ICD-10-CM

## 2023-09-20 DIAGNOSIS — E876 Hypokalemia: Secondary | ICD-10-CM | POA: Diagnosis present

## 2023-09-20 DIAGNOSIS — Z98891 History of uterine scar from previous surgery: Secondary | ICD-10-CM

## 2023-09-20 DIAGNOSIS — F419 Anxiety disorder, unspecified: Secondary | ICD-10-CM | POA: Diagnosis present

## 2023-09-20 DIAGNOSIS — I10 Essential (primary) hypertension: Secondary | ICD-10-CM | POA: Diagnosis present

## 2023-09-20 DIAGNOSIS — F1910 Other psychoactive substance abuse, uncomplicated: Secondary | ICD-10-CM

## 2023-09-20 DIAGNOSIS — F1110 Opioid abuse, uncomplicated: Secondary | ICD-10-CM | POA: Diagnosis present

## 2023-09-20 DIAGNOSIS — Z87412 Personal history of vulvar dysplasia: Secondary | ICD-10-CM

## 2023-09-20 DIAGNOSIS — B9689 Other specified bacterial agents as the cause of diseases classified elsewhere: Secondary | ICD-10-CM | POA: Diagnosis present

## 2023-09-20 DIAGNOSIS — Z880 Allergy status to penicillin: Secondary | ICD-10-CM

## 2023-09-20 DIAGNOSIS — F17210 Nicotine dependence, cigarettes, uncomplicated: Secondary | ICD-10-CM | POA: Diagnosis present

## 2023-09-20 DIAGNOSIS — Z885 Allergy status to narcotic agent status: Secondary | ICD-10-CM

## 2023-09-20 DIAGNOSIS — F1990 Other psychoactive substance use, unspecified, uncomplicated: Secondary | ICD-10-CM | POA: Insufficient documentation

## 2023-09-20 DIAGNOSIS — E860 Dehydration: Secondary | ICD-10-CM | POA: Diagnosis present

## 2023-09-20 DIAGNOSIS — Z8249 Family history of ischemic heart disease and other diseases of the circulatory system: Secondary | ICD-10-CM

## 2023-09-20 DIAGNOSIS — M609 Myositis, unspecified: Secondary | ICD-10-CM | POA: Diagnosis present

## 2023-09-20 DIAGNOSIS — M009 Pyogenic arthritis, unspecified: Principal | ICD-10-CM | POA: Diagnosis present

## 2023-09-20 DIAGNOSIS — F32A Depression, unspecified: Secondary | ICD-10-CM | POA: Diagnosis present

## 2023-09-20 DIAGNOSIS — Z83438 Family history of other disorder of lipoprotein metabolism and other lipidemia: Secondary | ICD-10-CM

## 2023-09-20 DIAGNOSIS — Z801 Family history of malignant neoplasm of trachea, bronchus and lung: Secondary | ICD-10-CM

## 2023-09-20 DIAGNOSIS — R7881 Bacteremia: Secondary | ICD-10-CM | POA: Diagnosis present

## 2023-09-20 DIAGNOSIS — M6008 Infective myositis, other site: Secondary | ICD-10-CM | POA: Diagnosis present

## 2023-09-20 DIAGNOSIS — Z720 Tobacco use: Secondary | ICD-10-CM | POA: Diagnosis present

## 2023-09-20 DIAGNOSIS — Z833 Family history of diabetes mellitus: Secondary | ICD-10-CM

## 2023-09-20 DIAGNOSIS — M549 Dorsalgia, unspecified: Principal | ICD-10-CM | POA: Diagnosis present

## 2023-09-20 HISTORY — DX: Dorsalgia, unspecified: M54.9

## 2023-09-20 LAB — CBC WITH DIFFERENTIAL/PLATELET
Abs Granulocyte: 6 K/uL (ref 1.5–6.5)
Abs Immature Granulocytes: 0.05 K/uL (ref 0.00–0.07)
Basophils Absolute: 0 K/uL (ref 0.0–0.1)
Basophils Relative: 0 %
Eosinophils Absolute: 0.1 K/uL (ref 0.0–0.5)
Eosinophils Relative: 1 %
HCT: 35.6 % — ABNORMAL LOW (ref 36.0–46.0)
Hemoglobin: 12.2 g/dL (ref 12.0–15.0)
Immature Granulocytes: 1 %
Lymphocytes Relative: 11 %
Lymphs Abs: 0.8 K/uL (ref 0.7–4.0)
MCH: 29.2 pg (ref 26.0–34.0)
MCHC: 34.3 g/dL (ref 30.0–36.0)
MCV: 85.2 fL (ref 80.0–100.0)
Monocytes Absolute: 0.5 K/uL (ref 0.1–1.0)
Monocytes Relative: 7 %
Neutro Abs: 6 K/uL (ref 1.7–7.7)
Neutrophils Relative %: 80 %
Platelets: 132 K/uL — ABNORMAL LOW (ref 150–400)
RBC: 4.18 MIL/uL (ref 3.87–5.11)
RDW: 13.3 % (ref 11.5–15.5)
WBC: 7.5 K/uL (ref 4.0–10.5)
nRBC: 0 % (ref 0.0–0.2)

## 2023-09-20 LAB — COMPREHENSIVE METABOLIC PANEL WITH GFR
ALT: 24 U/L (ref 0–44)
AST: 27 U/L (ref 15–41)
Albumin: 2.8 g/dL — ABNORMAL LOW (ref 3.5–5.0)
Alkaline Phosphatase: 173 U/L — ABNORMAL HIGH (ref 38–126)
Anion gap: 14 (ref 5–15)
BUN: 11 mg/dL (ref 6–20)
CO2: 30 mmol/L (ref 22–32)
Calcium: 8.8 mg/dL — ABNORMAL LOW (ref 8.9–10.3)
Chloride: 97 mmol/L — ABNORMAL LOW (ref 98–111)
Creatinine, Ser: 0.81 mg/dL (ref 0.44–1.00)
GFR, Estimated: >60 mL/min (ref >60)
Glucose, Bld: 109 mg/dL — ABNORMAL HIGH (ref 70–99)
Potassium: 3.2 mmol/L — ABNORMAL LOW (ref 3.5–5.1)
Sodium: 141 mmol/L (ref 135–145)
Total Bilirubin: 0.9 mg/dL (ref 0.0–1.2)
Total Protein: 7 g/dL (ref 6.5–8.1)

## 2023-09-20 LAB — LACTIC ACID, PLASMA: Lactic Acid, Venous: 1.2 mmol/L (ref 0.5–1.9)

## 2023-09-20 MED ORDER — KETOROLAC TROMETHAMINE 15 MG/ML IJ SOLN
15.0000 mg | Freq: Once | INTRAMUSCULAR | Status: AC
  Administered 2023-09-20: 15 mg via INTRAVENOUS
  Filled 2023-09-20: qty 1

## 2023-09-20 MED ORDER — ENOXAPARIN SODIUM 40 MG/0.4ML IJ SOSY
40.0000 mg | PREFILLED_SYRINGE | INTRAMUSCULAR | Status: DC
  Administered 2023-09-20 – 2023-09-24 (×5): 40 mg via SUBCUTANEOUS
  Filled 2023-09-20 (×5): qty 0.4

## 2023-09-20 MED ORDER — POTASSIUM CHLORIDE CRYS ER 20 MEQ PO TBCR
40.0000 meq | EXTENDED_RELEASE_TABLET | Freq: Once | ORAL | Status: AC
  Administered 2023-09-20: 40 meq via ORAL
  Filled 2023-09-20: qty 2

## 2023-09-20 MED ORDER — LACTATED RINGERS IV SOLN: INTRAVENOUS | Status: DC

## 2023-09-20 MED ORDER — HYDROMORPHONE HCL 1 MG/ML IJ SOLN
1.0000 mg | INTRAMUSCULAR | Status: DC | PRN
  Administered 2023-09-20: 1 mg via INTRAVENOUS
  Filled 2023-09-20: qty 1

## 2023-09-20 MED ORDER — ONDANSETRON HCL 4 MG PO TABS
4.0000 mg | ORAL_TABLET | Freq: Four times a day (QID) | ORAL | Status: DC | PRN
  Administered 2023-09-25: 4 mg via ORAL
  Filled 2023-09-20: qty 1

## 2023-09-20 MED ORDER — HYDROMORPHONE HCL 1 MG/ML IJ SOLN
1.0000 mg | Freq: Once | INTRAMUSCULAR | Status: AC
  Administered 2023-09-20: 1 mg via INTRAVENOUS
  Filled 2023-09-20: qty 1

## 2023-09-20 MED ORDER — IBUPROFEN 600 MG PO TABS
600.0000 mg | ORAL_TABLET | Freq: Three times a day (TID) | ORAL | Status: DC
  Administered 2023-09-20 – 2023-09-21 (×2): 600 mg via ORAL
  Filled 2023-09-20 (×2): qty 1

## 2023-09-20 MED ORDER — POTASSIUM CHLORIDE 20 MEQ PO PACK
40.0000 meq | PACK | ORAL | Status: AC
  Administered 2023-09-20 – 2023-09-21 (×2): 40 meq via ORAL
  Filled 2023-09-20 (×2): qty 2

## 2023-09-20 MED ORDER — GADOBUTROL 1 MMOL/ML IV SOLN
6.0000 mL | Freq: Once | INTRAVENOUS | Status: AC | PRN
  Administered 2023-09-20: 6 mL via INTRAVENOUS

## 2023-09-20 MED ORDER — NICOTINE 14 MG/24HR TD PT24
14.0000 mg | MEDICATED_PATCH | Freq: Every day | TRANSDERMAL | Status: DC
  Administered 2023-09-20 – 2023-09-21 (×2): 14 mg via TRANSDERMAL
  Filled 2023-09-20 (×2): qty 1

## 2023-09-20 MED ORDER — HYDROMORPHONE HCL 1 MG/ML IJ SOLN: 2.0000 mg | INTRAMUSCULAR | Status: DC | PRN

## 2023-09-20 MED ORDER — ACETAMINOPHEN 325 MG PO TABS
650.0000 mg | ORAL_TABLET | Freq: Four times a day (QID) | ORAL | Status: DC | PRN
  Administered 2023-09-21 – 2023-09-24 (×2): 650 mg via ORAL
  Filled 2023-09-20 (×2): qty 2

## 2023-09-20 MED ORDER — HYDROMORPHONE HCL 1 MG/ML IJ SOLN
2.0000 mg | Freq: Once | INTRAMUSCULAR | Status: AC
  Administered 2023-09-20: 2 mg via INTRAVENOUS
  Filled 2023-09-20: qty 2

## 2023-09-20 MED ORDER — POLYETHYLENE GLYCOL 3350 17 G PO PACK
17.0000 g | PACK | Freq: Every day | ORAL | Status: DC | PRN
  Administered 2023-09-24: 17 g via ORAL
  Filled 2023-09-20: qty 1

## 2023-09-20 MED ORDER — ACETAMINOPHEN 650 MG RE SUPP: 650.0000 mg | Freq: Four times a day (QID) | RECTAL | Status: DC | PRN

## 2023-09-20 MED ORDER — LACTATED RINGERS IV SOLN: INTRAVENOUS | Status: AC

## 2023-09-20 MED ORDER — ONDANSETRON HCL 4 MG/2ML IJ SOLN
4.0000 mg | Freq: Four times a day (QID) | INTRAMUSCULAR | Status: DC | PRN
  Administered 2023-09-22 – 2023-09-24 (×2): 4 mg via INTRAVENOUS
  Filled 2023-09-20 (×2): qty 2

## 2023-09-20 MED ORDER — ONDANSETRON HCL 4 MG/2ML IJ SOLN
4.0000 mg | Freq: Once | INTRAMUSCULAR | Status: AC
  Administered 2023-09-20: 4 mg via INTRAVENOUS
  Filled 2023-09-20: qty 2

## 2023-09-20 MED ORDER — LACTATED RINGERS IV BOLUS
1000.0000 mL | Freq: Once | INTRAVENOUS | Status: AC
  Administered 2023-09-20: 1000 mL via INTRAVENOUS

## 2023-09-20 MED ORDER — DROPERIDOL 2.5 MG/ML IJ SOLN
1.2500 mg | Freq: Once | INTRAMUSCULAR | Status: AC
  Administered 2023-09-20: 1.25 mg via INTRAVENOUS
  Filled 2023-09-20: qty 2

## 2023-09-20 NOTE — ED Notes (Signed)
 Both sets of blood cultures obtained before any ABX administration

## 2023-09-20 NOTE — ED Triage Notes (Addendum)
 Pt reports right side back pain radiating down her right leg since Friday.  Pt reports she has tried to work but she can not walk.  Pt reports she uses fentanyl  for recreational drug use and used this morning.

## 2023-09-20 NOTE — Assessment & Plan Note (Signed)
Potassium 3.2. - replete
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
## 2023-09-20 NOTE — Assessment & Plan Note (Signed)
 IV fentanyl  drug use.  Last use today.

## 2023-09-20 NOTE — Assessment & Plan Note (Signed)
 1 to 2 pack/day -Nicotine  patch

## 2023-09-20 NOTE — ED Provider Notes (Signed)
 [place] EMERGENCY DEPARTMENT AT [facility] Provider Note   CSN: [id number] Arrival date & time: 09/20/23  1300     Patient presents with: Sciatica   Jamie Evans is a 53 y.o. female.  History of IV fentanyl  abuse, hypertension.  She presents the ER today for evaluation of severe low back pain radiating down the right leg that started 6 days ago and gradually worsening.  She been trying to work but today is not able to walk because of the severe pain.  Last night she had chills and folic she had a fever.  She went to urgent care a few days ago due to cough and congestion and chills and was diagnosed with a viral URI.  She is present here with her daughter and boyfriend today, they are concerned about possible infection in her back due to the IV fentanyl  use.  Is worse with movement and standing, no alleviating factors.   HPI     Prior to Admission medications   Medication Sig Start Date End Date Taking? Authorizing Provider  ibuprofen  (ADVIL ) 200 MG tablet Take 600 mg by mouth every 6 (six) hours as needed for mild pain (pain score 1-3).   Yes [provider]  losartan  (COZAAR ) 25 MG tablet Take 1 tablet (25 mg total) by mouth daily. 11/15/22  Yes Signa Delon LABOR, NP    Allergies: Hydrocodone and Penicillins    Review of Systems  Updated Vital Signs BP 130/81   Pulse 85   Temp 98.4 F (36.9 C) (Temporal)   Resp 18   Ht 5' 4 (1.626 m)   Wt 60.3 kg   LMP 10/11/2014   SpO2 97%   BMI 22.83 kg/m   Physical Exam Vitals and nursing note reviewed.  Constitutional:      Appearance: She is well-developed. She is not diaphoretic.     Comments: Patient is obvious in severe pain, holding her right lower back, holding her right hip flexed and rocking back and forth.  HENT:     Head: Normocephalic and atraumatic.     Mouth/Throat:     Mouth: Mucous membranes are moist.  Eyes:     Extraocular Movements: Extraocular movements intact.      Conjunctiva/sclera: Conjunctivae normal.     Pupils: Pupils are equal, round, and reactive to light.  Cardiovascular:     Rate and Rhythm: Normal rate and regular rhythm.     Heart sounds: No murmur heard. Pulmonary:     Effort: Pulmonary effort is normal. No respiratory distress.     Breath sounds: Normal breath sounds.  Abdominal:     Palpations: Abdomen is soft.     Tenderness: There is no abdominal tenderness.  Musculoskeletal:        General: No swelling.     Cervical back: Neck supple.     Comments: There is tenderness to the right lumbar and sacroiliac area that reproduces pain with no overlying redness or warmth.  Skin:    General: Skin is warm and dry.     Capillary Refill: Capillary refill takes less than 2 seconds.     Comments: There are necrotic areas to the dorsal bilateral forearms with multiple open areas with mild erythema and no significant drainage  Neurological:     General: No focal deficit present.     Mental Status: She is oriented to person, place, and time.  Psychiatric:        Mood and Affect: Mood normal.        (  all labs ordered are listed, but only abnormal results are displayed) Labs Reviewed  CBC WITH DIFFERENTIAL/PLATELET - Abnormal; Notable for the following components:      Result Value   HCT 35.6 (*)    Platelets 132 (*)    All other components within normal limits  COMPREHENSIVE METABOLIC PANEL WITH GFR - Abnormal; Notable for the following components:   Potassium 3.2 (*)    Chloride 97 (*)    Glucose, Bld 109 (*)    Calcium  8.8 (*)    Albumin 2.8 (*)    Alkaline Phosphatase 173 (*)    All other components within normal limits  CULTURE, BLOOD (ROUTINE X 2)  CULTURE, BLOOD (ROUTINE X 2)  LACTIC ACID, PLASMA  URINALYSIS, ROUTINE W REFLEX MICROSCOPIC    EKG: None  Radiology: MR THORACIC SPINE W WO CONTRAST Result Date: 09/20/2023 CLINICAL DATA:  Back pain.  Intravenous drug use. EXAM: MRI THORACIC AND LUMBAR SPINE WITHOUT AND  WITH CONTRAST TECHNIQUE: Multiplanar and multiecho pulse sequences of the thoracic and lumbar spine were obtained without and with intravenous contrast. CONTRAST:  6mL GADAVIST  GADOBUTROL  1 MMOL/ML IV SOLN COMPARISON:  07/08/2013 FINDINGS: MRI THORACIC SPINE FINDINGS Alignment:  No malalignment. Vertebrae: Benign appearing hemangioma within the T7 vertebral body. No other finding. No evidence of bone infection. Cord:  Normal Paraspinal and other soft tissues: Normal Disc levels: No significant disc level finding. No stenosis of the canal or foramina. Minimal non-compressive disc bulges at T8-9 and T10-11, not likely of any significance. No evidence of facet arthropathy. MRI LUMBAR SPINE FINDINGS Segmentation:  5 lumbar type vertebral bodies. Alignment:  Normal Vertebrae: No fracture or focal bone lesion of significance. Insignificant small a meningioma within the L1 vertebral body. Conus medullaris: Extends to the L1-2 level and appears normal. No worrisome finding. Paraspinal and other soft tissues: Prominent bile duct and pancreatic duct. This may relate to previous cholecystectomy, but is not primarily or completely evaluated. Question if there is some edema and enhancement at the right ileo psoas junction. This could indicate myositis. If there is concern regarding this possibility, consider pelvic MRI with and without contrast. Disc levels: No significant disc level finding. No sign of spinal infection. Minimal non-compressive disc bulge L3-4. No sign of septic facet arthropathy. IMPRESSION: 1. No evidence of spinal infection in the thoracic or lumbar region. 2. Question if there is some edema and enhancement at the right ileo psoas muscular junction. This could indicate myositis. If there is concern regarding this possibility, consider pelvic MRI with and without contrast. I do not think that the finding is definite based on the limited evaluation provided by this exam. a 3. Prominent bile duct and pancreatic  duct. This may relate to previous cholecystectomy, but is not primarily or completely evaluated. Electronically Signed   By: Oneil Officer M.D.   On: 09/20/2023 16:37   MR Lumbar Spine W Wo Contrast Result Date: 09/20/2023 CLINICAL DATA:  Back pain.  Intravenous drug use. EXAM: MRI THORACIC AND LUMBAR SPINE WITHOUT AND WITH CONTRAST TECHNIQUE: Multiplanar and multiecho pulse sequences of the thoracic and lumbar spine were obtained without and with intravenous contrast. CONTRAST:  6mL GADAVIST  GADOBUTROL  1 MMOL/ML IV SOLN COMPARISON:  07/08/2013 FINDINGS: MRI THORACIC SPINE FINDINGS Alignment:  No malalignment. Vertebrae: Benign appearing hemangioma within the T7 vertebral body. No other finding. No evidence of bone infection. Cord:  Normal Paraspinal and other soft tissues: Normal Disc levels: No significant disc level finding. No stenosis of the canal or  foramina. Minimal non-compressive disc bulges at T8-9 and T10-11, not likely of any significance. No evidence of facet arthropathy. MRI LUMBAR SPINE FINDINGS Segmentation:  5 lumbar type vertebral bodies. Alignment:  Normal Vertebrae: No fracture or focal bone lesion of significance. Insignificant small a meningioma within the L1 vertebral body. Conus medullaris: Extends to the L1-2 level and appears normal. No worrisome finding. Paraspinal and other soft tissues: Prominent bile duct and pancreatic duct. This may relate to previous cholecystectomy, but is not primarily or completely evaluated. Question if there is some edema and enhancement at the right ileo psoas junction. This could indicate myositis. If there is concern regarding this possibility, consider pelvic MRI with and without contrast. Disc levels: No significant disc level finding. No sign of spinal infection. Minimal non-compressive disc bulge L3-4. No sign of septic facet arthropathy. IMPRESSION: 1. No evidence of spinal infection in the thoracic or lumbar region. 2. Question if there is some edema  and enhancement at the right ileo psoas muscular junction. This could indicate myositis. If there is concern regarding this possibility, consider pelvic MRI with and without contrast. I do not think that the finding is definite based on the limited evaluation provided by this exam. a 3. Prominent bile duct and pancreatic duct. This may relate to previous cholecystectomy, but is not primarily or completely evaluated. Electronically Signed   By: Oneil Officer M.D.   On: 09/20/2023 16:37     Procedures   Medications Ordered in the ED  droperidol  (INAPSINE ) 2.5 MG/ML injection 1.25 mg (has no administration in time range)  lactated ringers  bolus 1,000 mL (has no administration in time range)  potassium chloride  SA (KLOR-CON  M) CR tablet 40 mEq (has no administration in time range)  HYDROmorphone  (DILAUDID ) injection 1 mg (1 mg Intravenous Given 09/20/23 1406)  ondansetron  (ZOFRAN ) injection 4 mg (4 mg Intravenous Given 09/20/23 1404)  ketorolac  (TORADOL ) 15 MG/ML injection 15 mg (15 mg Intravenous Given 09/20/23 1451)  HYDROmorphone  (DILAUDID ) injection 1 mg (1 mg Intravenous Given 09/20/23 1452)  gadobutrol  (GADAVIST ) 1 MMOL/ML injection 6 mL (6 mLs Intravenous Contrast Given 09/20/23 1618)  HYDROmorphone  (DILAUDID ) injection 2 mg (2 mg Intravenous Given 09/20/23 1623)                                    Medical Decision Making This patient presents to the ED for concern of diffuse low back pain radiating down the right leg, this involves an extensive number of treatment options, and is a complaint that carries with it a high risk of complications and morbidity.  The differential diagnosis includes HNP, epidural abscess, paraspinous abscess, muscle strain, kidney stone, pyelonephritis, other   Co morbidities that complicate the patient evaluation :   IVDA   Additional history obtained:  Additional history obtained from EMR External records from outside source obtained and reviewed including prior  notes and labs   Lab Tests:  I Ordered, and personally interpreted labs.  The pertinent results include: CBC no leukocytosis or anemia, lactic acid normal, CMP with potassium 3.2, mildly elevated alk phos otherwise reassuring   Imaging Studies ordered:  I ordered imaging studies including MRI thoracic and lumbar spine with and without contrast which shows normal disc bulge L3-L4 minimal spinal infection I independently visualized and interpreted imaging within scope of identifying emergent findings  I agree with the radiologist interpretation-they were able to note that patient has some possible edema  and enhancement to the right iliopsoas muscle junction which could indicate myositis I recommended pelvic MRI    Consultations Obtained:  I requested consultation with the hospitalist,  and discussed lab and imaging findings as well as pertinent plan - they recommend: Admission for pain control and MRI when patient can receive contrast after 24 hours of pelvis to evaluate for possible iliopsoas myositis   Problem List / ED Course / Critical interventions / Medication management  Patient having severe right low back pain radiating down the right leg for the past 6 days.  Patient does have prior risk factor of IV fentanyl  use.  She is initially tachycardic, lactate was normal, no leukocytosis.  Patient does not meet sepsis criteria.  Labs ordered for concern of epidural or paraspinous abscess and there is no sign of spinal infection but there is question of possibly some edema and enhancement in the right iliopsoas muscle junction.  Radiology recommended MRI with and without contrast of the pelvis but patient cannot have contrast for 24 hours per policy.  Patient pain is finally under control after a total of 4 mg of Dilaudid  and Toradol .  She is resting comfortably, willing to be admitted for further evaluation of possible myositis.  She did also have some chronic wounds to her forearms where she  injects fentanyl .  These appear to be chronically scarred and open but not necessarily actively infected.  Discussed with hospitalist Dr. Pearlean who will admit I ordered medication as above Reevaluation of the patient after these medicines showed that the patient improved I have reviewed the patients home medicines and have made adjustments as needed   Social Determinants of Health:  Patient uses IV fentanyl  daily    Amount and/or Complexity of Data Reviewed Labs: ordered. Radiology: ordered.  Risk Prescription drug management. Decision regarding hospitalization.        Final diagnoses:  None    ED Discharge Orders     None          Suellen Sherran DELENA DEVONNA 09/20/23 RONOLD Dean Clarity, MD 09/22/23 [phone]

## 2023-09-20 NOTE — Assessment & Plan Note (Addendum)
 MRI thoracic and lumbar spine unremarkable, but suggests myositis to right iliopsoas muscle.MRI pelvis recommended and ordered unfortunately patient cannot receive contrast within 24 hours.  Patient in severe pain, screaming, unable to ambulate.  IV fentanyl  drug abuser. - IV Dilaudid  2 mg every 4 hours as needed - Continuous pulse ox -Ibuprofen  600 3 times daily x 3 -Appears dehydrated, 1 L bolus given, cont LR 100cc/hr x 10hrs - MRI pelvis ordered for tomorrow

## 2023-09-21 ENCOUNTER — Inpatient Hospital Stay (HOSPITAL_COMMUNITY): Payer: Self-pay

## 2023-09-21 DIAGNOSIS — R7881 Bacteremia: Secondary | ICD-10-CM | POA: Diagnosis present

## 2023-09-21 DIAGNOSIS — Z87412 Personal history of vulvar dysplasia: Secondary | ICD-10-CM | POA: Diagnosis not present

## 2023-09-21 DIAGNOSIS — Z79899 Other long term (current) drug therapy: Secondary | ICD-10-CM | POA: Diagnosis not present

## 2023-09-21 DIAGNOSIS — F1110 Opioid abuse, uncomplicated: Secondary | ICD-10-CM | POA: Diagnosis present

## 2023-09-21 DIAGNOSIS — Z885 Allergy status to narcotic agent status: Secondary | ICD-10-CM | POA: Diagnosis not present

## 2023-09-21 DIAGNOSIS — Z880 Allergy status to penicillin: Secondary | ICD-10-CM | POA: Diagnosis not present

## 2023-09-21 DIAGNOSIS — F17210 Nicotine dependence, cigarettes, uncomplicated: Secondary | ICD-10-CM | POA: Diagnosis present

## 2023-09-21 DIAGNOSIS — Z98891 History of uterine scar from previous surgery: Secondary | ICD-10-CM | POA: Diagnosis not present

## 2023-09-21 DIAGNOSIS — B9689 Other specified bacterial agents as the cause of diseases classified elsewhere: Secondary | ICD-10-CM | POA: Diagnosis present

## 2023-09-21 DIAGNOSIS — Z83438 Family history of other disorder of lipoprotein metabolism and other lipidemia: Secondary | ICD-10-CM | POA: Diagnosis not present

## 2023-09-21 DIAGNOSIS — I10 Essential (primary) hypertension: Secondary | ICD-10-CM | POA: Diagnosis present

## 2023-09-21 DIAGNOSIS — Z825 Family history of asthma and other chronic lower respiratory diseases: Secondary | ICD-10-CM | POA: Diagnosis not present

## 2023-09-21 DIAGNOSIS — Z833 Family history of diabetes mellitus: Secondary | ICD-10-CM | POA: Diagnosis not present

## 2023-09-21 DIAGNOSIS — F1990 Other psychoactive substance use, unspecified, uncomplicated: Secondary | ICD-10-CM

## 2023-09-21 DIAGNOSIS — E876 Hypokalemia: Secondary | ICD-10-CM | POA: Diagnosis present

## 2023-09-21 DIAGNOSIS — M009 Pyogenic arthritis, unspecified: Secondary | ICD-10-CM | POA: Diagnosis present

## 2023-09-21 DIAGNOSIS — F419 Anxiety disorder, unspecified: Secondary | ICD-10-CM | POA: Diagnosis present

## 2023-09-21 DIAGNOSIS — M549 Dorsalgia, unspecified: Secondary | ICD-10-CM

## 2023-09-21 DIAGNOSIS — M6008 Infective myositis, other site: Secondary | ICD-10-CM | POA: Diagnosis present

## 2023-09-21 DIAGNOSIS — Z8049 Family history of malignant neoplasm of other genital organs: Secondary | ICD-10-CM | POA: Diagnosis not present

## 2023-09-21 DIAGNOSIS — Z8249 Family history of ischemic heart disease and other diseases of the circulatory system: Secondary | ICD-10-CM | POA: Diagnosis not present

## 2023-09-21 DIAGNOSIS — F32A Depression, unspecified: Secondary | ICD-10-CM | POA: Diagnosis present

## 2023-09-21 DIAGNOSIS — Z823 Family history of stroke: Secondary | ICD-10-CM | POA: Diagnosis not present

## 2023-09-21 DIAGNOSIS — Z801 Family history of malignant neoplasm of trachea, bronchus and lung: Secondary | ICD-10-CM | POA: Diagnosis not present

## 2023-09-21 DIAGNOSIS — Z841 Family history of disorders of kidney and ureter: Secondary | ICD-10-CM | POA: Diagnosis not present

## 2023-09-21 DIAGNOSIS — Z720 Tobacco use: Secondary | ICD-10-CM

## 2023-09-21 LAB — BLOOD CULTURE ID PANEL (REFLEXED) - BCID2

## 2023-09-21 LAB — CBC
HCT: 33.9 % — ABNORMAL LOW (ref 36.0–46.0)
Hemoglobin: 11.5 g/dL — ABNORMAL LOW (ref 12.0–15.0)
MCH: 29 pg (ref 26.0–34.0)
MCHC: 33.9 g/dL (ref 30.0–36.0)
MCV: 85.4 fL (ref 80.0–100.0)
Platelets: 123 K/uL — ABNORMAL LOW (ref 150–400)
RBC: 3.97 MIL/uL (ref 3.87–5.11)
RDW: 13.2 % (ref 11.5–15.5)
WBC: 5.4 K/uL (ref 4.0–10.5)
nRBC: 0 % (ref 0.0–0.2)

## 2023-09-21 LAB — BASIC METABOLIC PANEL WITH GFR
Anion gap: 13 (ref 5–15)
BUN: 12 mg/dL (ref 6–20)
CO2: 28 mmol/L (ref 22–32)
Calcium: 8.6 mg/dL — ABNORMAL LOW (ref 8.9–10.3)
Chloride: 98 mmol/L (ref 98–111)
Creatinine, Ser: 0.72 mg/dL (ref 0.44–1.00)
GFR, Estimated: >60 mL/min (ref >60)
Glucose, Bld: 92 mg/dL (ref 70–99)
Potassium: 3.6 mmol/L (ref 3.5–5.1)
Sodium: 139 mmol/L (ref 135–145)

## 2023-09-21 LAB — SEDIMENTATION RATE: Sed Rate: 53 mm/h — ABNORMAL HIGH (ref 0–30)

## 2023-09-21 LAB — MAGNESIUM: Magnesium: 1.7 mg/dL (ref 1.7–2.4)

## 2023-09-21 LAB — HIV ANTIBODY (ROUTINE TESTING W REFLEX): HIV Screen 4th Generation wRfx: NONREACTIVE

## 2023-09-21 LAB — C-REACTIVE PROTEIN: CRP: 16.2 mg/dL — ABNORMAL HIGH (ref <1.0)

## 2023-09-21 LAB — CK: Total CK: 16 U/L — ABNORMAL LOW (ref 38–234)

## 2023-09-21 MED ORDER — DIAZEPAM 5 MG/ML IJ SOLN
2.5000 mg | Freq: Four times a day (QID) | INTRAMUSCULAR | Status: DC
  Administered 2023-09-21 – 2023-09-24 (×14): 2.5 mg via INTRAVENOUS
  Filled 2023-09-21 (×16): qty 2

## 2023-09-21 MED ORDER — HYDROMORPHONE HCL 1 MG/ML IJ SOLN
1.0000 mg | INTRAMUSCULAR | Status: DC | PRN
  Administered 2023-09-21 – 2023-09-25 (×25): 1 mg via INTRAVENOUS
  Administered 2023-09-25: 0.5 mg via INTRAVENOUS
  Filled 2023-09-21 (×26): qty 1

## 2023-09-21 MED ORDER — KETOROLAC TROMETHAMINE 30 MG/ML IJ SOLN
30.0000 mg | Freq: Four times a day (QID) | INTRAMUSCULAR | Status: DC
  Administered 2023-09-21 – 2023-09-25 (×14): 30 mg via INTRAVENOUS
  Filled 2023-09-21 (×15): qty 1

## 2023-09-21 MED ORDER — NICOTINE 21 MG/24HR TD PT24
21.0000 mg | MEDICATED_PATCH | Freq: Every day | TRANSDERMAL | Status: DC
  Administered 2023-09-22 – 2023-09-25 (×4): 21 mg via TRANSDERMAL
  Filled 2023-09-21 (×4): qty 1

## 2023-09-21 MED ORDER — GADOBUTROL 1 MMOL/ML IV SOLN
6.0000 mL | Freq: Once | INTRAVENOUS | Status: AC | PRN
  Administered 2023-09-21: 6 mL via INTRAVENOUS

## 2023-09-21 MED ORDER — SODIUM CHLORIDE 0.9 % IV SOLN
2.0000 g | Freq: Three times a day (TID) | INTRAVENOUS | Status: DC
  Administered 2023-09-21 – 2023-09-23 (×7): 2 g via INTRAVENOUS
  Filled 2023-09-21 (×7): qty 12.5

## 2023-09-21 NOTE — Progress Notes (Signed)
   09/21/23 0808  TOC Brief Assessment  Insurance and Status Lapsed  Patient has primary care physician Yes  Home environment has been reviewed Home  Prior level of function: Independent  Prior/Current Home Services No current home services  Social Drivers of Health Review SDOH reviewed no interventions necessary  Readmission risk has been reviewed Yes  Transition of care needs no transition of care needs at this time   Transition of Care Department [facility]) has reviewed patient and no TOC needs have been identified at this time. We will continue to monitor patient advancement through interdisciplinary progression rounds. If new patient transition needs arise, please place a TOC consult.

## 2023-09-21 NOTE — Progress Notes (Signed)
 PHARMACY - PHYSICIAN COMMUNICATION CRITICAL VALUE ALERT - BLOOD CULTURE IDENTIFICATION (BCID)  Jamie Evans is an 53 y.o. female who presented to [facility] on 09/20/2023 with a chief complaint of back pain.   Assessment:  Patient presents with back pain and has significant history of IV fentanyl  abuse. Now bacteremic with serratia growing. No antibiotics have been given this admit.   Name of physician (or Provider) Contacted: Tat  Current antibiotics: none  Changes to prescribed antibiotics recommended:  Recommendations accepted by provider Will start cefepime  2g q8 hours  Results for orders placed or performed during the hospital encounter of 09/20/23  Blood Culture ID Panel (Reflexed) (Collected: 09/20/2023  2:07 PM)  Result Value Ref Range   Enterococcus faecalis NOT DETECTED NOT DETECTED   Enterococcus Faecium NOT DETECTED NOT DETECTED   Listeria monocytogenes NOT DETECTED NOT DETECTED   Staphylococcus species NOT DETECTED NOT DETECTED   Staphylococcus aureus (BCID) NOT DETECTED NOT DETECTED   Staphylococcus epidermidis NOT DETECTED NOT DETECTED   Staphylococcus lugdunensis NOT DETECTED NOT DETECTED   Streptococcus species NOT DETECTED NOT DETECTED   Streptococcus agalactiae NOT DETECTED NOT DETECTED   Streptococcus pneumoniae NOT DETECTED NOT DETECTED   Streptococcus pyogenes NOT DETECTED NOT DETECTED   A.calcoaceticus-baumannii NOT DETECTED NOT DETECTED   Bacteroides fragilis NOT DETECTED NOT DETECTED   Enterobacterales DETECTED (A) NOT DETECTED   Enterobacter cloacae complex NOT DETECTED NOT DETECTED   Escherichia coli NOT DETECTED NOT DETECTED   Klebsiella aerogenes NOT DETECTED NOT DETECTED   Klebsiella oxytoca NOT DETECTED NOT DETECTED   Klebsiella pneumoniae NOT DETECTED NOT DETECTED   Proteus species NOT DETECTED NOT DETECTED   Salmonella species NOT DETECTED NOT DETECTED   Serratia marcescens DETECTED (A) NOT DETECTED   Haemophilus influenzae NOT DETECTED NOT  DETECTED   Neisseria meningitidis NOT DETECTED NOT DETECTED   Pseudomonas aeruginosa NOT DETECTED NOT DETECTED   Stenotrophomonas maltophilia NOT DETECTED NOT DETECTED   Candida albicans NOT DETECTED NOT DETECTED   Candida auris NOT DETECTED NOT DETECTED   Candida glabrata NOT DETECTED NOT DETECTED   Candida krusei NOT DETECTED NOT DETECTED   Candida parapsilosis NOT DETECTED NOT DETECTED   Candida tropicalis NOT DETECTED NOT DETECTED   Cryptococcus neoformans/gattii NOT DETECTED NOT DETECTED   CTX-M ESBL NOT DETECTED NOT DETECTED   Carbapenem resistance IMP NOT DETECTED NOT DETECTED   Carbapenem resistance KPC NOT DETECTED NOT DETECTED   Carbapenem resistance NDM NOT DETECTED NOT DETECTED   Carbapenem resist OXA 48 LIKE NOT DETECTED NOT DETECTED   Carbapenem resistance VIM NOT DETECTED NOT DETECTED    Dempsey Blush PharmD., BCPS Clinical Pharmacist 09/21/2023 10:48 AM

## 2023-09-21 NOTE — Progress Notes (Signed)
 PROGRESS NOTE  Jamie Evans FMW:[id number] DOB: Sep 04, 1970 DOA: 09/20/2023 PCP: [facility] (Inactive)  Brief History:  53 year old female with a history of substance use disorder with IV fentanyl , anxiety/depression, hypertension presenting with right lower back pain for 1 week.  The patient states that it has progressively worsened particularly with weightbearing of her right lower extremity.  She states that the pain radiates down her right leg.  She denies any recent injury or trauma.  The patient usually injects fentanyl  about 3 times a day.  She denies any other illicit drug use.  She smokes 1 to 2 packs/day.  She denies any alcohol use. She has not had any fevers, chills, chest pain, shortness breath, coughing, hemoptysis.  She denies any nausea, vomiting, diarrhea, dysuria, hematuria.  She denies any bladder or bowel incontinence. She states that she is able to bear weight but requires assistance to ambulate due to her back pain.  She has not fallen or lost consciousness.  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  In the ED, the patient was hemodynamically stable.  She had low-grade temperature of 99.9 F.  Oxygen saturation is 100% room air.  WBC 7.5, hemoglobin 12.2, platelets 132.  Sodium 141, potassium 3.2, bicarbonate 30, serum creatinine 0.1.  AST 27, ALT 24, alk phosphatase 173, total bilirubin 0.9.  Lactic acid 1.2.  MRI of the thoracic and lumbar spine was negative for any evidence of spinal infection.  There was a question of some edema and enhancement in the right iliopsoas muscular junction which may indicate myositis.  MRI of the pelvis was recommended.  The patient was admitted for pain control as well as his to obtain MRI of the pelvis.   Assessment/Plan:  Intractable back pain - Suspect a degree of myositis -  09/20/2023 MRI T & L spine--negative for spinal infection; question some edema and enhancement of the right iliopsoas junction - Obtain MRI pelvis - Continue judicious IV opioids - Start Robaxin - Start Toradol  - Check ESR - CRP - Follow blood cultures  Tobacco abuse - Tobacco cessation discussed - NicoDerm patch  Hypokalemia - Replete - Check magnesium  Essential hypertension - Previously on losartan  - Monitor blood pressure for now  Substance use disorder - Anticipate the patient has significant opioid tolerance - UDS     Family Communication:   daughter at bedside 8/22  Consultants:  none  Code Status:  FULL  DVT Prophylaxis:  [place] Lovenox    Procedures: As Listed in Progress Note Above  Antibiotics: None       Subjective: Patient complains of pain in the right lower back and buttock area.  She states it is worse with weightbearing.  She has some pain rating down her right hip.  She denies any fevers, chills, headache, chest pain, shortness of breath, cough, hemoptysis, nausea, vomiting, diarrhea.  There is no bowel or bladder incontinence.  Objective: Vitals:   09/20/23 2103 09/20/23 2246 09/21/23 0458 09/21/23 0500  BP:  (!) 153/90 (!) 151/86   Pulse:  (!) 109 70   Resp:  20 16   Temp:  99.9 F (37.7 C) 97.6 F (36.4 C)   TempSrc:  Oral Oral   SpO2: 100% 100% 100%   Weight:    62.8 kg  Height:  Intake/Output Summary (Last 24 hours) at 09/21/2023 9278 Last data filed at 09/21/2023 0500 Gross per 24 hour  Intake 240 ml  Output --  Net 240 ml   Weight change:  Exam:  General:  Pt is alert, follows commands appropriately, not in acute distress HEENT: No icterus, No thrush, No neck mass, [place]/AT Cardiovascular: RRR, S1/S2, no rubs, no gallops Respiratory: Diminished breath sounds.  Bibasilar rales.  No wheezing. Abdomen: Soft/+BS, non tender, non distended, no guarding Extremities: No edema, No lymphangitis, No petechiae, No rashes, no  synovitis Neuro:  CN II-XII intact, strength 4/5 in RUE,  4-/5 RLE, strength 4/5 LUE, LLE; sensation intact bilateral; no dysmetria; babinski equivocal    Data Reviewed: I have personally reviewed following labs and imaging studies Basic Metabolic Panel: Recent Labs  Lab 09/20/23 1352  NA 141  K 3.2*  CL 97*  CO2 30  GLUCOSE 109*  BUN 11  CREATININE 0.81  CALCIUM  8.8*   Liver Function Tests: Recent Labs  Lab 09/20/23 1352  AST 27  ALT 24  ALKPHOS 173*  BILITOT 0.9  PROT 7.0  ALBUMIN 2.8*   No results for input(s): LIPASE, AMYLASE in the last 168 hours. No results for input(s): AMMONIA in the last 168 hours. Coagulation Profile: No results for input(s): INR, PROTIME in the last 168 hours. CBC: Recent Labs  Lab 09/20/23 1352  WBC 7.5  NEUTROABS 6.0  HGB 12.2  HCT 35.6*  MCV 85.2  PLT 132*   Cardiac Enzymes: No results for input(s): CKTOTAL, CKMB, CKMBINDEX, TROPONINI in the last 168 hours. BNP: Invalid input(s): POCBNP CBG: No results for input(s): GLUCAP in the last 168 hours. HbA1C: No results for input(s): HGBA1C in the last 72 hours. Urine analysis:    Component Value Date/Time   COLORURINE YELLOW 11/02/2015 0912   APPEARANCEUR CLEAR 11/02/2015 0912   APPEARANCEUR Cloudy (A) 05/19/2014 1503   LABSPEC 1.020 11/02/2015 0912   PHURINE 5.5 11/02/2015 0912   GLUCOSEU NEGATIVE 11/02/2015 0912   HGBUR NEGATIVE 11/02/2015 0912   BILIRUBINUR NEGATIVE 11/02/2015 0912   BILIRUBINUR Negative 05/19/2014 1503   KETONESUR NEGATIVE 11/02/2015 0912   PROTEINUR NEGATIVE 11/02/2015 0912   UROBILINOGEN 0.2 06/17/2008 2235   NITRITE NEGATIVE 11/02/2015 0912   LEUKOCYTESUR NEGATIVE 11/02/2015 0912   LEUKOCYTESUR 3+ (A) 05/19/2014 1503   Sepsis Labs: @LABRCNTIP (procalcitonin:4,lacticidven:4) ) Recent Results (from the past 240 hours)  Blood culture (routine x 2)     Status: None (Preliminary result)   Collection Time: 09/20/23  1:52 PM    Specimen: BLOOD  Result Value Ref Range Status   Specimen Description BLOOD LEFT ANTECUBITAL  Final   Special Requests   Final    BOTTLES DRAWN AEROBIC AND ANAEROBIC Blood Culture results may not be optimal due to an inadequate volume of blood received in culture bottles   Culture   Final    NO GROWTH < 24 HOURS Performed at [facility], [address], [place], [place] [address]    Report Status PENDING  Incomplete  Blood culture (routine x 2)     Status: None (Preliminary result)   Collection Time: 09/20/23  2:07 PM   Specimen: BLOOD  Result Value Ref Range Status   Specimen Description BLOOD BLOOD LEFT ARM AEROBIC BOTTLE ONLY  Final   Special Requests Blood Culture adequate volume  Final   Culture  Setup Time   Final    GRAM NEGATIVE RODS AEROBIC BOTTLE ONLY Gram Stain Report Called to,Read Back By and Verified  With: K. DAIL [id number] PARAS Performed at [facility], [address], [place], [place] [address]    Culture GRAM NEGATIVE RODS  Final   Report Status PENDING  Incomplete     Scheduled Meds:  enoxaparin  (LOVENOX ) injection  40 mg Subcutaneous Q24H   ibuprofen   600 mg Oral TID   nicotine   14 mg Transdermal Daily   Continuous Infusions:  lactated ringers  100 mL/hr at 09/20/23 2242    Procedures/Studies: MR THORACIC SPINE W WO CONTRAST Result Date: 09/20/2023 CLINICAL DATA:  Back pain.  Intravenous drug use. EXAM: MRI THORACIC AND LUMBAR SPINE WITHOUT AND WITH CONTRAST TECHNIQUE: Multiplanar and multiecho pulse sequences of the thoracic and lumbar spine were obtained without and with intravenous contrast. CONTRAST:  6mL GADAVIST  GADOBUTROL  1 MMOL/ML IV SOLN COMPARISON:  07/08/2013 FINDINGS: MRI THORACIC SPINE FINDINGS Alignment:  No malalignment. Vertebrae: Benign appearing hemangioma within the T7 vertebral body. No other finding. No evidence of bone infection. Cord:  Normal Paraspinal and other soft tissues: Normal Disc levels: No significant disc level finding.  No stenosis of the canal or foramina. Minimal non-compressive disc bulges at T8-9 and T10-11, not likely of any significance. No evidence of facet arthropathy. MRI LUMBAR SPINE FINDINGS Segmentation:  5 lumbar type vertebral bodies. Alignment:  Normal Vertebrae: No fracture or focal bone lesion of significance. Insignificant small a meningioma within the L1 vertebral body. Conus medullaris: Extends to the L1-2 level and appears normal. No worrisome finding. Paraspinal and other soft tissues: Prominent bile duct and pancreatic duct. This may relate to previous cholecystectomy, but is not primarily or completely evaluated. Question if there is some edema and enhancement at the right ileo psoas junction. This could indicate myositis. If there is concern regarding this possibility, consider pelvic MRI with and without contrast. Disc levels: No significant disc level finding. No sign of spinal infection. Minimal non-compressive disc bulge L3-4. No sign of septic facet arthropathy. IMPRESSION: 1. No evidence of spinal infection in the thoracic or lumbar region. 2. Question if there is some edema and enhancement at the right ileo psoas muscular junction. This could indicate myositis. If there is concern regarding this possibility, consider pelvic MRI with and without contrast. I do not think that the finding is definite based on the limited evaluation provided by this exam. a 3. Prominent bile duct and pancreatic duct. This may relate to previous cholecystectomy, but is not primarily or completely evaluated. Electronically Signed   By: Oneil Officer M.D.   On: 09/20/2023 16:37   MR Lumbar Spine W Wo Contrast Result Date: 09/20/2023 CLINICAL DATA:  Back pain.  Intravenous drug use. EXAM: MRI THORACIC AND LUMBAR SPINE WITHOUT AND WITH CONTRAST TECHNIQUE: Multiplanar and multiecho pulse sequences of the thoracic and lumbar spine were obtained without and with intravenous contrast. CONTRAST:  6mL GADAVIST  GADOBUTROL  1  MMOL/ML IV SOLN COMPARISON:  07/08/2013 FINDINGS: MRI THORACIC SPINE FINDINGS Alignment:  No malalignment. Vertebrae: Benign appearing hemangioma within the T7 vertebral body. No other finding. No evidence of bone infection. Cord:  Normal Paraspinal and other soft tissues: Normal Disc levels: No significant disc level finding. No stenosis of the canal or foramina. Minimal non-compressive disc bulges at T8-9 and T10-11, not likely of any significance. No evidence of facet arthropathy. MRI LUMBAR SPINE FINDINGS Segmentation:  5 lumbar type vertebral bodies. Alignment:  Normal Vertebrae: No fracture or focal bone lesion of significance. Insignificant small a meningioma within the L1 vertebral body. Conus medullaris: Extends to the L1-2 level and  appears normal. No worrisome finding. Paraspinal and other soft tissues: Prominent bile duct and pancreatic duct. This may relate to previous cholecystectomy, but is not primarily or completely evaluated. Question if there is some edema and enhancement at the right ileo psoas junction. This could indicate myositis. If there is concern regarding this possibility, consider pelvic MRI with and without contrast. Disc levels: No significant disc level finding. No sign of spinal infection. Minimal non-compressive disc bulge L3-4. No sign of septic facet arthropathy. IMPRESSION: 1. No evidence of spinal infection in the thoracic or lumbar region. 2. Question if there is some edema and enhancement at the right ileo psoas muscular junction. This could indicate myositis. If there is concern regarding this possibility, consider pelvic MRI with and without contrast. I do not think that the finding is definite based on the limited evaluation provided by this exam. a 3. Prominent bile duct and pancreatic duct. This may relate to previous cholecystectomy, but is not primarily or completely evaluated. Electronically Signed   By: Oneil Officer M.D.   On: 09/20/2023 16:37    Alm Schneider,  DO  [facility]  If 7PM-7AM, please contact night-coverage [email or web address] Password [email or web address] 09/21/2023, 7:21 AM   LOS: 0 days

## 2023-09-21 NOTE — Hospital Course (Addendum)
 53 year old female with a history of substance use disorder with IV fentanyl , anxiety/depression, hypertension presenting with right lower back pain for 1 week.  The patient states that it has progressively worsened particularly with weightbearing of her right lower extremity.  She states that the pain radiates down her right leg.  She denies any recent injury or trauma.  The patient usually injects fentanyl  about 3 times a day.  She denies any other illicit drug use.  She smokes 1 to 2 packs/day.  She denies any alcohol use. She has not had any fevers, chills, chest pain, shortness breath, coughing, hemoptysis.  She denies any nausea, vomiting, diarrhea, dysuria, hematuria.  She denies any bladder or bowel incontinence. She states that she is able to bear weight but requires assistance to ambulate due to her back pain.  She has not fallen or lost consciousness.  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  In the ED, the patient was hemodynamically stable.  She had low-grade temperature of 99.9 F.  Oxygen saturation is 100% room air.  WBC 7.5, hemoglobin 12.2, platelets 132.  Sodium 141, potassium 3.2, bicarbonate 30, serum creatinine 0.1.  AST 27, ALT 24, alk phosphatase 173, total bilirubin 0.9.  Lactic acid 1.2.  MRI of the thoracic and lumbar spine was negative for any evidence of spinal infection.  There was a question of some edema and enhancement in the right iliopsoas muscular junction which may indicate myositis.  MRI of the pelvis was recommended.  The patient was admitted for pain control as well as his to obtain MRI of the pelvis. MR pelvis showed Edema and inflammation centered in the right SI joint likely reflective of septic arthritis; moderate reactive edema in the intrapelvic and extrapelvic muscles.  Blood cultures grew Serratia marcescens that was pansensitive.   Patient was started on IV cefepime .  She showed continued clinical improvement with decreased pain and increased mobility.  On the day of d/c, patient had some n/v.  She was given antiemetics and pantoprazole .  She was given the opportunity to stay in hospital another day, but she stated she felt well enough for d/c.  Rx for pantoprazole  and zofran  ODT were provided

## 2023-09-21 NOTE — Plan of Care (Signed)

## 2023-09-22 DIAGNOSIS — E876 Hypokalemia: Secondary | ICD-10-CM

## 2023-09-22 LAB — CBC
HCT: 34 % — ABNORMAL LOW (ref 36.0–46.0)
Hemoglobin: 11.7 g/dL — ABNORMAL LOW (ref 12.0–15.0)
MCH: 28.9 pg (ref 26.0–34.0)
MCHC: 34.4 g/dL (ref 30.0–36.0)
MCV: 84 fL (ref 80.0–100.0)
Platelets: 169 K/uL (ref 150–400)
RBC: 4.05 MIL/uL (ref 3.87–5.11)
RDW: 13.2 % (ref 11.5–15.5)
WBC: 5.5 K/uL (ref 4.0–10.5)
nRBC: 0 % (ref 0.0–0.2)

## 2023-09-22 LAB — VITAMIN B12: Vitamin B-12: 179 pg/mL — ABNORMAL LOW (ref 180–914)

## 2023-09-22 LAB — BASIC METABOLIC PANEL WITH GFR
Anion gap: 13 (ref 5–15)
BUN: 17 mg/dL (ref 6–20)
CO2: 25 mmol/L (ref 22–32)
Calcium: 8.5 mg/dL — ABNORMAL LOW (ref 8.9–10.3)
Chloride: 99 mmol/L (ref 98–111)
Creatinine, Ser: 0.88 mg/dL (ref 0.44–1.00)
GFR, Estimated: >60 mL/min (ref >60)
Glucose, Bld: 103 mg/dL — ABNORMAL HIGH (ref 70–99)
Potassium: 3 mmol/L — ABNORMAL LOW (ref 3.5–5.1)
Sodium: 137 mmol/L (ref 135–145)

## 2023-09-22 LAB — T4, FREE: Free T4: 1.1 ng/dL (ref 0.61–1.12)

## 2023-09-22 LAB — FOLATE: Folate: 5.3 ng/mL — ABNORMAL LOW (ref >5.9)

## 2023-09-22 LAB — MAGNESIUM: Magnesium: 1.9 mg/dL (ref 1.7–2.4)

## 2023-09-22 LAB — TSH: TSH: 0.655 u[IU]/mL (ref 0.350–4.500)

## 2023-09-22 MED ORDER — FOLIC ACID 1 MG PO TABS
1.0000 mg | ORAL_TABLET | Freq: Every day | ORAL | Status: DC
  Administered 2023-09-22 – 2023-09-25 (×4): 1 mg via ORAL
  Filled 2023-09-22 (×4): qty 1

## 2023-09-22 MED ORDER — POTASSIUM CHLORIDE 20 MEQ PO PACK
40.0000 meq | PACK | Freq: Once | ORAL | Status: AC
  Administered 2023-09-22: 40 meq via ORAL
  Filled 2023-09-22: qty 2

## 2023-09-22 MED ORDER — VITAMIN B-12 100 MCG PO TABS
500.0000 ug | ORAL_TABLET | Freq: Every day | ORAL | Status: DC
  Administered 2023-09-23 – 2023-09-25 (×3): 500 ug via ORAL
  Filled 2023-09-22 (×3): qty 5

## 2023-09-22 MED ORDER — POTASSIUM CHLORIDE CRYS ER 20 MEQ PO TBCR: 40.0000 meq | EXTENDED_RELEASE_TABLET | Freq: Once | ORAL | Status: DC

## 2023-09-22 MED ORDER — GABAPENTIN 100 MG PO CAPS
200.0000 mg | ORAL_CAPSULE | Freq: Three times a day (TID) | ORAL | Status: DC
  Administered 2023-09-22 – 2023-09-23 (×4): 200 mg via ORAL
  Filled 2023-09-22 (×4): qty 2

## 2023-09-22 MED ORDER — CYANOCOBALAMIN 1000 MCG/ML IJ SOLN
1000.0000 ug | Freq: Once | INTRAMUSCULAR | Status: AC
  Administered 2023-09-22: 1000 ug via INTRAMUSCULAR
  Filled 2023-09-22: qty 1

## 2023-09-22 NOTE — Progress Notes (Signed)
 PROGRESS NOTE  Jamie Evans FMW:[id number] DOB: August 26, 1970 DOA: 09/20/2023 PCP: [facility] (Inactive)  Brief History:  53 year old female with a history of substance use disorder with IV fentanyl , anxiety/depression, hypertension presenting with right lower back pain for 1 week.  The patient states that it has progressively worsened particularly with weightbearing of her right lower extremity.  She states that the pain radiates down her right leg.  She denies any recent injury or trauma.  The patient usually injects fentanyl  about 3 times a day.  She denies any other illicit drug use.  She smokes 1 to 2 packs/day.  She denies any alcohol use. She has not had any fevers, chills, chest pain, shortness breath, coughing, hemoptysis.  She denies any nausea, vomiting, diarrhea, dysuria, hematuria.  She denies any bladder or bowel incontinence. She states that she is able to bear weight but requires assistance to ambulate due to her back pain.  She has not fallen or lost consciousness.  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  In the ED, the patient was hemodynamically stable.  She had low-grade temperature of 99.9 F.  Oxygen saturation is 100% room air.  WBC 7.5, hemoglobin 12.2, platelets 132.  Sodium 141, potassium 3.2, bicarbonate 30, serum creatinine 0.1.  AST 27, ALT 24, alk phosphatase 173, total bilirubin 0.9.  Lactic acid 1.2.  MRI of the thoracic and lumbar spine was negative for any evidence of spinal infection.  There was a question of some edema and enhancement in the right iliopsoas muscular junction which may indicate myositis.  MRI of the pelvis was recommended.  The patient was admitted for pain control as well as his to obtain MRI of the pelvis.   Assessment/Plan: Septic sacro-ilitis/Myositis - Suspect a degree of  myositis - 09/20/2023 MRI T & L spine--negative for spinal infection; question some edema and enhancement of the right iliopsoas junction - 8/22 MRI pelvis--Edema and inflammation centered in the right SI joint likely reflective of septic arthritis; moderate reactive edema in the intrapelvic and extrapelvic muscles. - Continue judicious IV opioids - add gabapentin  - started IV valium  - Check ESR 53 - CRP--16.2 - Follow blood cultures>>GNR - discussed with neurosurgery and ortho>>continue nonoperative management  Serratia Bacteremia -started cefepime  -repeat blood cultures x 2   Tobacco abuse - Tobacco cessation discussed - NicoDerm patch   Hypokalemia - Replete - Check magnesium 1.9   Essential hypertension - Previously on losartan  - Monitor blood pressure for now   Substance use disorder - Anticipate the patient has significant opioid tolerance - UDS  Low B12/low folate -replace         Family Communication:   daughter at bedside 8/23   Consultants:  none   Code Status:  FULL   DVT Prophylaxis:  [place] Lovenox      Procedures: As Listed in Progress Note Above   Antibiotics: None      Subjective: Pt complains of pain in right buttock, lower back.  Denies f/c, sob, n/v/d, abd pain  Objective: Vitals:   09/21/23 0836 09/21/23 1306 09/21/23 1900 09/22/23 1419  BP: (!) 172/97 (!) 182/67 134/82 (!) 155/108  Pulse: 81 82 90   Resp:    18  Temp: 97.6 F (36.4 C) 97.7 F (36.5 C) 97.9 F (36.6 C) 98.7 F (37.1  C)  TempSrc: Oral Axillary Oral Oral  SpO2:   100%   Weight:      Height:        Intake/Output Summary (Last 24 hours) at 09/22/2023 1641 Last data filed at 09/22/2023 1208 Gross per 24 hour  Intake 980 ml  Output --  Net 980 ml   Weight change:  Exam:  General:  Pt is alert, follows commands appropriately, not in acute distress HEENT: No icterus, No thrush, No neck mass, [place]/AT Cardiovascular: RRR, S1/S2, no rubs, no gallops Respiratory:  diminished BS.  Bibasilar rales. No wheeze Abdomen: Soft/+BS, non tender, non distended, no guarding Extremities: No edema, No lymphangitis, No petechiae, No rashes, no synovitis Neuro:  CN II-XII intact, strength 4/5 in RUE, 4-/5 RLE, strength 4/5 LUE, LLE; sensation intact bilateral; no dysmetria; babinski equivocal    Data Reviewed: I have personally reviewed following labs and imaging studies Basic Metabolic Panel: Recent Labs  Lab 09/20/23 1352 09/21/23 0841 09/22/23 0352  NA 141 139 137  K 3.2* 3.6 3.0*  CL 97* 98 99  CO2 30 28 25   GLUCOSE 109* 92 103*  BUN 11 12 17   CREATININE 0.81 0.72 0.88  CALCIUM  8.8* 8.6* 8.5*  MG  --  1.7 1.9   Liver Function Tests: Recent Labs  Lab 09/20/23 1352  AST 27  ALT 24  ALKPHOS 173*  BILITOT 0.9  PROT 7.0  ALBUMIN 2.8*   No results for input(s): LIPASE, AMYLASE in the last 168 hours. No results for input(s): AMMONIA in the last 168 hours. Coagulation Profile: No results for input(s): INR, PROTIME in the last 168 hours. CBC: Recent Labs  Lab 09/20/23 1352 09/21/23 0841 09/22/23 0352  WBC 7.5 5.4 5.5  NEUTROABS 6.0  --   --   HGB 12.2 11.5* 11.7*  HCT 35.6* 33.9* 34.0*  MCV 85.2 85.4 84.0  PLT 132* 123* 169   Cardiac Enzymes: Recent Labs  Lab 09/21/23 0841  CKTOTAL 16*   BNP: Invalid input(s): POCBNP CBG: No results for input(s): GLUCAP in the last 168 hours. HbA1C: No results for input(s): HGBA1C in the last 72 hours. Urine analysis:    Component Value Date/Time   COLORURINE YELLOW 11/02/2015 0912   APPEARANCEUR CLEAR 11/02/2015 0912   APPEARANCEUR Cloudy (A) 05/19/2014 1503   LABSPEC 1.020 11/02/2015 0912   PHURINE 5.5 11/02/2015 0912   GLUCOSEU NEGATIVE 11/02/2015 0912   HGBUR NEGATIVE 11/02/2015 0912   BILIRUBINUR NEGATIVE 11/02/2015 0912   BILIRUBINUR Negative 05/19/2014 1503   KETONESUR NEGATIVE 11/02/2015 0912   PROTEINUR NEGATIVE 11/02/2015 0912   UROBILINOGEN 0.2 06/17/2008  2235   NITRITE NEGATIVE 11/02/2015 0912   LEUKOCYTESUR NEGATIVE 11/02/2015 0912   LEUKOCYTESUR 3+ (A) 05/19/2014 1503   Sepsis Labs: @LABRCNTIP (procalcitonin:4,lacticidven:4) ) Recent Results (from the past 240 hours)  Blood culture (routine x 2)     Status: None (Preliminary result)   Collection Time: 09/20/23  1:52 PM   Specimen: BLOOD  Result Value Ref Range Status   Specimen Description   Final    BLOOD LEFT ANTECUBITAL Performed at [facility], [address] [place], [place] [address]    Special Requests   Final    BOTTLES DRAWN AEROBIC AND ANAEROBIC Blood Culture results may not be optimal due to an inadequate volume of blood received in culture bottles Performed at [facility], [address], [place], [place] [address]    Culture  Setup Time   Final    GRAM NEGATIVE RODS AEROBIC BOTTLE  ONLY Gram Stain Report Called to,Read Back By and Verified With: R TEJEDA AT 1635 ON [id number] BY S DALTON Performed at [facility], [address], [place], [place] [address]    Culture   Final    VONNE NEGATIVE RODS TOO YOUNG TO READ Performed at [facility], [address] [place], [place] [address]    Report Status PENDING  Incomplete  Blood culture (routine x 2)     Status: None (Preliminary result)   Collection Time: 09/20/23  2:07 PM   Specimen: BLOOD LEFT ARM  Result Value Ref Range Status   Specimen Description BLOOD LEFT ARM  Final   Special Requests AEROBIC BOTTLE ONLY Blood Culture adequate volume  Final   Culture  Setup Time   Final    GRAM NEGATIVE RODS AEROBIC BOTTLE ONLY Gram Stain Report Called to,Read Back By and Verified With: K. DAIL [id number], VIRAY,J CRITICAL RESULT CALLED TO, READ BACK BY AND VERIFIED WITH: PHARMD DEMPSEY BLUSH [id number] AT 1006 BY EC    Culture   Final    GRAM NEGATIVE RODS CULTURE REINCUBATED FOR BETTER GROWTH Performed at [facility], [address] [place], [place] [address]    Report Status PENDING  Incomplete  Blood  Culture ID Panel (Reflexed)     Status: Abnormal   Collection Time: 09/20/23  2:07 PM  Result Value Ref Range Status   Enterococcus faecalis NOT DETECTED NOT DETECTED Final   Enterococcus Faecium NOT DETECTED NOT DETECTED Final   Listeria monocytogenes NOT DETECTED NOT DETECTED Final   Staphylococcus species NOT DETECTED NOT DETECTED Final   Staphylococcus aureus (BCID) NOT DETECTED NOT DETECTED Final   Staphylococcus epidermidis NOT DETECTED NOT DETECTED Final   Staphylococcus lugdunensis NOT DETECTED NOT DETECTED Final   Streptococcus species NOT DETECTED NOT DETECTED Final   Streptococcus agalactiae NOT DETECTED NOT DETECTED Final   Streptococcus pneumoniae NOT DETECTED NOT DETECTED Final   Streptococcus pyogenes NOT DETECTED NOT DETECTED Final   A.calcoaceticus-baumannii NOT DETECTED NOT DETECTED Final   Bacteroides fragilis NOT DETECTED NOT DETECTED Final   Enterobacterales DETECTED (A) NOT DETECTED Final    Comment: Enterobacterales represent a large order of gram negative bacteria, not a single organism.   Enterobacter cloacae complex NOT DETECTED NOT DETECTED Final   Escherichia coli NOT DETECTED NOT DETECTED Final   Klebsiella aerogenes NOT DETECTED NOT DETECTED Final   Klebsiella oxytoca NOT DETECTED NOT DETECTED Final   Klebsiella pneumoniae NOT DETECTED NOT DETECTED Final   Proteus species NOT DETECTED NOT DETECTED Final   Salmonella species NOT DETECTED NOT DETECTED Final   Serratia marcescens DETECTED (A) NOT DETECTED Final    Comment: CRITICAL RESULT CALLED TO, READ BACK BY AND VERIFIED WITH: PHARMD DEMPSEY BLUSH [id number] AT 1006 BY EC    Haemophilus influenzae NOT DETECTED NOT DETECTED Final   Neisseria meningitidis NOT DETECTED NOT DETECTED Final   Pseudomonas aeruginosa NOT DETECTED NOT DETECTED Final   Stenotrophomonas maltophilia NOT DETECTED NOT DETECTED Final   Candida albicans NOT DETECTED NOT DETECTED Final   Candida auris NOT DETECTED NOT DETECTED Final    Candida glabrata NOT DETECTED NOT DETECTED Final   Candida krusei NOT DETECTED NOT DETECTED Final   Candida parapsilosis NOT DETECTED NOT DETECTED Final   Candida tropicalis NOT DETECTED NOT DETECTED Final   Cryptococcus neoformans/gattii NOT DETECTED NOT DETECTED Final    Comment: CRITICAL RESULT CALLED TO, READ BACK BY AND VERIFIED WITH: MAYA DEMPSEY BLUSH [id number] AT 1006  BY EC    CTX-M ESBL NOT DETECTED NOT DETECTED Final   Carbapenem resistance IMP NOT DETECTED NOT DETECTED Final   Carbapenem resistance KPC NOT DETECTED NOT DETECTED Final   Carbapenem resistance NDM NOT DETECTED NOT DETECTED Final   Carbapenem resist OXA 48 LIKE NOT DETECTED NOT DETECTED Final   Carbapenem resistance VIM NOT DETECTED NOT DETECTED Final    Comment: Performed at [facility], [address], [place], [place] [address]  Culture, blood (Routine X 2) w Reflex to ID Panel     Status: None (Preliminary result)   Collection Time: 09/22/23  3:52 AM   Specimen: Right Antecubital; Blood  Result Value Ref Range Status   Specimen Description RIGHT ANTECUBITAL  Final   Special Requests   Final    BOTTLES DRAWN AEROBIC AND ANAEROBIC Blood Culture adequate volume Performed at [facility], [address] [place], [place] [address]    Culture PENDING  Incomplete   Report Status PENDING  Incomplete  Culture, blood (Routine X 2) w Reflex to ID Panel     Status: None (Preliminary result)   Collection Time: 09/22/23  3:52 AM   Specimen: BLOOD RIGHT HAND  Result Value Ref Range Status   Specimen Description BLOOD RIGHT HAND  Final   Special Requests   Final    BOTTLES DRAWN AEROBIC AND ANAEROBIC Blood Culture adequate volume Performed at [facility], [address], [place], [place] [address]    Culture PENDING  Incomplete   Report Status PENDING  Incomplete     Scheduled Meds:  diazepam   2.5 mg Intravenous Q6H   enoxaparin  (LOVENOX ) injection  40 mg Subcutaneous Q24H   ketorolac   30 mg Intravenous  Q6H   nicotine   21 mg Transdermal Daily   Continuous Infusions:  ceFEPime  (MAXIPIME ) IV 2 g (09/22/23 1208)    Procedures/Studies: MR PELVIS W WO CONTRAST Result Date: 09/22/2023 MR PELVIS WITHOUT THEN WITH IV CONTRAST COMPARISON: MRI lumbar spine CLINICAL HISTORY: Abnormal MRI lumbar spine. PULSE SEQUENCES: AX T1, Ax T2 FS, Cor T1, COR STIR & SMALL FOV COR PD FS with and without IV contrast FINDINGS: Bones: There is abnormal edema centered in the right SI joint suspicious for septic arthritis of the right SI joint. Clinical correlation. There is moderate edema identified in the adjacent intrapelvic muscles and extrapelvic muscles on the right. There is no focal enhancing collection to suggest soft tissue abscess at the current time. Otherwise, the visualized pelvis and left SI joint are unremarkable. Proximal femurs are unremarkable. Limited evaluation of the intrapelvic structures demonstrate no significant abnormality. There is likely a functional ovarian cyst on the right measuring 3.5 cm. IMPRESSION: Edema and inflammation centered in the right SI joint likely reflective of septic arthritis. There is moderate reactive edema in the intrapelvic and extrapelvic muscles. There is no focal drainable collection is suggestion abscess at the current time. Follow-up after conservative treatment recommended to ensure resolution. Electronically signed by: Norleen Satchel MD 09/22/2023 12:28 PM EDT RP Workstation: [id number]   MR THORACIC SPINE W WO CONTRAST Result Date: 09/20/2023 CLINICAL DATA:  Back pain.  Intravenous drug use. EXAM: MRI THORACIC AND LUMBAR SPINE WITHOUT AND WITH CONTRAST TECHNIQUE: Multiplanar and multiecho pulse sequences of the thoracic and lumbar spine were obtained without and with intravenous contrast. CONTRAST:  6mL GADAVIST  GADOBUTROL  1 MMOL/ML IV SOLN COMPARISON:  07/08/2013 FINDINGS: MRI THORACIC SPINE FINDINGS Alignment:  No malalignment. Vertebrae: Benign appearing hemangioma  within the T7 vertebral body. No other  finding. No evidence of bone infection. Cord:  Normal Paraspinal and other soft tissues: Normal Disc levels: No significant disc level finding. No stenosis of the canal or foramina. Minimal non-compressive disc bulges at T8-9 and T10-11, not likely of any significance. No evidence of facet arthropathy. MRI LUMBAR SPINE FINDINGS Segmentation:  5 lumbar type vertebral bodies. Alignment:  Normal Vertebrae: No fracture or focal bone lesion of significance. Insignificant small a meningioma within the L1 vertebral body. Conus medullaris: Extends to the L1-2 level and appears normal. No worrisome finding. Paraspinal and other soft tissues: Prominent bile duct and pancreatic duct. This may relate to previous cholecystectomy, but is not primarily or completely evaluated. Question if there is some edema and enhancement at the right ileo psoas junction. This could indicate myositis. If there is concern regarding this possibility, consider pelvic MRI with and without contrast. Disc levels: No significant disc level finding. No sign of spinal infection. Minimal non-compressive disc bulge L3-4. No sign of septic facet arthropathy. IMPRESSION: 1. No evidence of spinal infection in the thoracic or lumbar region. 2. Question if there is some edema and enhancement at the right ileo psoas muscular junction. This could indicate myositis. If there is concern regarding this possibility, consider pelvic MRI with and without contrast. I do not think that the finding is definite based on the limited evaluation provided by this exam. a 3. Prominent bile duct and pancreatic duct. This may relate to previous cholecystectomy, but is not primarily or completely evaluated. Electronically Signed   By: Oneil Officer M.D.   On: 09/20/2023 16:37   MR Lumbar Spine W Wo Contrast Result Date: 09/20/2023 CLINICAL DATA:  Back pain.  Intravenous drug use. EXAM: MRI THORACIC AND LUMBAR SPINE WITHOUT AND WITH CONTRAST  TECHNIQUE: Multiplanar and multiecho pulse sequences of the thoracic and lumbar spine were obtained without and with intravenous contrast. CONTRAST:  6mL GADAVIST  GADOBUTROL  1 MMOL/ML IV SOLN COMPARISON:  07/08/2013 FINDINGS: MRI THORACIC SPINE FINDINGS Alignment:  No malalignment. Vertebrae: Benign appearing hemangioma within the T7 vertebral body. No other finding. No evidence of bone infection. Cord:  Normal Paraspinal and other soft tissues: Normal Disc levels: No significant disc level finding. No stenosis of the canal or foramina. Minimal non-compressive disc bulges at T8-9 and T10-11, not likely of any significance. No evidence of facet arthropathy. MRI LUMBAR SPINE FINDINGS Segmentation:  5 lumbar type vertebral bodies. Alignment:  Normal Vertebrae: No fracture or focal bone lesion of significance. Insignificant small a meningioma within the L1 vertebral body. Conus medullaris: Extends to the L1-2 level and appears normal. No worrisome finding. Paraspinal and other soft tissues: Prominent bile duct and pancreatic duct. This may relate to previous cholecystectomy, but is not primarily or completely evaluated. Question if there is some edema and enhancement at the right ileo psoas junction. This could indicate myositis. If there is concern regarding this possibility, consider pelvic MRI with and without contrast. Disc levels: No significant disc level finding. No sign of spinal infection. Minimal non-compressive disc bulge L3-4. No sign of septic facet arthropathy. IMPRESSION: 1. No evidence of spinal infection in the thoracic or lumbar region. 2. Question if there is some edema and enhancement at the right ileo psoas muscular junction. This could indicate myositis. If there is concern regarding this possibility, consider pelvic MRI with and without contrast. I do not think that the finding is definite based on the limited evaluation provided by this exam. a 3. Prominent bile duct and pancreatic duct. This may  relate to previous cholecystectomy, but is not primarily or completely evaluated. Electronically Signed   By: Oneil Officer M.D.   On: 09/20/2023 16:37    Alm Schneider, DO  [facility]  If 7PM-7AM, please contact night-coverage [email or web address] Password [facility] 09/22/2023, 4:41 PM   LOS: 1 day

## 2023-09-22 NOTE — Progress Notes (Addendum)
 Pt continuously complains, crying and yelling out in pain - constant lower right hip and leg pain, stated Dilaudid  1 mg was ineffective. Informed pt prn med of Dilaudid  and sch Toradol  could not be given until midnight. Sent a secure chat to on call MD per pt and family request to increase Dilaudid  dose. No new orders were placed.

## 2023-09-23 DIAGNOSIS — R7881 Bacteremia: Secondary | ICD-10-CM

## 2023-09-23 LAB — BASIC METABOLIC PANEL WITH GFR
Anion gap: 7 (ref 5–15)
BUN: 16 mg/dL (ref 6–20)
CO2: 29 mmol/L (ref 22–32)
Calcium: 8.1 mg/dL — ABNORMAL LOW (ref 8.9–10.3)
Chloride: 104 mmol/L (ref 98–111)
Creatinine, Ser: 0.94 mg/dL (ref 0.44–1.00)
GFR, Estimated: >60 mL/min (ref >60)
Glucose, Bld: 113 mg/dL — ABNORMAL HIGH (ref 70–99)
Potassium: 4 mmol/L (ref 3.5–5.1)
Sodium: 140 mmol/L (ref 135–145)

## 2023-09-23 LAB — MAGNESIUM: Magnesium: 1.8 mg/dL (ref 1.7–2.4)

## 2023-09-23 LAB — HEPATITIS PANEL, ACUTE
HCV Ab: REACTIVE — AB
Hep A IgM: NONREACTIVE
Hep B C IgM: NONREACTIVE
Hepatitis B Surface Ag: NONREACTIVE

## 2023-09-23 MED ORDER — AMITRIPTYLINE HCL 25 MG PO TABS
25.0000 mg | ORAL_TABLET | Freq: Every day | ORAL | Status: DC
  Administered 2023-09-23 – 2023-09-24 (×2): 25 mg via ORAL
  Filled 2023-09-23 (×2): qty 1

## 2023-09-23 MED ORDER — GABAPENTIN 300 MG PO CAPS
300.0000 mg | ORAL_CAPSULE | Freq: Three times a day (TID) | ORAL | Status: DC
  Administered 2023-09-23 – 2023-09-25 (×5): 300 mg via ORAL
  Filled 2023-09-23 (×5): qty 1

## 2023-09-23 MED ORDER — SODIUM CHLORIDE 0.9 % IV SOLN
2.0000 g | INTRAVENOUS | Status: DC
  Administered 2023-09-23: 2 g via INTRAVENOUS
  Filled 2023-09-23: qty 20

## 2023-09-23 MED ORDER — ALUM & MAG HYDROXIDE-SIMETH 200-200-20 MG/5ML PO SUSP
30.0000 mL | Freq: Four times a day (QID) | ORAL | Status: DC | PRN
  Administered 2023-09-24: 30 mL via ORAL
  Filled 2023-09-23: qty 30

## 2023-09-23 NOTE — Progress Notes (Addendum)
 PROGRESS NOTE  Jamie Evans FMW:[id number] DOB: 05-19-1970 DOA: 09/20/2023 PCP: [facility] (Inactive)  Brief History:  53 year old female with a history of substance use disorder with IV fentanyl , anxiety/depression, hypertension presenting with right lower back pain for 1 week.  The patient states that it has progressively worsened particularly with weightbearing of her right lower extremity.  She states that the pain radiates down her right leg.  She denies any recent injury or trauma.  The patient usually injects fentanyl  about 3 times a day.  She denies any other illicit drug use.  She smokes 1 to 2 packs/day.  She denies any alcohol use. She has not had any fevers, chills, chest pain, shortness breath, coughing, hemoptysis.  She denies any nausea, vomiting, diarrhea, dysuria, hematuria.  She denies any bladder or bowel incontinence. She states that she is able to bear weight but requires assistance to ambulate due to her back pain.  She has not fallen or lost consciousness.  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  In the ED, the patient was hemodynamically stable.  She had low-grade temperature of 99.9 F.  Oxygen saturation is 100% room air.  WBC 7.5, hemoglobin 12.2, platelets 132.  Sodium 141, potassium 3.2, bicarbonate 30, serum creatinine 0.1.  AST 27, ALT 24, alk phosphatase 173, total bilirubin 0.9.  Lactic acid 1.2.  MRI of the thoracic and lumbar spine was negative for any evidence of spinal infection.  There was a question of some edema and enhancement in the right iliopsoas muscular junction which may indicate myositis.  MRI of the pelvis was recommended.  The patient was admitted for pain control as well as his to obtain MRI of the pelvis.   Assessment/Plan: Septic sacro-ilitis/Myositis - Suspect a degree of  myositis - 09/20/2023 MRI T & L spine--negative for spinal infection; question some edema and enhancement of the right iliopsoas junction - 8/22 MRI pelvis--Edema and inflammation centered in the right SI joint likely reflective of septic arthritis; moderate reactive edema in the intrapelvic and extrapelvic muscles. - Continue judicious IV opioids - add gabapentin  - started IV valium  - Check ESR 53 - CRP--16.2 - Follow blood cultures>>Serratia - 09/22/23 discussed with neurosurgery Katharina Beck with Dr. Mavis) and ortho (Dr. Elsa) >>>continue nonoperative management   Serratia Bacteremia -started cefepime >>ceftriaxone  -repeat blood cultures x 2--neg to date   Tobacco abuse - Tobacco cessation discussed - NicoDerm patch   Hypokalemia - Replete - Check magnesium 1.9   Essential hypertension - Previously on losartan  - Monitor blood pressure for now   Substance use disorder - Anticipate the patient has significant opioid tolerance - UDS   Low B12/low folate -replace  Uncontrolled Pain -continue IV dilaudid  for now -continue valium  (primarily for as muscle relaxant) -continue toradol  -added gabapentin  -add amitriptyline  at hs  HCV antibody positive -check HCV RNA          Family Communication:   daughter at bedside 8/24  Consultants: phone with neurosurgery/ortho   Code Status:  FULL /  DVT Prophylaxis:  [place] Lovenox    Procedures: As Listed in Progress Note Above  Antibiotics: Cefepime  8/22>>8/24 Ceftriaxone  8/24>>      Subjective: Pt states she was able to walk to BR today despite having pain in right leg.  Denies f/c, cp, sob, n/v/d.  Had BM in last 24 hours.  No abd pain  Objective: Vitals:   09/22/23 1419 09/22/23 1900 09/23/23 0300 09/23/23 1303  BP: (!) 155/108 (!) 163/102 129/77 (!) 155/92  Pulse:    71  Resp: 18 18 18 18   Temp: 98.7 F (37.1 C) 98.6 F (37 C) 98.1 F (36.7 C) 98.2 F (36.8 C)  TempSrc: Oral Oral Oral   SpO2:     98%  Weight:      Height:        Intake/Output Summary (Last 24 hours) at 09/23/2023 1640 Last data filed at 09/23/2023 1300 Gross per 24 hour  Intake 940 ml  Output --  Net 940 ml   Weight change:  Exam:  General:  Pt is alert, follows commands appropriately, not in acute distress HEENT: No icterus, No thrush, No neck mass, [place]/AT Cardiovascular: RRR, S1/S2, no rubs, no gallops Respiratory: diminished BS. Bibasilar rales. Abdomen: Soft/+BS, non tender, non distended, no guarding Extremities: No edema, No lymphangitis, No petechiae, No rashes, no synovitis Neuro:  CN II-XII intact, strength 4/5 in RUE, 4-/5 RLE, strength 4/5 LUE, LLE; sensation intact bilateral; no dysmetria; babinski equivocal    Data Reviewed: I have personally reviewed following labs and imaging studies Basic Metabolic Panel: Recent Labs  Lab 09/20/23 1352 09/21/23 0841 09/22/23 0352 09/23/23 0331  NA 141 139 137 140  K 3.2* 3.6 3.0* 4.0  CL 97* 98 99 104  CO2 30 28 25 29   GLUCOSE 109* 92 103* 113*  BUN 11 12 17 16   CREATININE 0.81 0.72 0.88 0.94  CALCIUM  8.8* 8.6* 8.5* 8.1*  MG  --  1.7 1.9 1.8   Liver Function Tests: Recent Labs  Lab 09/20/23 1352  AST 27  ALT 24  ALKPHOS 173*  BILITOT 0.9  PROT 7.0  ALBUMIN 2.8*   No results for input(s): LIPASE, AMYLASE in the last 168 hours. No results for input(s): AMMONIA in the last 168 hours. Coagulation Profile: No results for input(s): INR, PROTIME in the last 168 hours. CBC: Recent Labs  Lab 09/20/23 1352 09/21/23 0841 09/22/23 0352  WBC 7.5 5.4 5.5  NEUTROABS 6.0  --   --   HGB 12.2 11.5* 11.7*  HCT 35.6* 33.9* 34.0*  MCV 85.2 85.4 84.0  PLT 132* 123* 169   Cardiac Enzymes: Recent Labs  Lab 09/21/23 0841  CKTOTAL 16*   BNP: Invalid input(s): POCBNP CBG: No results for input(s): GLUCAP in the last 168 hours. HbA1C: No results for input(s): HGBA1C in the last 72 hours. Urine analysis:    Component Value  Date/Time   COLORURINE YELLOW 11/02/2015 0912   APPEARANCEUR CLEAR 11/02/2015 0912   APPEARANCEUR Cloudy (A) 05/19/2014 1503   LABSPEC 1.020 11/02/2015 0912   PHURINE 5.5 11/02/2015 0912   GLUCOSEU NEGATIVE 11/02/2015 0912   HGBUR NEGATIVE 11/02/2015 0912   BILIRUBINUR NEGATIVE 11/02/2015 0912   BILIRUBINUR Negative 05/19/2014 1503   KETONESUR NEGATIVE 11/02/2015 0912   PROTEINUR NEGATIVE 11/02/2015 0912   UROBILINOGEN 0.2 06/17/2008 2235   NITRITE NEGATIVE 11/02/2015 0912   LEUKOCYTESUR NEGATIVE 11/02/2015 0912   LEUKOCYTESUR 3+ (A) 05/19/2014 1503   Sepsis Labs: @LABRCNTIP (procalcitonin:4,lacticidven:4) ) Recent Results (from the past 240 hours)  Blood culture (routine x 2)     Status: Abnormal (Preliminary result)   Collection Time: 09/20/23  1:52 PM   Specimen: BLOOD  Result Value Ref Range Status   Specimen Description   Final    BLOOD LEFT ANTECUBITAL Performed at [facility],  [address], [place], [place] [address]    Special Requests   Final    BOTTLES DRAWN AEROBIC AND ANAEROBIC Blood Culture results may not be optimal due to an inadequate volume of blood received in culture bottles Performed at [facility], [address] [place], [place] [address]    Culture  Setup Time   Final    GRAM NEGATIVE RODS AEROBIC BOTTLE ONLY Gram Stain Report Called to,Read Back By and Verified With: R TEJEDA AT 1635 ON [id number] BY S DALTON Performed at [facility], [address], [place], [place] [address]    Culture (A)  Final    SERRATIA MARCESCENS CULTURE REINCUBATED FOR BETTER GROWTH Performed at [facility], [address] [place], [place] [address]    Report Status PENDING  Incomplete  Blood culture (routine x 2)     Status: Abnormal (Preliminary result)   Collection Time: 09/20/23  2:07 PM   Specimen: BLOOD LEFT ARM  Result Value Ref Range Status   Specimen Description BLOOD LEFT ARM  Final   Special Requests AEROBIC BOTTLE ONLY Blood Culture adequate volume   Final   Culture  Setup Time   Final    GRAM NEGATIVE RODS AEROBIC BOTTLE ONLY Gram Stain Report Called to,Read Back By and Verified With: K. DAIL [id number], VIRAY,J CRITICAL RESULT CALLED TO, READ BACK BY AND VERIFIED WITH: PHARMD DEMPSEY BLUSH [id number] AT 1006 BY EC    Culture (A)  Final    SERRATIA MARCESCENS CULTURE REINCUBATED FOR BETTER GROWTH Performed at [facility], [address] [place], [place] [address]    Report Status PENDING  Incomplete   Organism ID, Bacteria SERRATIA MARCESCENS  Final      Susceptibility   Serratia marcescens - MIC*    CEFEPIME  <=0.12 SENSITIVE Sensitive     ERTAPENEM <=0.12 SENSITIVE Sensitive     CEFTRIAXONE  <=0.25 SENSITIVE Sensitive     CIPROFLOXACIN <=0.06 SENSITIVE Sensitive     GENTAMICIN <=1 SENSITIVE Sensitive     MEROPENEM <=0.25 SENSITIVE Sensitive     TRIMETH /SULFA  <=20 SENSITIVE Sensitive     * SERRATIA MARCESCENS  Blood Culture ID Panel (Reflexed)     Status: Abnormal   Collection Time: 09/20/23  2:07 PM  Result Value Ref Range Status   Enterococcus faecalis NOT DETECTED NOT DETECTED Final   Enterococcus Faecium NOT DETECTED NOT DETECTED Final   Listeria monocytogenes NOT DETECTED NOT DETECTED Final   Staphylococcus species NOT DETECTED NOT DETECTED Final   Staphylococcus aureus (BCID) NOT DETECTED NOT DETECTED Final   Staphylococcus epidermidis NOT DETECTED NOT DETECTED Final   Staphylococcus lugdunensis NOT DETECTED NOT DETECTED Final   Streptococcus species NOT DETECTED NOT DETECTED Final   Streptococcus agalactiae NOT DETECTED NOT DETECTED Final   Streptococcus pneumoniae NOT DETECTED NOT DETECTED Final   Streptococcus pyogenes NOT DETECTED NOT DETECTED Final   A.calcoaceticus-baumannii NOT DETECTED NOT DETECTED Final   Bacteroides fragilis NOT DETECTED NOT DETECTED Final   Enterobacterales DETECTED (A) NOT DETECTED Final    Comment: Enterobacterales represent a large order of gram negative bacteria, not a single  organism.   Enterobacter cloacae complex NOT DETECTED NOT DETECTED Final   Escherichia coli NOT DETECTED NOT DETECTED Final   Klebsiella aerogenes NOT DETECTED NOT DETECTED Final   Klebsiella oxytoca NOT DETECTED NOT DETECTED Final   Klebsiella pneumoniae NOT DETECTED NOT DETECTED Final   Proteus species NOT DETECTED NOT DETECTED Final   Salmonella species NOT DETECTED NOT DETECTED Final  Serratia marcescens DETECTED (A) NOT DETECTED Final    Comment: CRITICAL RESULT CALLED TO, READ BACK BY AND VERIFIED WITH: PHARMD DEMPSEY BLUSH [id number] AT 1006 BY EC    Haemophilus influenzae NOT DETECTED NOT DETECTED Final   Neisseria meningitidis NOT DETECTED NOT DETECTED Final   Pseudomonas aeruginosa NOT DETECTED NOT DETECTED Final   Stenotrophomonas maltophilia NOT DETECTED NOT DETECTED Final   Candida albicans NOT DETECTED NOT DETECTED Final   Candida auris NOT DETECTED NOT DETECTED Final   Candida glabrata NOT DETECTED NOT DETECTED Final   Candida krusei NOT DETECTED NOT DETECTED Final   Candida parapsilosis NOT DETECTED NOT DETECTED Final   Candida tropicalis NOT DETECTED NOT DETECTED Final   Cryptococcus neoformans/gattii NOT DETECTED NOT DETECTED Final    Comment: CRITICAL RESULT CALLED TO, READ BACK BY AND VERIFIED WITH: PHARMD DEMPSEY BLUSH [id number] AT 1006 BY EC    CTX-M ESBL NOT DETECTED NOT DETECTED Final   Carbapenem resistance IMP NOT DETECTED NOT DETECTED Final   Carbapenem resistance KPC NOT DETECTED NOT DETECTED Final   Carbapenem resistance NDM NOT DETECTED NOT DETECTED Final   Carbapenem resist OXA 48 LIKE NOT DETECTED NOT DETECTED Final   Carbapenem resistance VIM NOT DETECTED NOT DETECTED Final    Comment: Performed at [facility], [address] [place], [place] [address]  Culture, blood (Routine X 2) w Reflex to ID Panel     Status: None (Preliminary result)   Collection Time: 09/22/23  3:52 AM   Specimen: Right Antecubital; Blood  Result Value Ref Range Status    Specimen Description RIGHT ANTECUBITAL  Final   Special Requests   Final    BOTTLES DRAWN AEROBIC AND ANAEROBIC Blood Culture adequate volume   Culture   Final    NO GROWTH 1 DAY Performed at [facility], [address], [place], [place] [address]    Report Status PENDING  Incomplete  Culture, blood (Routine X 2) w Reflex to ID Panel     Status: None (Preliminary result)   Collection Time: 09/22/23  3:52 AM   Specimen: BLOOD RIGHT HAND  Result Value Ref Range Status   Specimen Description BLOOD RIGHT HAND  Final   Special Requests   Final    BOTTLES DRAWN AEROBIC AND ANAEROBIC Blood Culture adequate volume   Culture   Final    NO GROWTH 1 DAY Performed at [facility], [address], [place], [place] [address]    Report Status PENDING  Incomplete     Scheduled Meds:  diazepam   2.5 mg Intravenous Q6H   enoxaparin  (LOVENOX ) injection  40 mg Subcutaneous Q24H   folic acid   1 mg Oral Daily   gabapentin   200 mg Oral TID   ketorolac   30 mg Intravenous Q6H   nicotine   21 mg Transdermal Daily   vitamin B-12  500 mcg Oral Daily   Continuous Infusions:  ceFEPime  (MAXIPIME ) IV 2 g (09/23/23 1245)    Procedures/Studies: MR PELVIS W WO CONTRAST Result Date: 09/22/2023 MR PELVIS WITHOUT THEN WITH IV CONTRAST COMPARISON: MRI lumbar spine CLINICAL HISTORY: Abnormal MRI lumbar spine. PULSE SEQUENCES: AX T1, Ax T2 FS, Cor T1, COR STIR & SMALL FOV COR PD FS with and without IV contrast FINDINGS: Bones: There is abnormal edema centered in the right SI joint suspicious for septic arthritis of the right SI joint. Clinical correlation. There is moderate edema identified in the adjacent intrapelvic muscles and extrapelvic muscles on the right. There is no focal enhancing  collection to suggest soft tissue abscess at the current time. Otherwise, the visualized pelvis and left SI joint are unremarkable. Proximal femurs are unremarkable. Limited evaluation of the intrapelvic structures demonstrate no  significant abnormality. There is likely a functional ovarian cyst on the right measuring 3.5 cm. IMPRESSION: Edema and inflammation centered in the right SI joint likely reflective of septic arthritis. There is moderate reactive edema in the intrapelvic and extrapelvic muscles. There is no focal drainable collection is suggestion abscess at the current time. Follow-up after conservative treatment recommended to ensure resolution. Electronically signed by: Norleen Satchel MD 09/22/2023 12:28 PM EDT RP Workstation: [id number]   MR THORACIC SPINE W WO CONTRAST Result Date: 09/20/2023 CLINICAL DATA:  Back pain.  Intravenous drug use. EXAM: MRI THORACIC AND LUMBAR SPINE WITHOUT AND WITH CONTRAST TECHNIQUE: Multiplanar and multiecho pulse sequences of the thoracic and lumbar spine were obtained without and with intravenous contrast. CONTRAST:  6mL GADAVIST  GADOBUTROL  1 MMOL/ML IV SOLN COMPARISON:  07/08/2013 FINDINGS: MRI THORACIC SPINE FINDINGS Alignment:  No malalignment. Vertebrae: Benign appearing hemangioma within the T7 vertebral body. No other finding. No evidence of bone infection. Cord:  Normal Paraspinal and other soft tissues: Normal Disc levels: No significant disc level finding. No stenosis of the canal or foramina. Minimal non-compressive disc bulges at T8-9 and T10-11, not likely of any significance. No evidence of facet arthropathy. MRI LUMBAR SPINE FINDINGS Segmentation:  5 lumbar type vertebral bodies. Alignment:  Normal Vertebrae: No fracture or focal bone lesion of significance. Insignificant small a meningioma within the L1 vertebral body. Conus medullaris: Extends to the L1-2 level and appears normal. No worrisome finding. Paraspinal and other soft tissues: Prominent bile duct and pancreatic duct. This may relate to previous cholecystectomy, but is not primarily or completely evaluated. Question if there is some edema and enhancement at the right ileo psoas junction. This could indicate myositis.  If there is concern regarding this possibility, consider pelvic MRI with and without contrast. Disc levels: No significant disc level finding. No sign of spinal infection. Minimal non-compressive disc bulge L3-4. No sign of septic facet arthropathy. IMPRESSION: 1. No evidence of spinal infection in the thoracic or lumbar region. 2. Question if there is some edema and enhancement at the right ileo psoas muscular junction. This could indicate myositis. If there is concern regarding this possibility, consider pelvic MRI with and without contrast. I do not think that the finding is definite based on the limited evaluation provided by this exam. a 3. Prominent bile duct and pancreatic duct. This may relate to previous cholecystectomy, but is not primarily or completely evaluated. Electronically Signed   By: Oneil Officer M.D.   On: 09/20/2023 16:37   MR Lumbar Spine W Wo Contrast Result Date: 09/20/2023 CLINICAL DATA:  Back pain.  Intravenous drug use. EXAM: MRI THORACIC AND LUMBAR SPINE WITHOUT AND WITH CONTRAST TECHNIQUE: Multiplanar and multiecho pulse sequences of the thoracic and lumbar spine were obtained without and with intravenous contrast. CONTRAST:  6mL GADAVIST  GADOBUTROL  1 MMOL/ML IV SOLN COMPARISON:  07/08/2013 FINDINGS: MRI THORACIC SPINE FINDINGS Alignment:  No malalignment. Vertebrae: Benign appearing hemangioma within the T7 vertebral body. No other finding. No evidence of bone infection. Cord:  Normal Paraspinal and other soft tissues: Normal Disc levels: No significant disc level finding. No stenosis of the canal or foramina. Minimal non-compressive disc bulges at T8-9 and T10-11, not likely of any significance. No evidence of facet arthropathy. MRI LUMBAR SPINE FINDINGS Segmentation:  5 lumbar type vertebral bodies.  Alignment:  Normal Vertebrae: No fracture or focal bone lesion of significance. Insignificant small a meningioma within the L1 vertebral body. Conus medullaris: Extends to the L1-2  level and appears normal. No worrisome finding. Paraspinal and other soft tissues: Prominent bile duct and pancreatic duct. This may relate to previous cholecystectomy, but is not primarily or completely evaluated. Question if there is some edema and enhancement at the right ileo psoas junction. This could indicate myositis. If there is concern regarding this possibility, consider pelvic MRI with and without contrast. Disc levels: No significant disc level finding. No sign of spinal infection. Minimal non-compressive disc bulge L3-4. No sign of septic facet arthropathy. IMPRESSION: 1. No evidence of spinal infection in the thoracic or lumbar region. 2. Question if there is some edema and enhancement at the right ileo psoas muscular junction. This could indicate myositis. If there is concern regarding this possibility, consider pelvic MRI with and without contrast. I do not think that the finding is definite based on the limited evaluation provided by this exam. a 3. Prominent bile duct and pancreatic duct. This may relate to previous cholecystectomy, but is not primarily or completely evaluated. Electronically Signed   By: Oneil Officer M.D.   On: 09/20/2023 16:37    Alm Schneider, DO  [facility]  If 7PM-7AM, please contact night-coverage [email or web address] Password [email or web address] 09/23/2023, 4:40 PM   LOS: 2 days

## 2023-09-23 NOTE — Plan of Care (Signed)
 Pt continues to experience severe pain in right hip and leg area. Daughter remains at bedside. Pt and daughter feels the Dilaudid  mg is to low and ineffective. Both stated something needs to be done so pt will not continue to be in constant pain. Advised pt and daughter to speak with the attending MD regarding concern. Will continue to support pt and daughter and monitor for any other signs of decline.   Problem: Health Behavior/Discharge Planning: Goal: Ability to manage health-related needs will improve Outcome: Not Met (add Reason)   Problem: Clinical Measurements: Goal: Will remain free from infection Outcome: Not Met (add Reason)   Problem: Activity: Goal: Risk for activity intolerance will decrease Outcome: Not Met (add Reason)   Problem: Coping: Goal: Level of anxiety will decrease Outcome: Not Met (add Reason)   Problem: Elimination: Goal: Will not experience complications related to bowel motility Outcome: Progressing Goal: Will not experience complications related to urinary retention Outcome: Progressing   Problem: Pain Managment: Goal: General experience of comfort will improve and/or be controlled Outcome: Not Met (add Reason)

## 2023-09-24 ENCOUNTER — Inpatient Hospital Stay (HOSPITAL_COMMUNITY): Payer: Self-pay

## 2023-09-24 ENCOUNTER — Other Ambulatory Visit (HOSPITAL_COMMUNITY): Payer: Self-pay | Admitting: *Deleted

## 2023-09-24 DIAGNOSIS — R7881 Bacteremia: Secondary | ICD-10-CM

## 2023-09-24 LAB — BASIC METABOLIC PANEL WITH GFR
Anion gap: 6 (ref 5–15)
BUN: 14 mg/dL (ref 6–20)
CO2: 28 mmol/L (ref 22–32)
Calcium: 8 mg/dL — ABNORMAL LOW (ref 8.9–10.3)
Chloride: 109 mmol/L (ref 98–111)
Creatinine, Ser: 0.84 mg/dL (ref 0.44–1.00)
GFR, Estimated: >60 mL/min (ref >60)
Glucose, Bld: 99 mg/dL (ref 70–99)
Potassium: 3.1 mmol/L — ABNORMAL LOW (ref 3.5–5.1)
Sodium: 143 mmol/L (ref 135–145)

## 2023-09-24 LAB — CBC
HCT: 28 % — ABNORMAL LOW (ref 36.0–46.0)
Hemoglobin: 9.4 g/dL — ABNORMAL LOW (ref 12.0–15.0)
MCH: 29 pg (ref 26.0–34.0)
MCHC: 33.6 g/dL (ref 30.0–36.0)
MCV: 86.4 fL (ref 80.0–100.0)
Platelets: 142 K/uL — ABNORMAL LOW (ref 150–400)
RBC: 3.24 MIL/uL — ABNORMAL LOW (ref 3.87–5.11)
RDW: 13.2 % (ref 11.5–15.5)
WBC: 3.9 K/uL — ABNORMAL LOW (ref 4.0–10.5)
nRBC: 0 % (ref 0.0–0.2)

## 2023-09-24 LAB — CULTURE, BLOOD (ROUTINE X 2): Special Requests: ADEQUATE

## 2023-09-24 LAB — ECHOCARDIOGRAM COMPLETE
AR max vel: 2.02 cm2
AV Area VTI: 2.2 cm2
AV Area mean vel: 2.17 cm2
AV Mean grad: 3.3 mmHg
AV Peak grad: 8 mmHg
Ao pk vel: 1.42 m/s
Area-P 1/2: 3.72 cm2
Height: 64 in
S' Lateral: 3 cm
Weight: 2215.18 [oz_av]

## 2023-09-24 LAB — MAGNESIUM: Magnesium: 1.9 mg/dL (ref 1.7–2.4)

## 2023-09-24 MED ORDER — SODIUM CHLORIDE 0.9 % IV SOLN
2.0000 g | Freq: Three times a day (TID) | INTRAVENOUS | Status: DC
  Administered 2023-09-24 (×2): 2 g via INTRAVENOUS
  Filled 2023-09-24 (×3): qty 12.5

## 2023-09-24 MED ORDER — POTASSIUM CHLORIDE CRYS ER 20 MEQ PO TBCR
40.0000 meq | EXTENDED_RELEASE_TABLET | Freq: Once | ORAL | Status: AC
Start: 2023-09-24 — End: 2023-09-24
  Administered 2023-09-24: 40 meq via ORAL
  Filled 2023-09-24: qty 2

## 2023-09-24 MED ORDER — LOSARTAN POTASSIUM 25 MG PO TABS
25.0000 mg | ORAL_TABLET | Freq: Every day | ORAL | Status: DC
  Administered 2023-09-24: 25 mg via ORAL
  Filled 2023-09-24: qty 1

## 2023-09-24 MED ORDER — CYANOCOBALAMIN 500 MCG PO TABS: 500.0000 ug | ORAL_TABLET | Freq: Every day | ORAL | Status: AC

## 2023-09-24 MED ORDER — FOLIC ACID 1 MG PO TABS: 1.0000 mg | ORAL_TABLET | Freq: Every day | ORAL | Status: DC

## 2023-09-24 NOTE — Discharge Summary (Signed)
 Physician Discharge Summary   Patient: Jamie Evans MRN: [id number] DOB: 03/10/1970  Admit date:     09/20/2023  Discharge date: 09/25/23  Discharge Physician: Alm Neil Errickson   PCP: [facility] (Inactive)   Recommendations at discharge:   Please follow up with primary care provider within 1-2 weeks  Please repeat BMP and CBC in one week    Hospital Course: 53 year old female with a history of substance use disorder with IV fentanyl , anxiety/depression, hypertension presenting with right lower back pain for 1 week.  The patient states that it has progressively worsened particularly with weightbearing of her right lower extremity.  She states that the pain radiates down her right leg.  She denies any recent injury or trauma.  The patient usually injects fentanyl  about 3 times a day.  She denies any other illicit drug use.  She smokes 1 to 2 packs/day.  She denies any alcohol use. She has not had any fevers, chills, chest pain, shortness breath, coughing, hemoptysis.  She denies any nausea, vomiting, diarrhea, dysuria, hematuria.  She denies any bladder or bowel incontinence. She states that she is able to bear weight but requires assistance to ambulate due to her back pain.  She has not fallen or lost consciousness.  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  In the ED, the patient was hemodynamically stable.  She had low-grade temperature of 99.9 F.  Oxygen saturation is 100% room air.  WBC 7.5, hemoglobin 12.2, platelets 132.  Sodium 141, potassium 3.2, bicarbonate 30, serum creatinine 0.1.  AST 27, ALT 24, alk phosphatase 173, total bilirubin 0.9.  Lactic acid 1.2.  MRI of the thoracic and lumbar spine was negative for any evidence of spinal infection.  There was a question of some edema and enhancement in the right iliopsoas muscular junction  which may indicate myositis.  MRI of the pelvis was recommended.  The patient was admitted for pain control as well as his to obtain MRI of the pelvis. MR pelvis showed Edema and inflammation centered in the right SI joint likely reflective of septic arthritis; moderate reactive edema in the intrapelvic and extrapelvic muscles.  Blood cultures grew Serratia marcescens that was pansensitive.  Patient was started on IV cefepime .  She showed continued clinical improvement with decreased pain and increased mobility.  On the day of d/c, patient had some n/v.  She was given antiemetics and pantoprazole .  She was given the opportunity to stay in hospital another day, but she stated she felt well enough for d/c.  Rx for pantoprazole  and zofran  ODT were provided  Assessment and Plan: Septic sacro-ilitis/Myositis - Suspect a degree of myositis - 09/20/2023 MRI T & L spine--negative for spinal infection; question some edema and enhancement of the right iliopsoas junction - 8/22 MRI pelvis--Edema and inflammation centered in the right SI joint likely reflective of septic arthritis; moderate reactive edema in the intrapelvic and extrapelvic muscles. - Continue judicious IV opioids - continue gabapentin  - started IV valium  - Check ESR 53 - CRP--16.2 - Follow blood cultures>>Serratia - 09/22/23 discussed with neurosurgery Katharina Beck with Dr. Mavis) and ortho (Dr. Elsa) >>>continue nonoperative management -Discussed with ID, Dr. Singh>>d/c home with Bactrim  DS x 28 days.  She will set up follow up for patient   Serratia Bacteremia -continue cefepime  -repeat blood cultures x 2--neg to date  Tobacco abuse - Tobacco cessation discussed - NicoDerm patch   Hypokalemia - Repleted - Check magnesium 1.9   Essential hypertension - Previously on losartan  - restart losartan  as BP trending up   Substance use disorder - Anticipate the patient has significant opioid tolerance - UDS--not sent   Low  B12/low folate -replace   Uncontrolled Pain -continue IV dilaudid  for now -continue valium  (primarily for as muscle relaxant) -continue toradol  -added gabapentin  -add amitriptyline  at hs   HCV antibody positive -check HCV RNA-pending at time of dc         Consultants: none Procedures performed: none  Disposition: Home Diet recommendation:  Regular diet DISCHARGE MEDICATION: Allergies as of 09/25/2023       Reactions   Hydrocodone Nausea And Vomiting   Penicillins Nausea Only        Medication List     TAKE these medications    amitriptyline  25 MG tablet Commonly known as: ELAVIL  Take 1 tablet (25 mg total) by mouth at bedtime.   cyanocobalamin  500 MCG tablet Commonly known as: VITAMIN B12 Take 1 tablet (500 mcg total) by mouth daily.   diazepam  5 MG tablet Commonly known as: VALIUM  Take 1 tablet (5 mg total) by mouth every 8 (eight) hours.   folic acid  1 MG tablet Commonly known as: FOLVITE  Take 1 tablet (1 mg total) by mouth daily.   gabapentin  300 MG capsule Commonly known as: NEURONTIN  Take 1 capsule (300 mg total) by mouth 3 (three) times daily.   ibuprofen  200 MG tablet Commonly known as: ADVIL  Take 600 mg by mouth every 6 (six) hours as needed for mild pain (pain score 1-3).   losartan  50 MG tablet Commonly known as: COZAAR  Take 1 tablet (50 mg total) by mouth daily. Start taking on: September 26, 2023 What changed:  medication strength how much to take   ondansetron  8 MG disintegrating tablet Commonly known as: ZOFRAN -ODT Take 1 tablet (8 mg total) by mouth every 8 (eight) hours as needed for nausea or vomiting.   oxyCODONE  5 MG immediate release tablet Commonly known as: Oxy IR/ROXICODONE  Take 1 tablet (5 mg total) by mouth every 4 (four) hours as needed for moderate pain (pain score 4-6).   pantoprazole  40 MG tablet Commonly known as: PROTONIX  Take 1 tablet (40 mg total) by mouth daily.   sulfamethoxazole -trimethoprim  800-160 MG  tablet Commonly known as: BACTRIM  DS Take 1 tablet by mouth every 12 (twelve) hours.        Discharge Exam: Filed Weights   09/20/23 1315 09/21/23 0500  Weight: 60.3 kg 62.8 kg   HEENT:  [place]/AT, No thrush, no icterus CV:  RRR, no rub, no S3, no S4 Lung:  CTA, no wheeze, no rhonchi Abd:  soft/+BS, NT Ext:  No edema, no lymphangitis, no synovitis, no rash   Condition at discharge: stable  The results of significant diagnostics from this hospitalization (including imaging, microbiology, ancillary and laboratory) are listed below for reference.   Imaging Studies: ECHOCARDIOGRAM COMPLETE Result Date: 09/24/2023    ECHOCARDIOGRAM REPORT   Patient Name:   ARIELA MOCHIZUKI Date of Exam: 09/24/2023 Medical Rec #:  [id number]       Height:       64.0 in Accession #:    [id number]      Weight:       138.4 lb Date of Birth:  Feb 09, 1970        BSA:          1.673 m  Patient Age:    53 years        BP:           162/102 mmHg Patient Gender: F               HR:           71 bpm. Exam [facility] Procedure: 2D Echo, Cardiac Doppler and Color Doppler (Both Spectral and Color            Flow Doppler were utilized during procedure). Indications:    Bacteremia R78.81  History:        Patient has prior history of Echocardiogram examinations, most                 recent 10/06/2015. Risk Factors:Current Smoker and Hypertension.                 Hx of substance abuse and IV drug abuse.  Sonographer:    Aida Pizza RCS Referring Phys: [phone] Dashonna Chagnon IMPRESSIONS  1. Left ventricular ejection fraction, by estimation, is 60 to 65%. The left ventricle has normal function. The left ventricle has no regional wall motion abnormalities. Left ventricular diastolic parameters were normal.  2. Right ventricular systolic function is normal. The right ventricular size is normal. Tricuspid regurgitation signal is inadequate for assessing PA pressure.  3. The mitral valve is normal in structure. Trivial mitral valve  regurgitation. No evidence of mitral stenosis.  4. The tricuspid valve is abnormal.  5. The aortic valve is tricuspid. Aortic valve regurgitation is not visualized. No aortic stenosis is present.  6. The inferior vena cava is dilated in size with >50% respiratory variability, suggesting right atrial pressure of 8 mmHg. FINDINGS  Left Ventricle: Left ventricular ejection fraction, by estimation, is 60 to 65%. The left ventricle has normal function. The left ventricle has no regional wall motion abnormalities. The left ventricular internal cavity size was normal in size. There is  no left ventricular hypertrophy. Left ventricular diastolic parameters were normal. Right Ventricle: The right ventricular size is normal. Right vetricular wall thickness was not well visualized. Right ventricular systolic function is normal. Tricuspid regurgitation signal is inadequate for assessing PA pressure. Left Atrium: Left atrial size was normal in size. Right Atrium: Right atrial size was normal in size. Pericardium: There is no evidence of pericardial effusion. Mitral Valve: The mitral valve is normal in structure. Trivial mitral valve regurgitation. No evidence of mitral valve stenosis. Tricuspid Valve: The tricuspid valve is abnormal. Tricuspid valve regurgitation is mild . No evidence of tricuspid stenosis. Aortic Valve: The aortic valve is tricuspid. Aortic valve regurgitation is not visualized. No aortic stenosis is present. Aortic valve mean gradient measures 3.3 mmHg. Aortic valve peak gradient measures 8.0 mmHg. Aortic valve area, by VTI measures 2.20 cm. Pulmonic Valve: The pulmonic valve was not well visualized. Pulmonic valve regurgitation is not visualized. No evidence of pulmonic stenosis. Aorta: The aortic root is normal in size and structure. Venous: The inferior vena cava is dilated in size with greater than 50% respiratory variability, suggesting right atrial pressure of 8 mmHg. IAS/Shunts: No atrial level shunt  detected by color flow Doppler.  LEFT VENTRICLE PLAX 2D LVIDd:         4.40 cm   Diastology LVIDs:         3.00 cm   LV e' medial:    7.96 cm/s LV PW:         1.00 cm   LV  E/e' medial:  14.1 LV IVS:        1.00 cm   LV e' lateral:   10.70 cm/s LVOT diam:     1.90 cm   LV E/e' lateral: 10.5 LV SV:         60 LV SV Index:   36 LVOT Area:     2.84 cm  RIGHT VENTRICLE RV S prime:     16.30 cm/s TAPSE (M-mode): 2.4 cm LEFT ATRIUM             Index        RIGHT ATRIUM           Index LA diam:        4.00 cm 2.39 cm/m   RA Area:     15.00 cm LA Vol (A2C):   71.0 ml 42.43 ml/m  RA Volume:   37.80 ml  22.59 ml/m LA Vol (A4C):   36.6 ml 21.87 ml/m LA Biplane Vol: 55.4 ml 33.11 ml/m  AORTIC VALVE AV Area (Vmax):    2.02 cm AV Area (Vmean):   2.17 cm AV Area (VTI):     2.20 cm AV Vmax:           141.68 cm/s AV Vmean:          81.725 cm/s AV VTI:            0.273 m AV Peak Grad:      8.0 mmHg AV Mean Grad:      3.3 mmHg LVOT Vmax:         101.00 cm/s LVOT Vmean:        62.600 cm/s LVOT VTI:          0.212 m LVOT/AV VTI ratio: 0.78  AORTA Ao Root diam: 3.00 cm MITRAL VALVE MV Area (PHT): 3.72 cm     SHUNTS MV Decel Time: 204 msec     Systemic VTI:  0.21 m MV E velocity: 112.00 cm/s  Systemic Diam: 1.90 cm MV A velocity: 94.30 cm/s MV E/A ratio:  1.19 Dorn Ross MD Electronically signed by Dorn Ross MD Signature Date/Time: 09/24/2023/3:59:03 PM    Final    MR PELVIS W WO CONTRAST Result Date: 09/22/2023 MR PELVIS WITHOUT THEN WITH IV CONTRAST COMPARISON: MRI lumbar spine CLINICAL HISTORY: Abnormal MRI lumbar spine. PULSE SEQUENCES: AX T1, Ax T2 FS, Cor T1, COR STIR & SMALL FOV COR PD FS with and without IV contrast FINDINGS: Bones: There is abnormal edema centered in the right SI joint suspicious for septic arthritis of the right SI joint. Clinical correlation. There is moderate edema identified in the adjacent intrapelvic muscles and extrapelvic muscles on the right. There is no focal enhancing collection  to suggest soft tissue abscess at the current time. Otherwise, the visualized pelvis and left SI joint are unremarkable. Proximal femurs are unremarkable. Limited evaluation of the intrapelvic structures demonstrate no significant abnormality. There is likely a functional ovarian cyst on the right measuring 3.5 cm. IMPRESSION: Edema and inflammation centered in the right SI joint likely reflective of septic arthritis. There is moderate reactive edema in the intrapelvic and extrapelvic muscles. There is no focal drainable collection is suggestion abscess at the current time. Follow-up after conservative treatment recommended to ensure resolution. Electronically signed by: Norleen Satchel MD 09/22/2023 12:28 PM EDT RP Workstation: [id number]   MR THORACIC SPINE W WO CONTRAST Result Date: 09/20/2023 CLINICAL DATA:  Back pain.  Intravenous drug use. EXAM: MRI THORACIC AND LUMBAR SPINE WITHOUT  AND WITH CONTRAST TECHNIQUE: Multiplanar and multiecho pulse sequences of the thoracic and lumbar spine were obtained without and with intravenous contrast. CONTRAST:  6mL GADAVIST  GADOBUTROL  1 MMOL/ML IV SOLN COMPARISON:  07/08/2013 FINDINGS: MRI THORACIC SPINE FINDINGS Alignment:  No malalignment. Vertebrae: Benign appearing hemangioma within the T7 vertebral body. No other finding. No evidence of bone infection. Cord:  Normal Paraspinal and other soft tissues: Normal Disc levels: No significant disc level finding. No stenosis of the canal or foramina. Minimal non-compressive disc bulges at T8-9 and T10-11, not likely of any significance. No evidence of facet arthropathy. MRI LUMBAR SPINE FINDINGS Segmentation:  5 lumbar type vertebral bodies. Alignment:  Normal Vertebrae: No fracture or focal bone lesion of significance. Insignificant small a meningioma within the L1 vertebral body. Conus medullaris: Extends to the L1-2 level and appears normal. No worrisome finding. Paraspinal and other soft tissues: Prominent bile duct and  pancreatic duct. This may relate to previous cholecystectomy, but is not primarily or completely evaluated. Question if there is some edema and enhancement at the right ileo psoas junction. This could indicate myositis. If there is concern regarding this possibility, consider pelvic MRI with and without contrast. Disc levels: No significant disc level finding. No sign of spinal infection. Minimal non-compressive disc bulge L3-4. No sign of septic facet arthropathy. IMPRESSION: 1. No evidence of spinal infection in the thoracic or lumbar region. 2. Question if there is some edema and enhancement at the right ileo psoas muscular junction. This could indicate myositis. If there is concern regarding this possibility, consider pelvic MRI with and without contrast. I do not think that the finding is definite based on the limited evaluation provided by this exam. a 3. Prominent bile duct and pancreatic duct. This may relate to previous cholecystectomy, but is not primarily or completely evaluated. Electronically Signed   By: Oneil Officer M.D.   On: 09/20/2023 16:37   MR Lumbar Spine W Wo Contrast Result Date: 09/20/2023 CLINICAL DATA:  Back pain.  Intravenous drug use. EXAM: MRI THORACIC AND LUMBAR SPINE WITHOUT AND WITH CONTRAST TECHNIQUE: Multiplanar and multiecho pulse sequences of the thoracic and lumbar spine were obtained without and with intravenous contrast. CONTRAST:  6mL GADAVIST  GADOBUTROL  1 MMOL/ML IV SOLN COMPARISON:  07/08/2013 FINDINGS: MRI THORACIC SPINE FINDINGS Alignment:  No malalignment. Vertebrae: Benign appearing hemangioma within the T7 vertebral body. No other finding. No evidence of bone infection. Cord:  Normal Paraspinal and other soft tissues: Normal Disc levels: No significant disc level finding. No stenosis of the canal or foramina. Minimal non-compressive disc bulges at T8-9 and T10-11, not likely of any significance. No evidence of facet arthropathy. MRI LUMBAR SPINE FINDINGS  Segmentation:  5 lumbar type vertebral bodies. Alignment:  Normal Vertebrae: No fracture or focal bone lesion of significance. Insignificant small a meningioma within the L1 vertebral body. Conus medullaris: Extends to the L1-2 level and appears normal. No worrisome finding. Paraspinal and other soft tissues: Prominent bile duct and pancreatic duct. This may relate to previous cholecystectomy, but is not primarily or completely evaluated. Question if there is some edema and enhancement at the right ileo psoas junction. This could indicate myositis. If there is concern regarding this possibility, consider pelvic MRI with and without contrast. Disc levels: No significant disc level finding. No sign of spinal infection. Minimal non-compressive disc bulge L3-4. No sign of septic facet arthropathy. IMPRESSION: 1. No evidence of spinal infection in the thoracic or lumbar region. 2. Question if there is some edema and  enhancement at the right ileo psoas muscular junction. This could indicate myositis. If there is concern regarding this possibility, consider pelvic MRI with and without contrast. I do not think that the finding is definite based on the limited evaluation provided by this exam. a 3. Prominent bile duct and pancreatic duct. This may relate to previous cholecystectomy, but is not primarily or completely evaluated. Electronically Signed   By: Oneil Officer M.D.   On: 09/20/2023 16:37    Microbiology: Results for orders placed or performed during the hospital encounter of 09/20/23  Blood culture (routine x 2)     Status: Abnormal   Collection Time: 09/20/23  1:52 PM   Specimen: BLOOD  Result Value Ref Range Status   Specimen Description   Final    BLOOD LEFT ANTECUBITAL Performed at [facility], [address] [place], [place] [address]    Special Requests   Final    BOTTLES DRAWN AEROBIC AND ANAEROBIC Blood Culture results may not be optimal due to an inadequate volume of blood received in culture  bottles Performed at [facility], [address] [place], [place] [address]    Culture  Setup Time   Final    GRAM NEGATIVE RODS IN BOTH AEROBIC AND ANAEROBIC BOTTLES Gram Stain Report Called to,Read Back By and Verified With: R TEJEDA AT 1635 ON [id number] BY S DALTON    Culture (A)  Final    SERRATIA MARCESCENS SUSCEPTIBILITIES PERFORMED ON PREVIOUS CULTURE WITHIN THE LAST 5 DAYS. Performed at [facility], [address], [place], [place] [address]    Report Status 09/24/2023 FINAL  Final  Blood culture (routine x 2)     Status: Abnormal   Collection Time: 09/20/23  2:07 PM   Specimen: BLOOD LEFT ARM  Result Value Ref Range Status   Specimen Description BLOOD LEFT ARM  Final   Special Requests AEROBIC BOTTLE ONLY Blood Culture adequate volume  Final   Culture  Setup Time   Final    GRAM NEGATIVE RODS AEROBIC BOTTLE ONLY Gram Stain Report Called to,Read Back By and Verified With: K. DAIL [id number], VIRAY,J CRITICAL RESULT CALLED TO, READ BACK BY AND VERIFIED WITH: PHARMD DEMPSEY BLUSH [id number] AT 1006 BY EC    Culture (A)  Final    SERRATIA MARCESCENS CRYPTOCOCCUS FROM BCID WAS NOT RECOVERED IN CULTURE Performed at [facility], [address], [place], [place] [address]    Report Status 09/24/2023 FINAL  Final   Organism ID, Bacteria SERRATIA MARCESCENS  Final      Susceptibility   Serratia marcescens - MIC*    CEFEPIME  <=0.12 SENSITIVE Sensitive     ERTAPENEM <=0.12 SENSITIVE Sensitive     CEFTRIAXONE  <=0.25 SENSITIVE Sensitive     CIPROFLOXACIN <=0.06 SENSITIVE Sensitive     GENTAMICIN <=1 SENSITIVE Sensitive     MEROPENEM <=0.25 SENSITIVE Sensitive     TRIMETH /SULFA  <=20 SENSITIVE Sensitive     * SERRATIA MARCESCENS  Blood Culture ID Panel (Reflexed)     Status: Abnormal   Collection Time: 09/20/23  2:07 PM  Result Value Ref Range Status   Enterococcus faecalis NOT DETECTED NOT DETECTED Final   Enterococcus Faecium NOT DETECTED NOT DETECTED Final    Listeria monocytogenes NOT DETECTED NOT DETECTED Final   Staphylococcus species NOT DETECTED NOT DETECTED Final   Staphylococcus aureus (BCID) NOT DETECTED NOT DETECTED Final   Staphylococcus epidermidis NOT DETECTED NOT DETECTED Final   Staphylococcus lugdunensis NOT DETECTED NOT DETECTED  Final   Streptococcus species NOT DETECTED NOT DETECTED Final   Streptococcus agalactiae NOT DETECTED NOT DETECTED Final   Streptococcus pneumoniae NOT DETECTED NOT DETECTED Final   Streptococcus pyogenes NOT DETECTED NOT DETECTED Final   A.calcoaceticus-baumannii NOT DETECTED NOT DETECTED Final   Bacteroides fragilis NOT DETECTED NOT DETECTED Final   Enterobacterales DETECTED (A) NOT DETECTED Final    Comment: Enterobacterales represent a large order of gram negative bacteria, not a single organism.   Enterobacter cloacae complex NOT DETECTED NOT DETECTED Final   Escherichia coli NOT DETECTED NOT DETECTED Final   Klebsiella aerogenes NOT DETECTED NOT DETECTED Final   Klebsiella oxytoca NOT DETECTED NOT DETECTED Final   Klebsiella pneumoniae NOT DETECTED NOT DETECTED Final   Proteus species NOT DETECTED NOT DETECTED Final   Salmonella species NOT DETECTED NOT DETECTED Final   Serratia marcescens DETECTED (A) NOT DETECTED Final    Comment: CRITICAL RESULT CALLED TO, READ BACK BY AND VERIFIED WITH: PHARMD DEMPSEY BLUSH [id number] AT 1006 BY EC    Haemophilus influenzae NOT DETECTED NOT DETECTED Final   Neisseria meningitidis NOT DETECTED NOT DETECTED Final   Pseudomonas aeruginosa NOT DETECTED NOT DETECTED Final   Stenotrophomonas maltophilia NOT DETECTED NOT DETECTED Final   Candida albicans NOT DETECTED NOT DETECTED Final   Candida auris NOT DETECTED NOT DETECTED Final   Candida glabrata NOT DETECTED NOT DETECTED Final   Candida krusei NOT DETECTED NOT DETECTED Final   Candida parapsilosis NOT DETECTED NOT DETECTED Final   Candida tropicalis NOT DETECTED NOT DETECTED Final   Cryptococcus  neoformans/gattii NOT DETECTED NOT DETECTED Final    Comment: CRITICAL RESULT CALLED TO, READ BACK BY AND VERIFIED WITH: PHARMD DEMPSEY BLUSH [id number] AT 1006 BY EC    CTX-M ESBL NOT DETECTED NOT DETECTED Final   Carbapenem resistance IMP NOT DETECTED NOT DETECTED Final   Carbapenem resistance KPC NOT DETECTED NOT DETECTED Final   Carbapenem resistance NDM NOT DETECTED NOT DETECTED Final   Carbapenem resist OXA 48 LIKE NOT DETECTED NOT DETECTED Final   Carbapenem resistance VIM NOT DETECTED NOT DETECTED Final    Comment: Performed at [facility], [address], [place], [place] [address]  Culture, blood (Routine X 2) w Reflex to ID Panel     Status: None (Preliminary result)   Collection Time: 09/22/23  3:52 AM   Specimen: Right Antecubital; Blood  Result Value Ref Range Status   Specimen Description RIGHT ANTECUBITAL  Final   Special Requests   Final    BOTTLES DRAWN AEROBIC AND ANAEROBIC Blood Culture adequate volume   Culture   Final    NO GROWTH 2 DAYS Performed at [facility], [address], [place], [place] [address]    Report Status PENDING  Incomplete  Culture, blood (Routine X 2) w Reflex to ID Panel     Status: None (Preliminary result)   Collection Time: 09/22/23  3:52 AM   Specimen: BLOOD RIGHT HAND  Result Value Ref Range Status   Specimen Description BLOOD RIGHT HAND  Final   Special Requests   Final    BOTTLES DRAWN AEROBIC AND ANAEROBIC Blood Culture adequate volume   Culture   Final    NO GROWTH 2 DAYS Performed at [facility], [address], [place], [place] [address]    Report Status PENDING  Incomplete    Labs: CBC: Recent Labs  Lab 09/20/23 1352 09/21/23 0841 09/22/23 0352 09/24/23 0447 09/25/23 0417  WBC 7.5 5.4 5.5 3.9* 5.1  NEUTROABS 6.0  --   --   --   --   HGB 12.2 11.5* 11.7* 9.4* 10.0*  HCT 35.6* 33.9* 34.0* 28.0* 30.7*  MCV 85.2 85.4 84.0 86.4 88.0  PLT 132* 123* 169 142* 164   Basic Metabolic Panel: Recent Labs  Lab  09/21/23 0841 09/22/23 0352 09/23/23 0331 09/24/23 0447 09/25/23 0417  NA 139 137 140 143 141  K 3.6 3.0* 4.0 3.1* 4.2  CL 98 99 104 109 104  CO2 28 25 29 28 28   GLUCOSE 92 103* 113* 99 95  BUN 12 17 16 14 17   CREATININE 0.72 0.88 0.94 0.84 0.82  CALCIUM  8.6* 8.5* 8.1* 8.0* 8.3*  MG 1.7 1.9 1.8 1.9 1.9   Liver Function Tests: Recent Labs  Lab 09/20/23 1352  AST 27  ALT 24  ALKPHOS 173*  BILITOT 0.9  PROT 7.0  ALBUMIN 2.8*   CBG: No results for input(s): GLUCAP in the last 168 hours.  Discharge time spent: greater than 30 minutes.  Signed: Alm Schneider, MD [facility] 09/25/2023

## 2023-09-24 NOTE — Progress Notes (Signed)
*  PRELIMINARY RESULTS* Echocardiogram 2D Echocardiogram has been performed.  Jamie Evans 09/24/2023, 3:38 PM

## 2023-09-24 NOTE — Progress Notes (Signed)
 PROGRESS NOTE  Jamie Evans FMW:[id number] DOB: [birth year] DOA: 09/20/2023 PCP: [facility] (Inactive)  Brief History:  53 year old female with a history of substance use disorder with IV fentanyl , anxiety/depression, hypertension presenting with right lower back pain for 1 week.  The patient states that it has progressively worsened particularly with weightbearing of her right lower extremity.  She states that the pain radiates down her right leg.  She denies any recent injury or trauma.  The patient usually injects fentanyl  about 3 times a day.  She denies any other illicit drug use.  She smokes 1 to 2 packs/day.  She denies any alcohol use. She has not had any fevers, chills, chest pain, shortness breath, coughing, hemoptysis.  She denies any nausea, vomiting, diarrhea, dysuria, hematuria.  She denies any bladder or bowel incontinence. She states that she is able to bear weight but requires assistance to ambulate due to her back pain.  She has not fallen or lost consciousness.  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  In the ED, the patient was hemodynamically stable.  She had low-grade temperature of 99.9 F.  Oxygen saturation is 100% room air.  WBC 7.5, hemoglobin 12.2, platelets 132.  Sodium 141, potassium 3.2, bicarbonate 30, serum creatinine 0.1.  AST 27, ALT 24, alk phosphatase 173, total bilirubin 0.9.  Lactic acid 1.2.  MRI of the thoracic and lumbar spine was negative for any evidence of spinal infection.  There was a question of some edema and enhancement in the right iliopsoas muscular junction which may indicate myositis.  MRI of the pelvis was recommended.  The patient was admitted for pain control as well as his to obtain MRI of the pelvis.   Assessment/Plan:  Septic sacro-ilitis/Myositis - Suspect a degree of  myositis - 09/20/2023 MRI T & L spine--negative for spinal infection; question some edema and enhancement of the right iliopsoas junction - 8/22 MRI pelvis--Edema and inflammation centered in the right SI joint likely reflective of septic arthritis; moderate reactive edema in the intrapelvic and extrapelvic muscles. - Continue judicious IV opioids - continue gabapentin  - started IV valium  - Check ESR 53 - CRP--16.2 - Follow blood cultures>>Serratia - 09/22/23 discussed with neurosurgery Katharina Beck with Dr. Mavis) and ortho (Dr. Elsa) >>>continue nonoperative management   Serratia Bacteremia -continue cefepime  -repeat blood cultures x 2--neg to date   Tobacco abuse - Tobacco cessation discussed - NicoDerm patch   Hypokalemia - Repleted - Check magnesium 1.9   Essential hypertension - Previously on losartan  - restart losartan  as BP trending up   Substance use disorder - Anticipate the patient has significant opioid tolerance - UDS   Low B12/low folate -replace   Uncontrolled Pain -continue IV dilaudid  for now -continue valium  (primarily for as muscle relaxant) -continue toradol  -added gabapentin  -add amitriptyline  at hs   HCV antibody positive -check HCV RNA                 Family Communication:   daughter at bedside 8/25   Consultants: phone with neurosurgery/ortho    Code Status:  FULL    DVT Prophylaxis:  [place] Lovenox      Procedures: As Listed in Progress Note Above   Antibiotics: Cefepime  8/22>>8/24 Cefepime  8/25>> Ceftriaxone  8/24         Subjective: Pt  complains of right buttock pain, severe although improving.  She is able to bear weight and ambulate.  Denies cp, sob, n/v/d, abd pain  Objective: Vitals:   09/23/23 1303 09/23/23 2033 09/24/23 0555 09/24/23 0843  BP: (!) 155/92 (!) 161/85 (!) 147/82 (!) 162/102  Pulse: 71 79 71   Resp: 18 18 18 20   Temp: 98.2 F (36.8 C) 97.9 F (36.6 C) (!) 97.5 F (36.4 C) 97.6 F (36.4  C)  TempSrc:  Oral Oral Oral  SpO2: 98% 99% 100% 99%  Weight:      Height:        Intake/Output Summary (Last 24 hours) at 09/24/2023 1054 Last data filed at 09/24/2023 0951 Gross per 24 hour  Intake 1540 ml  Output --  Net 1540 ml   Weight change:  Exam:  General:  Pt is alert, follows commands appropriately, not in acute distress HEENT: No icterus, No thrush, No neck mass, /AT Cardiovascular: RRR, S1/S2, no rubs, no gallops Respiratory: CTA bilaterally, no wheezing, no crackles, no rhonchi Abdomen: Soft/+BS, non tender, non distended, no guarding Extremities: No edema, No lymphangitis, No petechiae, No rashes, no synovitis Neuro:  CN II-XII intact, strength 4/5 in RUE,  4-/5 RLE, strength 4/5 LUE, LLE; sensation intact bilateral; no dysmetria; babinski equivocal    Data Reviewed: I have personally reviewed following labs and imaging studies Basic Metabolic Panel: Recent Labs  Lab 09/20/23 1352 09/21/23 0841 09/22/23 0352 09/23/23 0331 09/24/23 0447  NA 141 139 137 140 143  K 3.2* 3.6 3.0* 4.0 3.1*  CL 97* 98 99 104 109  CO2 30 28 25 29 28   GLUCOSE 109* 92 103* 113* 99  BUN 11 12 17 16 14   CREATININE 0.81 0.72 0.88 0.94 0.84  CALCIUM  8.8* 8.6* 8.5* 8.1* 8.0*  MG  --  1.7 1.9 1.8 1.9   Liver Function Tests: Recent Labs  Lab 09/20/23 1352  AST 27  ALT 24  ALKPHOS 173*  BILITOT 0.9  PROT 7.0  ALBUMIN 2.8*   No results for input(s): LIPASE, AMYLASE in the last 168 hours. No results for input(s): AMMONIA in the last 168 hours. Coagulation Profile: No results for input(s): INR, PROTIME in the last 168 hours. CBC: Recent Labs  Lab 09/20/23 1352 09/21/23 0841 09/22/23 0352 09/24/23 0447  WBC 7.5 5.4 5.5 3.9*  NEUTROABS 6.0  --   --   --   HGB 12.2 11.5* 11.7* 9.4*  HCT 35.6* 33.9* 34.0* 28.0*  MCV 85.2 85.4 84.0 86.4  PLT 132* 123* 169 142*   Cardiac Enzymes: Recent Labs  Lab 09/21/23 0841  CKTOTAL 16*   BNP: Invalid input(s):  POCBNP CBG: No results for input(s): GLUCAP in the last 168 hours. HbA1C: No results for input(s): HGBA1C in the last 72 hours. Urine analysis:    Component Value Date/Time   COLORURINE YELLOW 11/02/2015 0912   APPEARANCEUR CLEAR 11/02/2015 0912   APPEARANCEUR Cloudy (A) 05/19/2014 1503   LABSPEC 1.020 11/02/2015 0912   PHURINE 5.5 11/02/2015 0912   GLUCOSEU NEGATIVE 11/02/2015 0912   HGBUR NEGATIVE 11/02/2015 0912   BILIRUBINUR NEGATIVE 11/02/2015 0912   BILIRUBINUR Negative 05/19/2014 1503   KETONESUR NEGATIVE 11/02/2015 0912   PROTEINUR NEGATIVE 11/02/2015 0912   UROBILINOGEN 0.2 06/17/2008 2235   NITRITE NEGATIVE 11/02/2015 0912   LEUKOCYTESUR NEGATIVE 11/02/2015 0912   LEUKOCYTESUR 3+ (A) 05/19/2014 1503   Sepsis Labs: @LABRCNTIP (procalcitonin:4,lacticidven:4) ) Recent Results (from the past 240 hours)  Blood culture (routine x 2)  Status: Abnormal (Preliminary result)   Collection Time: 09/20/23  1:52 PM   Specimen: BLOOD  Result Value Ref Range Status   Specimen Description   Final    BLOOD LEFT ANTECUBITAL Performed at [facility], [address] [place], [place] [address]    Special Requests   Final    BOTTLES DRAWN AEROBIC AND ANAEROBIC Blood Culture results may not be optimal due to an inadequate volume of blood received in culture bottles Performed at [facility], [address], [place], [place] [address]    Culture  Setup Time   Final    GRAM NEGATIVE RODS IN BOTH AEROBIC AND ANAEROBIC BOTTLES Gram Stain Report Called to,Read Back By and Verified With: R TEJEDA AT 1635 ON [id number] BY S DALTON    Culture (A)  Final    SERRATIA MARCESCENS CULTURE REINCUBATED FOR BETTER GROWTH Performed at [facility], [address], [place], [place] [address]    Report Status PENDING  Incomplete  Blood culture (routine x 2)     Status: Abnormal (Preliminary result)   Collection Time: 09/20/23  2:07 PM   Specimen: BLOOD LEFT ARM  Result Value Ref  Range Status   Specimen Description BLOOD LEFT ARM  Final   Special Requests AEROBIC BOTTLE ONLY Blood Culture adequate volume  Final   Culture  Setup Time   Final    GRAM NEGATIVE RODS AEROBIC BOTTLE ONLY Gram Stain Report Called to,Read Back By and Verified With: K. DAIL [id number], VIRAY,J CRITICAL RESULT CALLED TO, READ BACK BY AND VERIFIED WITH: PHARMD DEMPSEY BLUSH [id number] AT 1006 BY EC    Culture (A)  Final    SERRATIA MARCESCENS CULTURE REINCUBATED FOR BETTER GROWTH Performed at [facility], [address] [place], [place] [address]    Report Status PENDING  Incomplete   Organism ID, Bacteria SERRATIA MARCESCENS  Final      Susceptibility   Serratia marcescens - MIC*    CEFEPIME  <=0.12 SENSITIVE Sensitive     ERTAPENEM <=0.12 SENSITIVE Sensitive     CEFTRIAXONE  <=0.25 SENSITIVE Sensitive     CIPROFLOXACIN <=0.06 SENSITIVE Sensitive     GENTAMICIN <=1 SENSITIVE Sensitive     MEROPENEM <=0.25 SENSITIVE Sensitive     TRIMETH /SULFA  <=20 SENSITIVE Sensitive     * SERRATIA MARCESCENS  Blood Culture ID Panel (Reflexed)     Status: Abnormal   Collection Time: 09/20/23  2:07 PM  Result Value Ref Range Status   Enterococcus faecalis NOT DETECTED NOT DETECTED Final   Enterococcus Faecium NOT DETECTED NOT DETECTED Final   Listeria monocytogenes NOT DETECTED NOT DETECTED Final   Staphylococcus species NOT DETECTED NOT DETECTED Final   Staphylococcus aureus (BCID) NOT DETECTED NOT DETECTED Final   Staphylococcus epidermidis NOT DETECTED NOT DETECTED Final   Staphylococcus lugdunensis NOT DETECTED NOT DETECTED Final   Streptococcus species NOT DETECTED NOT DETECTED Final   Streptococcus agalactiae NOT DETECTED NOT DETECTED Final   Streptococcus pneumoniae NOT DETECTED NOT DETECTED Final   Streptococcus pyogenes NOT DETECTED NOT DETECTED Final   A.calcoaceticus-baumannii NOT DETECTED NOT DETECTED Final   Bacteroides fragilis NOT DETECTED NOT DETECTED Final    Enterobacterales DETECTED (A) NOT DETECTED Final    Comment: Enterobacterales represent a large order of gram negative bacteria, not a single organism.   Enterobacter cloacae complex NOT DETECTED NOT DETECTED Final   Escherichia coli NOT DETECTED NOT DETECTED Final   Klebsiella aerogenes NOT DETECTED NOT DETECTED Final   Klebsiella oxytoca  NOT DETECTED NOT DETECTED Final   Klebsiella pneumoniae NOT DETECTED NOT DETECTED Final   Proteus species NOT DETECTED NOT DETECTED Final   Salmonella species NOT DETECTED NOT DETECTED Final   Serratia marcescens DETECTED (A) NOT DETECTED Final    Comment: CRITICAL RESULT CALLED TO, READ BACK BY AND VERIFIED WITH: PHARMD DEMPSEY BLUSH [id number] AT 1006 BY EC    Haemophilus influenzae NOT DETECTED NOT DETECTED Final   Neisseria meningitidis NOT DETECTED NOT DETECTED Final   Pseudomonas aeruginosa NOT DETECTED NOT DETECTED Final   Stenotrophomonas maltophilia NOT DETECTED NOT DETECTED Final   Candida albicans NOT DETECTED NOT DETECTED Final   Candida auris NOT DETECTED NOT DETECTED Final   Candida glabrata NOT DETECTED NOT DETECTED Final   Candida krusei NOT DETECTED NOT DETECTED Final   Candida parapsilosis NOT DETECTED NOT DETECTED Final   Candida tropicalis NOT DETECTED NOT DETECTED Final   Cryptococcus neoformans/gattii NOT DETECTED NOT DETECTED Final    Comment: CRITICAL RESULT CALLED TO, READ BACK BY AND VERIFIED WITH: PHARMD DEMPSEY BLUSH [id number] AT 1006 BY EC    CTX-M ESBL NOT DETECTED NOT DETECTED Final   Carbapenem resistance IMP NOT DETECTED NOT DETECTED Final   Carbapenem resistance KPC NOT DETECTED NOT DETECTED Final   Carbapenem resistance NDM NOT DETECTED NOT DETECTED Final   Carbapenem resist OXA 48 LIKE NOT DETECTED NOT DETECTED Final   Carbapenem resistance VIM NOT DETECTED NOT DETECTED Final    Comment: Performed at [facility], [address], [place], [place] [address]  Culture, blood (Routine X 2) w Reflex to ID Panel      Status: None (Preliminary result)   Collection Time: 09/22/23  3:52 AM   Specimen: Right Antecubital; Blood  Result Value Ref Range Status   Specimen Description RIGHT ANTECUBITAL  Final   Special Requests   Final    BOTTLES DRAWN AEROBIC AND ANAEROBIC Blood Culture adequate volume   Culture   Final    NO GROWTH 2 DAYS Performed at [facility], [address] [place], [place] [address]    Report Status PENDING  Incomplete  Culture, blood (Routine X 2) w Reflex to ID Panel     Status: None (Preliminary result)   Collection Time: 09/22/23  3:52 AM   Specimen: BLOOD RIGHT HAND  Result Value Ref Range Status   Specimen Description BLOOD RIGHT HAND  Final   Special Requests   Final    BOTTLES DRAWN AEROBIC AND ANAEROBIC Blood Culture adequate volume   Culture   Final    NO GROWTH 2 DAYS Performed at [facility], [address] [place], [place] [address]    Report Status PENDING  Incomplete     Scheduled Meds:  amitriptyline   25 mg Oral QHS   diazepam   2.5 mg Intravenous Q6H   enoxaparin  (LOVENOX ) injection  40 mg Subcutaneous Q24H   folic acid   1 mg Oral Daily   gabapentin   300 mg Oral TID   ketorolac   30 mg Intravenous Q6H   nicotine   21 mg Transdermal Daily   vitamin B-12  500 mcg Oral Daily   Continuous Infusions:  cefTRIAXone  (ROCEPHIN )  IV Stopped (09/23/23 2245)    Procedures/Studies: MR PELVIS W WO CONTRAST Result Date: 09/22/2023 MR PELVIS WITHOUT THEN WITH IV CONTRAST COMPARISON: MRI lumbar spine CLINICAL HISTORY: Abnormal MRI lumbar spine. PULSE SEQUENCES: AX T1, Ax T2 FS, Cor T1, COR STIR & SMALL FOV COR PD FS with and without IV contrast FINDINGS: Bones:  There is abnormal edema centered in the right SI joint suspicious for septic arthritis of the right SI joint. Clinical correlation. There is moderate edema identified in the adjacent intrapelvic muscles and extrapelvic muscles on the right. There is no focal enhancing collection to suggest soft tissue abscess at the  current time. Otherwise, the visualized pelvis and left SI joint are unremarkable. Proximal femurs are unremarkable. Limited evaluation of the intrapelvic structures demonstrate no significant abnormality. There is likely a functional ovarian cyst on the right measuring 3.5 cm. IMPRESSION: Edema and inflammation centered in the right SI joint likely reflective of septic arthritis. There is moderate reactive edema in the intrapelvic and extrapelvic muscles. There is no focal drainable collection is suggestion abscess at the current time. Follow-up after conservative treatment recommended to ensure resolution. Electronically signed by: Norleen Satchel MD 09/22/2023 12:28 PM EDT RP Workstation: [id number]   MR THORACIC SPINE W WO CONTRAST Result Date: 09/20/2023 CLINICAL DATA:  Back pain.  Intravenous drug use. EXAM: MRI THORACIC AND LUMBAR SPINE WITHOUT AND WITH CONTRAST TECHNIQUE: Multiplanar and multiecho pulse sequences of the thoracic and lumbar spine were obtained without and with intravenous contrast. CONTRAST:  6mL GADAVIST  GADOBUTROL  1 MMOL/ML IV SOLN COMPARISON:  07/08/2013 FINDINGS: MRI THORACIC SPINE FINDINGS Alignment:  No malalignment. Vertebrae: Benign appearing hemangioma within the T7 vertebral body. No other finding. No evidence of bone infection. Cord:  Normal Paraspinal and other soft tissues: Normal Disc levels: No significant disc level finding. No stenosis of the canal or foramina. Minimal non-compressive disc bulges at T8-9 and T10-11, not likely of any significance. No evidence of facet arthropathy. MRI LUMBAR SPINE FINDINGS Segmentation:  5 lumbar type vertebral bodies. Alignment:  Normal Vertebrae: No fracture or focal bone lesion of significance. Insignificant small a meningioma within the L1 vertebral body. Conus medullaris: Extends to the L1-2 level and appears normal. No worrisome finding. Paraspinal and other soft tissues: Prominent bile duct and pancreatic duct. This may relate to  previous cholecystectomy, but is not primarily or completely evaluated. Question if there is some edema and enhancement at the right ileo psoas junction. This could indicate myositis. If there is concern regarding this possibility, consider pelvic MRI with and without contrast. Disc levels: No significant disc level finding. No sign of spinal infection. Minimal non-compressive disc bulge L3-4. No sign of septic facet arthropathy. IMPRESSION: 1. No evidence of spinal infection in the thoracic or lumbar region. 2. Question if there is some edema and enhancement at the right ileo psoas muscular junction. This could indicate myositis. If there is concern regarding this possibility, consider pelvic MRI with and without contrast. I do not think that the finding is definite based on the limited evaluation provided by this exam. a 3. Prominent bile duct and pancreatic duct. This may relate to previous cholecystectomy, but is not primarily or completely evaluated. Electronically Signed   By: Oneil Officer M.D.   On: 09/20/2023 16:37   MR Lumbar Spine W Wo Contrast Result Date: 09/20/2023 CLINICAL DATA:  Back pain.  Intravenous drug use. EXAM: MRI THORACIC AND LUMBAR SPINE WITHOUT AND WITH CONTRAST TECHNIQUE: Multiplanar and multiecho pulse sequences of the thoracic and lumbar spine were obtained without and with intravenous contrast. CONTRAST:  6mL GADAVIST  GADOBUTROL  1 MMOL/ML IV SOLN COMPARISON:  07/08/2013 FINDINGS: MRI THORACIC SPINE FINDINGS Alignment:  No malalignment. Vertebrae: Benign appearing hemangioma within the T7 vertebral body. No other finding. No evidence of bone infection. Cord:  Normal Paraspinal and other soft tissues: Normal Disc  levels: No significant disc level finding. No stenosis of the canal or foramina. Minimal non-compressive disc bulges at T8-9 and T10-11, not likely of any significance. No evidence of facet arthropathy. MRI LUMBAR SPINE FINDINGS Segmentation:  5 lumbar type vertebral bodies.  Alignment:  Normal Vertebrae: No fracture or focal bone lesion of significance. Insignificant small a meningioma within the L1 vertebral body. Conus medullaris: Extends to the L1-2 level and appears normal. No worrisome finding. Paraspinal and other soft tissues: Prominent bile duct and pancreatic duct. This may relate to previous cholecystectomy, but is not primarily or completely evaluated. Question if there is some edema and enhancement at the right ileo psoas junction. This could indicate myositis. If there is concern regarding this possibility, consider pelvic MRI with and without contrast. Disc levels: No significant disc level finding. No sign of spinal infection. Minimal non-compressive disc bulge L3-4. No sign of septic facet arthropathy. IMPRESSION: 1. No evidence of spinal infection in the thoracic or lumbar region. 2. Question if there is some edema and enhancement at the right ileo psoas muscular junction. This could indicate myositis. If there is concern regarding this possibility, consider pelvic MRI with and without contrast. I do not think that the finding is definite based on the limited evaluation provided by this exam. a 3. Prominent bile duct and pancreatic duct. This may relate to previous cholecystectomy, but is not primarily or completely evaluated. Electronically Signed   By: Oneil Officer M.D.   On: 09/20/2023 16:37    Alm Schneider, DO  [facility]  If 7PM-7AM, please contact night-coverage [email or web address] Password [email or web address] 09/24/2023, 10:54 AM   LOS: 3 days

## 2023-09-24 NOTE — Evaluation (Signed)
 Physical Therapy Evaluation Patient Details Name: Jamie Evans MRN: [id number] DOB: [birth year] Today's Date: 09/24/2023  History of Present Illness  Jamie Evans is a 53 y.o. female with medical history significant for IV fentanyl  drug abuse, hypertension, anxiety and depression.  Patient presented to the ED with complaints of right low back pain radiating down to her right leg.  Symptoms started 6 days ago.  She is unable to ambulate.  She is an active IV fentanyl  drug user, last use was this morning.  On my evaluation, patient is sleeping, status post Dilaudid , arouses, answers questions and falls back asleep.  Daughter Jamie Evans is at bedside.  Patient denies difficulty breathing.  No chest pain.  Does not drink alcohol.  Smokes 1 to 2 packs of cigarettes daily.   Clinical Impression  Patient functioning near baseline for functional mobility and gait demonstrating ability to safely complete bed mobility, functional transfers, and ambulation trial without use of AD. Patient demonstrates good return for community ambulation without signs of instability or distress but does present with antalgic gait pattern that does not appear to exacerbated with walking. Patient discharged to care of nursing for ambulation daily as tolerated for length of stay.         If plan is discharge home, recommend the following: Help with stairs or ramp for entrance;Assist for transportation   Can travel by private vehicle        Equipment Recommendations None recommended by PT  Recommendations for Other Services       Functional Status Assessment Patient has not had a recent decline in their functional status     Precautions / Restrictions Restrictions Weight Bearing Restrictions Per Provider Order: No      Mobility  Bed Mobility Overal bed mobility: Independent                  Transfers Overall transfer level: Independent Equipment used: None               General transfer  comment: Demonstrates good return for safely completing functional transfers.    Ambulation/Gait Ambulation/Gait assistance: Independent Gait Distance (Feet): 100 Feet Assistive device: None Gait Pattern/deviations: Antalgic, Decreased step length - right, Decreased step length - left Gait velocity: WFL     General Gait Details: Pt presents with antalgic gait but reports no increase in pain from BL with ambulation, demonstrates good return for community ambulation without LOB.  Stairs            Wheelchair Mobility     Tilt Bed    Modified Rankin (Stroke Patients Only)       Balance Overall balance assessment: Mild deficits observed, not formally tested                                           Pertinent Vitals/Pain Pain Assessment Pain Assessment: 0-10 Pain Score: 7  Pain Intervention(s): Monitored during session, Patient requesting pain meds-RN notified    Home Living Family/patient expects to be discharged to:: Private residence Living Arrangements: Alone Available Help at Discharge: Family;Available 24 hours/day Type of Home: Mobile home Home Access: Stairs to enter Entrance Stairs-Rails: Can reach both Entrance Stairs-Number of Steps: 3   Home Layout: One level Home Equipment: None Additional Comments: Pt reports she will live with daughter temporarily following d/c but otherwise lives alone    Prior Function Prior Level  of Function : Independent/Modified Independent;Driving             Mobility Comments: Pt reports being household and community ambulator without AD ADLs Comments: Pt reports independence with all ADLs     Extremity/Trunk Assessment        Lower Extremity Assessment Lower Extremity Assessment: Overall WFL for tasks assessed    Cervical / Trunk Assessment Cervical / Trunk Assessment: Normal  Communication   Communication Communication: No apparent difficulties    Cognition Arousal: Alert Behavior  During Therapy: WFL for tasks assessed/performed   PT - Cognitive impairments: No apparent impairments                         Following commands: Intact       Cueing Cueing Techniques: Verbal cues     General Comments      Exercises     Assessment/Plan    PT Assessment Patient does not need any further PT services  PT Problem List         PT Treatment Interventions      PT Goals (Current goals can be found in the Care Plan section)  Acute Rehab PT Goals Patient Stated Goal: return home PT Goal Formulation: With patient/family Time For Goal Achievement: 09/24/23 Potential to Achieve Goals: Good    Frequency       Co-evaluation               AM-PAC PT 6 Clicks Mobility  Outcome Measure Help needed turning from your back to your side while in a flat bed without using bedrails?: None Help needed moving from lying on your back to sitting on the side of a flat bed without using bedrails?: None Help needed moving to and from a bed to a chair (including a wheelchair)?: None Help needed standing up from a chair using your arms (e.g., wheelchair or bedside chair)?: None Help needed to walk in hospital room?: None Help needed climbing 3-5 steps with a railing? : A Little 6 Click Score: 23    End of Session Equipment Utilized During Treatment: Gait belt Activity Tolerance: Patient tolerated treatment well;No increased pain Patient left: in chair;with family/visitor present;with call bell/phone within reach Nurse Communication: Mobility status PT Visit Diagnosis: Unsteadiness on feet (R26.81);Pain;Muscle weakness (generalized) (M62.81) Pain - Right/Left: Right Pain - part of body: Hip (back pain)    Time: 9079-9058 PT Time Calculation (min) (ACUTE ONLY): 21 min   Charges:   PT Evaluation $PT Eval Moderate Complexity: 1 Mod PT Treatments $Therapeutic Activity: 8-22 mins PT General Charges $$ ACUTE PT VISIT: 1 Visit         12:12 PM,  09/24/23,  Charmeka [place]burg, SPT

## 2023-09-25 ENCOUNTER — Other Ambulatory Visit (HOSPITAL_COMMUNITY): Payer: Self-pay

## 2023-09-25 DIAGNOSIS — M4658 Other infective spondylopathies, sacral and sacrococcygeal region: Secondary | ICD-10-CM

## 2023-09-25 LAB — CBC
HCT: 30.7 % — ABNORMAL LOW (ref 36.0–46.0)
Hemoglobin: 10 g/dL — ABNORMAL LOW (ref 12.0–15.0)
MCH: 28.7 pg (ref 26.0–34.0)
MCHC: 32.6 g/dL (ref 30.0–36.0)
MCV: 88 fL (ref 80.0–100.0)
Platelets: 164 K/uL (ref 150–400)
RBC: 3.49 MIL/uL — ABNORMAL LOW (ref 3.87–5.11)
RDW: 13.2 % (ref 11.5–15.5)
WBC: 5.1 K/uL (ref 4.0–10.5)
nRBC: 0 % (ref 0.0–0.2)

## 2023-09-25 LAB — C-REACTIVE PROTEIN: CRP: 2.2 mg/dL — ABNORMAL HIGH (ref <1.0)

## 2023-09-25 LAB — BASIC METABOLIC PANEL WITH GFR
Anion gap: 9 (ref 5–15)
BUN: 17 mg/dL (ref 6–20)
CO2: 28 mmol/L (ref 22–32)
Calcium: 8.3 mg/dL — ABNORMAL LOW (ref 8.9–10.3)
Chloride: 104 mmol/L (ref 98–111)
Creatinine, Ser: 0.82 mg/dL (ref 0.44–1.00)
GFR, Estimated: >60 mL/min (ref >60)
Glucose, Bld: 95 mg/dL (ref 70–99)
Potassium: 4.2 mmol/L (ref 3.5–5.1)
Sodium: 141 mmol/L (ref 135–145)

## 2023-09-25 LAB — MAGNESIUM: Magnesium: 1.9 mg/dL (ref 1.7–2.4)

## 2023-09-25 MED ORDER — LOSARTAN POTASSIUM 50 MG PO TABS
50.0000 mg | ORAL_TABLET | Freq: Every day | ORAL | Status: DC
  Administered 2023-09-25: 50 mg via ORAL
  Filled 2023-09-25: qty 1

## 2023-09-25 MED ORDER — ONDANSETRON 4 MG PO TBDP
8.0000 mg | ORAL_TABLET | Freq: Once | ORAL | Status: AC
  Administered 2023-09-25: 8 mg via ORAL
  Filled 2023-09-25: qty 2

## 2023-09-25 MED ORDER — DIAZEPAM 5 MG PO TABS: 5.0000 mg | ORAL_TABLET | Freq: Three times a day (TID) | ORAL | 0 refills | Status: AC

## 2023-09-25 MED ORDER — GABAPENTIN 300 MG PO CAPS: 300.0000 mg | ORAL_CAPSULE | Freq: Three times a day (TID) | ORAL | 0 refills | Status: DC

## 2023-09-25 MED ORDER — PANTOPRAZOLE SODIUM 40 MG PO TBEC
40.0000 mg | DELAYED_RELEASE_TABLET | Freq: Every day | ORAL | 0 refills | Status: DC
Start: 2023-09-25 — End: 2023-10-08

## 2023-09-25 MED ORDER — OXYCODONE HCL 5 MG PO TABS: 5.0000 mg | ORAL_TABLET | ORAL | 0 refills | Status: DC | PRN

## 2023-09-25 MED ORDER — SULFAMETHOXAZOLE-TRIMETHOPRIM 800-160 MG PO TABS: 1.0000 | ORAL_TABLET | Freq: Two times a day (BID) | ORAL | 0 refills | Status: DC

## 2023-09-25 MED ORDER — LOSARTAN POTASSIUM 50 MG PO TABS: 50.0000 mg | ORAL_TABLET | Freq: Every day | ORAL | 0 refills | Status: DC

## 2023-09-25 MED ORDER — PANTOPRAZOLE SODIUM 40 MG PO TBEC: 40.0000 mg | DELAYED_RELEASE_TABLET | Freq: Every day | ORAL | Status: DC

## 2023-09-25 MED ORDER — ONDANSETRON 4 MG PO TBDP: 8.0000 mg | ORAL_TABLET | Freq: Three times a day (TID) | ORAL | Status: DC | PRN

## 2023-09-25 MED ORDER — OXYCODONE HCL 5 MG PO TABS
5.0000 mg | ORAL_TABLET | ORAL | Status: DC | PRN
  Administered 2023-09-25: 5 mg via ORAL
  Filled 2023-09-25: qty 1

## 2023-09-25 MED ORDER — GABAPENTIN 300 MG PO CAPS: 300.0000 mg | ORAL_CAPSULE | Freq: Three times a day (TID) | ORAL | 0 refills | Status: AC

## 2023-09-25 MED ORDER — OXYCODONE HCL 5 MG PO TABS
5.0000 mg | ORAL_TABLET | Freq: Once | ORAL | Status: AC | PRN
  Administered 2023-09-25: 5 mg via ORAL
  Filled 2023-09-25: qty 1

## 2023-09-25 MED ORDER — AMITRIPTYLINE HCL 25 MG PO TABS: 25.0000 mg | ORAL_TABLET | Freq: Every day | ORAL | 0 refills | Status: AC

## 2023-09-25 MED ORDER — ONDANSETRON 8 MG PO TBDP: 8.0000 mg | ORAL_TABLET | Freq: Three times a day (TID) | ORAL | 0 refills | Status: AC | PRN

## 2023-09-25 MED ORDER — DIAZEPAM 5 MG PO TABS
5.0000 mg | ORAL_TABLET | Freq: Three times a day (TID) | ORAL | Status: DC
  Administered 2023-09-25: 5 mg via ORAL
  Filled 2023-09-25: qty 1

## 2023-09-25 MED ORDER — SULFAMETHOXAZOLE-TRIMETHOPRIM 800-160 MG PO TABS
1.0000 | ORAL_TABLET | Freq: Two times a day (BID) | ORAL | Status: DC
  Administered 2023-09-25: 1 via ORAL
  Filled 2023-09-25: qty 1

## 2023-09-25 MED ORDER — OXYCODONE HCL 5 MG PO TABS: 5.0000 mg | ORAL_TABLET | ORAL | 0 refills | Status: AC | PRN

## 2023-09-25 NOTE — TOC Progression Note (Signed)
 Transition of Care [facility]) - Progression Note    Patient Details  Name: Jamie Evans MRN: [id number] Date of Birth: 1971/01/18  Transition of Care [facility]) CM/SW Contact  Jamie Stabs, RN Phone Number: 09/25/2023, 11:49 AM  Clinical Narrative:   [facility] consulted for [facility]. Copy given to patient and explained.  Copy below.         Barriers to Discharge: Barriers Resolved    Expected Discharge Plan and Services      Expected Discharge Date: 09/25/23                 [facility] MEDICATION ASSISTANCE CARD Pharmacies please call [phone] for claim processing assistance.  Rx BIN: [id number] Rx Group: [id number] Rx PCN: [id number] Relationship Code: 1 Person Code: [id number]  Patient ID (MRN): MOSES    MRN: [id number]     Patient Name:  Jamie Evans   Patient DOB:  1970-03-27   Discharge Date: 09/25/2023  Expiration Date: 10/03/2023 (must be filled within 7 days of discharge)   Dear  Jamie Evans You have been approved to have the prescriptions written by your discharging physician filled through our [facility] (Medication Assistance Through [facility]) program. This program allows for a one-time (no refills) 34-day supply of selected medications for a low copay amount.  The copay is $3.00 per prescription. For instance, if you have one prescription, you will pay $3.00; for two prescriptions, you pay $6.00; for three prescriptions, you pay $9.00; and so on. Only certain pharmacies are participating in this program with [place] [facility]. You will need to select one of the pharmacies from the attached lists and take your prescriptions, this letter, and your photo ID to one of the participating pharmacies.  We are excited that you are able to use the [facility] program to get your medications. These prescriptions must be filled within 7 days of hospital discharge or they will no longer be valid for the [facility] program. Should you have any problems with your prescriptions please contact your case management  team member at [phone] for Jamie Evans/Jamie Evans or [phone] for Jamie Evans.  Thank you, Jamie Evans

## 2023-09-25 NOTE — Plan of Care (Signed)

## 2023-09-26 LAB — HCV RNA QUANT RFLX ULTRA OR GENOTYP
HCV RNA Qnt(log copy/mL): 6.767 {Log_IU}/mL
HepC Qn: 5850000 [IU]/mL

## 2023-09-26 LAB — HEPATITIS C GENOTYPE: Hepatitis C Genotype: 3
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
## 2023-09-27 ENCOUNTER — Ambulatory Visit: Payer: Self-pay | Admitting: Physician Assistant

## 2023-09-27 ENCOUNTER — Encounter: Payer: Self-pay | Admitting: Physician Assistant

## 2023-09-27 ENCOUNTER — Telehealth: Payer: Self-pay

## 2023-09-27 VITALS — BP 116/91 | HR 108 | Temp 97.8°F | Ht 62.75 in | Wt 135.5 lb

## 2023-09-27 DIAGNOSIS — Z7689 Persons encountering health services in other specified circumstances: Secondary | ICD-10-CM

## 2023-09-27 DIAGNOSIS — F1990 Other psychoactive substance use, unspecified, uncomplicated: Secondary | ICD-10-CM

## 2023-09-27 DIAGNOSIS — D649 Anemia, unspecified: Secondary | ICD-10-CM

## 2023-09-27 DIAGNOSIS — A498 Other bacterial infections of unspecified site: Secondary | ICD-10-CM

## 2023-09-27 DIAGNOSIS — F419 Anxiety disorder, unspecified: Secondary | ICD-10-CM

## 2023-09-27 DIAGNOSIS — I10 Essential (primary) hypertension: Secondary | ICD-10-CM

## 2023-09-27 DIAGNOSIS — M609 Myositis, unspecified: Secondary | ICD-10-CM

## 2023-09-27 DIAGNOSIS — F17200 Nicotine dependence, unspecified, uncomplicated: Secondary | ICD-10-CM

## 2023-09-27 LAB — CULTURE, BLOOD (ROUTINE X 2)
Culture: NO GROWTH
Culture: NO GROWTH
Special Requests: ADEQUATE
Special Requests: ADEQUATE

## 2023-09-27 NOTE — Telephone Encounter (Signed)
 Called for follow up [facility]. Client reports that visit went well and the provider did provide her with a work note as requested to be out of work for 1 month. No changes in her medications today. Noted that her blood pressure was much improved today. Inquired if she was able to check into getting MH services for her anxiety and depression. She states not today after getting her out of work note, she sent it to her supervisor and she received a text message from her supervisor stating she was terminated fired and once she is well, should could reapply for her job. She states that after receiving that text message she was too upset to check on the other services. Discussed that she should follow up for services maybe tomorrow.  She is aware of all her next appointments and that she needs follow up blood work next week.  Will plan to follow up with client as needed.  Avelina JONELLE Skeen RN [employer]
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
## 2023-09-27 NOTE — Patient Instructions (Signed)
 Blood work- Thursday or Friday September 4 or 5

## 2023-10-05 NOTE — Congregational Nurse Program (Signed)
 Attempted to complete Hospital Follow, however in review of [facility] EMR. It was found that Avelina MATSU, RN previously completed H/U follow up on 8.28.25  with patient Successfully, in which my followup is no longer needed
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
## 2023-10-08 ENCOUNTER — Other Ambulatory Visit: Payer: Self-pay

## 2023-10-08 ENCOUNTER — Inpatient Hospital Stay (HOSPITAL_COMMUNITY)
Admission: EM | Admit: 2023-10-08 | Discharge: 2023-10-10 | DRG: 683 | Disposition: A | Discharge status: Home / Self Care | Payer: Self-pay | Source: Ambulatory Visit | Attending: Family Medicine | Admitting: Family Medicine

## 2023-10-08 ENCOUNTER — Encounter (HOSPITAL_COMMUNITY): Payer: Self-pay

## 2023-10-08 ENCOUNTER — Emergency Department (HOSPITAL_COMMUNITY): Payer: Self-pay

## 2023-10-08 ENCOUNTER — Encounter: Payer: Self-pay | Admitting: Physician Assistant

## 2023-10-08 ENCOUNTER — Ambulatory Visit: Payer: Self-pay | Admitting: Physician Assistant

## 2023-10-08 ENCOUNTER — Other Ambulatory Visit (HOSPITAL_COMMUNITY)
Admission: RE | Admit: 2023-10-08 | Discharge: 2023-10-08 | Disposition: A | Discharge status: Home / Self Care | Payer: Self-pay | Source: Ambulatory Visit | Attending: Physician Assistant | Admitting: Physician Assistant

## 2023-10-08 VITALS — BP 150/105 | HR 99 | Temp 98.6°F

## 2023-10-08 DIAGNOSIS — Z833 Family history of diabetes mellitus: Secondary | ICD-10-CM

## 2023-10-08 DIAGNOSIS — K5903 Drug induced constipation: Secondary | ICD-10-CM

## 2023-10-08 DIAGNOSIS — E875 Hyperkalemia: Secondary | ICD-10-CM | POA: Diagnosis present

## 2023-10-08 DIAGNOSIS — Z823 Family history of stroke: Secondary | ICD-10-CM

## 2023-10-08 DIAGNOSIS — M609 Myositis, unspecified: Secondary | ICD-10-CM | POA: Diagnosis present

## 2023-10-08 DIAGNOSIS — E860 Dehydration: Secondary | ICD-10-CM | POA: Diagnosis present

## 2023-10-08 DIAGNOSIS — F32A Depression, unspecified: Secondary | ICD-10-CM | POA: Diagnosis present

## 2023-10-08 DIAGNOSIS — Z809 Family history of malignant neoplasm, unspecified: Secondary | ICD-10-CM

## 2023-10-08 DIAGNOSIS — D649 Anemia, unspecified: Secondary | ICD-10-CM | POA: Diagnosis present

## 2023-10-08 DIAGNOSIS — R109 Unspecified abdominal pain: Secondary | ICD-10-CM | POA: Diagnosis present

## 2023-10-08 DIAGNOSIS — Z825 Family history of asthma and other chronic lower respiratory diseases: Secondary | ICD-10-CM

## 2023-10-08 DIAGNOSIS — Z8619 Personal history of other infectious and parasitic diseases: Secondary | ICD-10-CM

## 2023-10-08 DIAGNOSIS — R110 Nausea: Secondary | ICD-10-CM

## 2023-10-08 DIAGNOSIS — Z885 Allergy status to narcotic agent status: Secondary | ICD-10-CM

## 2023-10-08 DIAGNOSIS — Z880 Allergy status to penicillin: Secondary | ICD-10-CM

## 2023-10-08 DIAGNOSIS — F419 Anxiety disorder, unspecified: Secondary | ICD-10-CM | POA: Diagnosis present

## 2023-10-08 DIAGNOSIS — Z79899 Other long term (current) drug therapy: Secondary | ICD-10-CM

## 2023-10-08 DIAGNOSIS — B182 Chronic viral hepatitis C: Secondary | ICD-10-CM | POA: Diagnosis present

## 2023-10-08 DIAGNOSIS — E538 Deficiency of other specified B group vitamins: Secondary | ICD-10-CM | POA: Diagnosis present

## 2023-10-08 DIAGNOSIS — F17210 Nicotine dependence, cigarettes, uncomplicated: Secondary | ICD-10-CM | POA: Diagnosis present

## 2023-10-08 DIAGNOSIS — Z8269 Family history of other diseases of the musculoskeletal system and connective tissue: Secondary | ICD-10-CM

## 2023-10-08 DIAGNOSIS — A498 Other bacterial infections of unspecified site: Secondary | ICD-10-CM | POA: Diagnosis present

## 2023-10-08 DIAGNOSIS — Z83438 Family history of other disorder of lipoprotein metabolism and other lipidemia: Secondary | ICD-10-CM

## 2023-10-08 DIAGNOSIS — Z8049 Family history of malignant neoplasm of other genital organs: Secondary | ICD-10-CM

## 2023-10-08 DIAGNOSIS — Z801 Family history of malignant neoplasm of trachea, bronchus and lung: Secondary | ICD-10-CM

## 2023-10-08 DIAGNOSIS — M4658 Other infective spondylopathies, sacral and sacrococcygeal region: Secondary | ICD-10-CM | POA: Diagnosis present

## 2023-10-08 DIAGNOSIS — Z8249 Family history of ischemic heart disease and other diseases of the circulatory system: Secondary | ICD-10-CM

## 2023-10-08 DIAGNOSIS — I10 Essential (primary) hypertension: Secondary | ICD-10-CM | POA: Diagnosis present

## 2023-10-08 DIAGNOSIS — K5904 Chronic idiopathic constipation: Secondary | ICD-10-CM | POA: Diagnosis present

## 2023-10-08 DIAGNOSIS — N179 Acute kidney failure, unspecified: Principal | ICD-10-CM | POA: Diagnosis present

## 2023-10-08 DIAGNOSIS — R1013 Epigastric pain: Secondary | ICD-10-CM

## 2023-10-08 DIAGNOSIS — F1990 Other psychoactive substance use, unspecified, uncomplicated: Secondary | ICD-10-CM

## 2023-10-08 DIAGNOSIS — Z87412 Personal history of vulvar dysplasia: Secondary | ICD-10-CM

## 2023-10-08 DIAGNOSIS — Z841 Family history of disorders of kidney and ureter: Secondary | ICD-10-CM

## 2023-10-08 DIAGNOSIS — F1120 Opioid dependence, uncomplicated: Secondary | ICD-10-CM | POA: Diagnosis present

## 2023-10-08 HISTORY — DX: Unspecified viral hepatitis C without hepatic coma: B19.20

## 2023-10-08 LAB — COMPREHENSIVE METABOLIC PANEL WITH GFR
ALT: 24 U/L (ref 0–44)
AST: 28 U/L (ref 15–41)
Albumin: 3.4 g/dL — ABNORMAL LOW (ref 3.5–5.0)
Alkaline Phosphatase: 230 U/L — ABNORMAL HIGH (ref 38–126)
Anion gap: 16 — ABNORMAL HIGH (ref 5–15)
BUN: 46 mg/dL — ABNORMAL HIGH (ref 6–20)
CO2: 32 mmol/L (ref 22–32)
Calcium: 8.9 mg/dL (ref 8.9–10.3)
Chloride: 87 mmol/L — ABNORMAL LOW (ref 98–111)
Creatinine, Ser: 2.84 mg/dL — ABNORMAL HIGH (ref 0.44–1.00)
GFR, Estimated: 19 mL/min — ABNORMAL LOW (ref >60)
Glucose, Bld: 89 mg/dL (ref 70–99)
Potassium: 5.2 mmol/L — ABNORMAL HIGH (ref 3.5–5.1)
Sodium: 135 mmol/L (ref 135–145)
Total Bilirubin: 0.5 mg/dL (ref 0.0–1.2)
Total Protein: 8.4 g/dL — ABNORMAL HIGH (ref 6.5–8.1)

## 2023-10-08 LAB — CBC
HCT: 38.5 % (ref 36.0–46.0)
Hemoglobin: 12.9 g/dL (ref 12.0–15.0)
MCH: 29.1 pg (ref 26.0–34.0)
MCHC: 33.5 g/dL (ref 30.0–36.0)
MCV: 86.9 fL (ref 80.0–100.0)
Platelets: 274 K/uL (ref 150–400)
RBC: 4.43 MIL/uL (ref 3.87–5.11)
RDW: 14.1 % (ref 11.5–15.5)
WBC: 8.6 K/uL (ref 4.0–10.5)
nRBC: 0 % (ref 0.0–0.2)

## 2023-10-08 LAB — URINALYSIS, ROUTINE W REFLEX MICROSCOPIC
Bacteria, UA: NONE SEEN
Bilirubin Urine: NEGATIVE
Glucose, UA: NEGATIVE mg/dL
Hgb urine dipstick: NEGATIVE
Ketones, ur: NEGATIVE mg/dL
Leukocytes,Ua: NEGATIVE
Nitrite: NEGATIVE
Protein, ur: 30 mg/dL — AB
Specific Gravity, Urine: 1.012 (ref 1.005–1.030)
pH: 7 (ref 5.0–8.0)

## 2023-10-08 LAB — MAGNESIUM: Magnesium: 2.3 mg/dL (ref 1.7–2.4)

## 2023-10-08 LAB — CBC WITH DIFFERENTIAL/PLATELET
Abs Immature Granulocytes: 0.06 K/uL (ref 0.00–0.07)
Basophils Absolute: 0.1 K/uL (ref 0.0–0.1)
Basophils Relative: 1 %
Eosinophils Absolute: 0.2 K/uL (ref 0.0–0.5)
Eosinophils Relative: 2 %
HCT: 39.1 % (ref 36.0–46.0)
Hemoglobin: 13.2 g/dL (ref 12.0–15.0)
Immature Granulocytes: 1 %
Lymphocytes Relative: 24 %
Lymphs Abs: 1.8 K/uL (ref 0.7–4.0)
MCH: 29.4 pg (ref 26.0–34.0)
MCHC: 33.8 g/dL (ref 30.0–36.0)
MCV: 87.1 fL (ref 80.0–100.0)
Monocytes Absolute: 0.7 K/uL (ref 0.1–1.0)
Monocytes Relative: 10 %
Neutro Abs: 4.6 K/uL (ref 1.7–7.7)
Neutrophils Relative %: 62 %
Platelets: 296 K/uL (ref 150–400)
RBC: 4.49 MIL/uL (ref 3.87–5.11)
RDW: 14.3 % (ref 11.5–15.5)
WBC: 7.4 K/uL (ref 4.0–10.5)
nRBC: 0 % (ref 0.0–0.2)

## 2023-10-08 LAB — POCT URINALYSIS DIPSTICK
Bilirubin, UA: NEGATIVE
Glucose, UA: NEGATIVE
Ketones, UA: NEGATIVE
Leukocytes, UA: NEGATIVE
Nitrite, UA: NEGATIVE
Protein, UA: POSITIVE — AB
Spec Grav, UA: 1.015 (ref 1.010–1.025)
Urobilinogen, UA: 1 U/dL
pH, UA: 8 (ref 5.0–8.0)

## 2023-10-08 LAB — BASIC METABOLIC PANEL WITH GFR
Anion gap: 11 (ref 5–15)
BUN: 48 mg/dL — ABNORMAL HIGH (ref 6–20)
CO2: 33 mmol/L — ABNORMAL HIGH (ref 22–32)
Calcium: 8.6 mg/dL — ABNORMAL LOW (ref 8.9–10.3)
Chloride: 88 mmol/L — ABNORMAL LOW (ref 98–111)
Creatinine, Ser: 2.93 mg/dL — ABNORMAL HIGH (ref 0.44–1.00)
GFR, Estimated: 19 mL/min — ABNORMAL LOW (ref >60)
Glucose, Bld: 101 mg/dL — ABNORMAL HIGH (ref 70–99)
Potassium: 5.8 mmol/L — ABNORMAL HIGH (ref 3.5–5.1)
Sodium: 132 mmol/L — ABNORMAL LOW (ref 135–145)

## 2023-10-08 MED ORDER — ONDANSETRON HCL 4 MG/2ML IJ SOLN
4.0000 mg | Freq: Once | INTRAMUSCULAR | Status: AC
  Administered 2023-10-08: 4 mg via INTRAVENOUS

## 2023-10-08 MED ORDER — FAMOTIDINE IN NACL 20-0.9 MG/50ML-% IV SOLN
20.0000 mg | Freq: Once | INTRAVENOUS | Status: AC
  Administered 2023-10-08: 20 mg via INTRAVENOUS
  Filled 2023-10-08: qty 50

## 2023-10-08 MED ORDER — DEXTROSE-SODIUM CHLORIDE 5-0.9 % IV SOLN
INTRAVENOUS | Status: AC
  Administered 2023-10-09: 100 mL/h via INTRAVENOUS

## 2023-10-08 MED ORDER — PANTOPRAZOLE SODIUM 40 MG PO TBEC: 40.0000 mg | DELAYED_RELEASE_TABLET | Freq: Two times a day (BID) | ORAL | 0 refills | Status: DC

## 2023-10-08 MED ORDER — DIAZEPAM 2 MG PO TABS
2.0000 mg | ORAL_TABLET | Freq: Three times a day (TID) | ORAL | Status: DC
  Administered 2023-10-08 – 2023-10-10 (×6): 2 mg via ORAL
  Filled 2023-10-08 (×6): qty 1

## 2023-10-08 MED ORDER — SODIUM CHLORIDE 0.9 % IV BOLUS
1000.0000 mL | Freq: Once | INTRAVENOUS | Status: AC
  Administered 2023-10-08: 1000 mL via INTRAVENOUS

## 2023-10-08 MED ORDER — LABETALOL HCL 5 MG/ML IV SOLN
10.0000 mg | INTRAVENOUS | Status: DC | PRN
  Administered 2023-10-08: 10 mg via INTRAVENOUS
  Filled 2023-10-08: qty 4

## 2023-10-08 MED ORDER — OXYCODONE-ACETAMINOPHEN 5-325 MG PO TABS
1.0000 | ORAL_TABLET | Freq: Three times a day (TID) | ORAL | Status: DC | PRN
  Administered 2023-10-08 – 2023-10-09 (×2): 1 via ORAL
  Filled 2023-10-08 (×2): qty 1

## 2023-10-08 MED ORDER — SUCRALFATE 1 GM/10ML PO SUSP
1.0000 g | Freq: Once | ORAL | Status: AC
  Administered 2023-10-08: 1 g via ORAL
  Filled 2023-10-08: qty 10

## 2023-10-08 MED ORDER — SODIUM CHLORIDE 0.9 % IV SOLN: 2.0000 g | INTRAVENOUS | Status: DC

## 2023-10-08 MED ORDER — PANTOPRAZOLE SODIUM 40 MG IV SOLR
40.0000 mg | Freq: Two times a day (BID) | INTRAVENOUS | Status: DC
  Administered 2023-10-08 – 2023-10-10 (×4): 40 mg via INTRAVENOUS
  Filled 2023-10-08 (×4): qty 10

## 2023-10-08 MED ORDER — ORAL CARE MOUTH RINSE: 15.0000 mL | OROMUCOSAL | Status: DC | PRN

## 2023-10-08 MED ORDER — SUCRALFATE 1 GM/10ML PO SUSP
1.0000 g | Freq: Three times a day (TID) | ORAL | Status: DC
Start: 2023-10-08 — End: 2023-10-10
  Administered 2023-10-08 – 2023-10-10 (×6): 1 g via ORAL
  Filled 2023-10-08 (×6): qty 10

## 2023-10-08 MED ORDER — AMITRIPTYLINE HCL 25 MG PO TABS
25.0000 mg | ORAL_TABLET | Freq: Every day | ORAL | Status: DC
  Administered 2023-10-08 – 2023-10-09 (×2): 25 mg via ORAL
  Filled 2023-10-08 (×2): qty 1

## 2023-10-08 MED ORDER — POLYETHYLENE GLYCOL 3350 17 G PO PACK
17.0000 g | PACK | Freq: Every day | ORAL | Status: DC | PRN
  Administered 2023-10-08: 17 g via ORAL
  Filled 2023-10-08: qty 1

## 2023-10-08 MED ORDER — DEXTROSE-SODIUM CHLORIDE 5-0.9 % IV SOLN: INTRAVENOUS | Status: DC

## 2023-10-08 MED ORDER — ONDANSETRON HCL 4 MG PO TABS: 4.0000 mg | ORAL_TABLET | Freq: Four times a day (QID) | ORAL | Status: DC | PRN

## 2023-10-08 MED ORDER — ACETAMINOPHEN 325 MG PO TABS
650.0000 mg | ORAL_TABLET | Freq: Four times a day (QID) | ORAL | Status: DC | PRN
  Administered 2023-10-08 – 2023-10-09 (×2): 650 mg via ORAL
  Filled 2023-10-08 (×2): qty 2

## 2023-10-08 MED ORDER — ACETAMINOPHEN 650 MG RE SUPP: 650.0000 mg | Freq: Four times a day (QID) | RECTAL | Status: DC | PRN

## 2023-10-08 MED ORDER — ONDANSETRON HCL 4 MG/2ML IJ SOLN
INTRAMUSCULAR | Status: AC
  Administered 2023-10-08: 4 mg via INTRAVENOUS
  Filled 2023-10-08: qty 2

## 2023-10-08 MED ORDER — ONDANSETRON HCL 4 MG/2ML IJ SOLN: 4.0000 mg | Freq: Four times a day (QID) | INTRAMUSCULAR | Status: DC | PRN

## 2023-10-08 MED ORDER — CEFTRIAXONE SODIUM 2 G IJ SOLR
2.0000 g | INTRAMUSCULAR | Status: DC
  Administered 2023-10-08: 2 g via INTRAVENOUS
  Filled 2023-10-08: qty 20

## 2023-10-08 NOTE — Assessment & Plan Note (Addendum)
 With cultures growing Serratia-pansensitive.  CT renal stone study today no CT findings to correspond with suspected septic arthritis of right SI joint, compared to MRI 09/21/2023. - Was discharged 6/26 to complete 28-day course of Bactrim -held for now with AKI -Continue course for now while inpatient with IV ceftriaxone  2 g daily

## 2023-10-08 NOTE — Assessment & Plan Note (Addendum)
 Epigastric pain, with vomiting and poor oral intake.  No melena.  No blood loss. Hgb stable.  CT renal stone study- not suggestive of abdominal pathology or etiology - IV Protonix  40 twice daily -Consult GI - N.p.o. midnight -Carafate 

## 2023-10-08 NOTE — Assessment & Plan Note (Addendum)
 On methadone , held for now - As needed oxycodone  acetaminophen 

## 2023-10-08 NOTE — ED Provider Notes (Signed)
 [place] EMERGENCY DEPARTMENT AT [facility] Provider Note   CSN: [id number] Arrival date & time: 10/08/23  1313     Patient presents with: hyperkalemia   Jamie KLEMMER is a 53 y.o. female.   HPI Patient presents with concern for fatigue, epigastric pain.  Patient was hospitalized here last month, discharged generally well.  She notes that several days later she began having abdominal pain, in the upper portion, worse in the epigastrium. Nausea anorexia but no vomiting, no diarrhea.  Patient is on methadone , has been using oxycodone  occasionally.  She considered constipation, has been taking MiraLAX  and has had bowel movements in the past 24 hours. Saw her physician this morning had labs that when they resulted were abnormal and she was sent here for evaluation.     Prior to Admission medications   Medication Sig Start Date End Date Taking? Authorizing Provider  amitriptyline  (ELAVIL ) 25 MG tablet Take 1 tablet (25 mg total) by mouth at bedtime. 09/25/23  Yes Tat, Alm, MD  diazepam  (VALIUM ) 5 MG tablet Take 1 tablet (5 mg total) by mouth every 8 (eight) hours. 09/25/23  Yes Tat, Alm, MD  ibuprofen  (ADVIL ) 200 MG tablet Take 600 mg by mouth every 6 (six) hours as needed for mild pain (pain score 1-3).   Yes [provider]  losartan  (COZAAR ) 50 MG tablet Take 1 tablet (50 mg total) by mouth daily. 09/26/23  Yes Tat, Alm, MD  methadone  (DOLOPHINE ) 10 MG/ML solution Take 105 mLs by mouth daily. [facility] [phone]   Yes [provider]  ondansetron  (ZOFRAN -ODT) 8 MG disintegrating tablet Take 1 tablet (8 mg total) by mouth every 8 (eight) hours as needed for nausea or vomiting. 09/25/23  Yes Tat, Alm, MD  oxyCODONE  (OXY IR/ROXICODONE ) 5 MG immediate release tablet Take 1 tablet (5 mg total) by mouth every 4 (four) hours as needed for moderate pain (pain score 4-6). 09/25/23  Yes TatAlm, MD  pantoprazole  (PROTONIX ) 40 MG  tablet Take 1 tablet (40 mg total) by mouth 2 (two) times daily before a meal. 10/08/23  Yes Comer Kirsch, PA-C  sulfamethoxazole -trimethoprim  (BACTRIM  DS) 800-160 MG tablet Take 1 tablet by mouth every 12 (twelve) hours. 09/25/23  Yes TatAlm, MD  vitamin B-12 (VITAMIN B12) 500 MCG tablet Take 1 tablet (500 mcg total) by mouth daily. 09/25/23  Yes Tat, Alm, MD  gabapentin  (NEURONTIN ) 300 MG capsule Take 1 capsule (300 mg total) by mouth 3 (three) times daily. 09/25/23   Evonnie Alm, MD    Allergies: Hydrocodone and Penicillins    Review of Systems  Updated Vital Signs BP (!) 150/106 (BP Location: Right Arm)   Pulse 98   Temp 98.2 F (36.8 C) (Oral)   Resp 14   Ht 1.594 m (5' 2.75)   Wt 61.5 kg   LMP 10/11/2014   SpO2 97%   BMI 24.19 kg/m   Physical Exam Vitals and nursing note reviewed.  Constitutional:      General: She is not in acute distress.    Appearance: She is well-developed. She is ill-appearing. She is not toxic-appearing or diaphoretic.  HENT:     Head: Normocephalic and atraumatic.  Eyes:     Conjunctiva/sclera: Conjunctivae normal.  Cardiovascular:     Rate and Rhythm: Regular rhythm. Tachycardia present.  Pulmonary:     Effort: Pulmonary effort is normal. No respiratory distress.     Breath sounds: Normal breath sounds. No stridor.  Abdominal:  General: There is no distension.     Tenderness: There is abdominal tenderness.  Skin:    General: Skin is warm and dry.  Neurological:     Mental Status: She is alert and oriented to person, place, and time.     Cranial Nerves: No cranial nerve deficit.  Psychiatric:        Mood and Affect: Mood normal.     (all labs ordered are listed, but only abnormal results are displayed) Labs Reviewed  COMPREHENSIVE METABOLIC PANEL WITH GFR - Abnormal; Notable for the following components:      Result Value   Potassium 5.2 (*)    Chloride 87 (*)    BUN 46 (*)    Creatinine, Ser 2.84 (*)    Total Protein 8.4  (*)    Albumin 3.4 (*)    Alkaline Phosphatase 230 (*)    GFR, Estimated 19 (*)    Anion gap 16 (*)    All other components within normal limits  URINALYSIS, ROUTINE W REFLEX MICROSCOPIC - Abnormal; Notable for the following components:   Protein, ur 30 (*)    All other components within normal limits  CBC WITH DIFFERENTIAL/PLATELET  MAGNESIUM    EKG: EKG Interpretation Date/Time:  Monday October 08 2023 13:47:44 EDT Ventricular Rate:  99 PR Interval:  112 QRS Duration:  74 QT Interval:  385 QTC Calculation: 495 R Axis:   77  Text Interpretation: Sinus rhythm Consider left ventricular hypertrophy Borderline prolonged QT interval Confirmed by Garrick Charleston [phone]) on 10/08/2023 3:18:23 PM  Radiology: CT Renal Stone Study Result Date: 10/08/2023 CLINICAL DATA:  Abdominal and flank pain, acute renal insufficiency, hyperkalemia EXAM: CT ABDOMEN AND PELVIS WITHOUT CONTRAST TECHNIQUE: Multidetector CT imaging of the abdomen and pelvis was performed following the standard protocol without IV contrast. RADIATION DOSE REDUCTION: This exam was performed according to the departmental dose-optimization program which includes automated exposure control, adjustment of the mA and/or kV according to patient size and/or use of iterative reconstruction technique. COMPARISON:  06/18/2008, 09/21/2023 FINDINGS: Lower chest: There is patchy consolidation within the medial segment of the right middle lobe, which could reflect atelectasis or bronchopneumonia. No effusion. Hepatobiliary: Prior cholecystectomy with expected postsurgical dilatation of the common bile duct. There is mild periportal edema, nonspecific. Otherwise unremarkable unenhanced appearance of the liver. Pancreas: Unremarkable unenhanced appearance. Spleen: Unremarkable unenhanced appearance. Adrenals/Urinary Tract: No urinary tract calculi or obstructive uropathy within either kidney. The adrenals and bladder are grossly unremarkable.  Stomach/Bowel: No bowel obstruction or ileus. Moderate stool within the proximal colon compatible with constipation. Normal appendix right lower quadrant. No bowel wall thickening or inflammatory change. Vascular/Lymphatic: Aortic atherosclerosis. No enlarged abdominal or pelvic lymph nodes. Reproductive: Stable 3.2 cm simple cysts within the right ovary. The uterus and left adnexa are unremarkable. Other: No free fluid or free intraperitoneal gas. No abdominal wall hernia. Musculoskeletal: There are no acute displaced fractures. There are no destructive bony lesions. There are no CT abnormalities involving the right sacroiliac joint to correspond with previous MRI findings. Specifically, no evidence of abscess or osteomyelitis. Reconstructed images demonstrate no additional findings. IMPRESSION: 1. Moderate stool throughout the colon consistent with constipation. No bowel obstruction or ileus. 2. Mild periportal edema throughout the liver, minute specific. Please correlate with liver function tests. Assessment of the liver parenchyma is limited without IV contrast. 3. No CT findings to correspond with the suspected septic arthritis of the right SI joint on the 09/21/2023 MRI. Specifically, no bony destruction or fluid  collection. 4.  Aortic Atherosclerosis (ICD10-I70.0). Electronically Signed   By: Ozell Daring M.D.   On: 10/08/2023 16:20     Procedures   Medications Ordered in the ED  sodium chloride  0.9 % bolus 1,000 mL (0 mLs Intravenous Stopped 10/08/23 1551)  sucralfate  (CARAFATE ) 1 GM/10ML suspension 1 g (1 g Oral Given 10/08/23 1425)  famotidine  (PEPCID ) IVPB 20 mg premix (0 mg Intravenous Stopped 10/08/23 1519)  ondansetron  (ZOFRAN ) injection 4 mg (4 mg Intravenous Given 10/08/23 1645)  sodium chloride  0.9 % bolus 1,000 mL (1,000 mLs Intravenous New Bag/Given 10/08/23 1612)                                    Medical Decision Making Adult female with reported outpatient hyperkalemia in the context  of abdominal pain, broad differential including mass, dehydration, infection, renal dysfunction. Cardiac 95 sinus normal Pulse ox 100% room air normal, patient was more tachycardic on arrival.   Amount and/or Complexity of Data Reviewed Independent Historian:     Details: Child at bedside External Data Reviewed: notes.    Details: Notes from earlier today Labs: ordered. Decision-making details documented in ED Course. Radiology: ordered and independent interpretation performed. Decision-making details documented in ED Course.  Risk Prescription drug management. Decision regarding hospitalization. Diagnosis or treatment significantly limited by social determinants of health.   5:00 PM Patient in no distress, awake, alert, feeling better after receiving Zofran .  Have reviewed the labs, CT imaging, she has received 2 L fluid resuscitation. CT without acute findings, labs consistent with acute kidney injury, mild hyperkalemia. Patient admitted for further monitoring, management.     Final diagnoses:  AKI (acute kidney injury) [facility])    ED Discharge Orders     None          Garrick Charleston, MD 10/08/23 1700

## 2023-10-08 NOTE — Assessment & Plan Note (Signed)
 Elevated.  Systolic 150s up to 203. -Hold losartan  with AKI -As needed labetalol  for systolic greater than 170
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
## 2023-10-08 NOTE — Plan of Care (Signed)
   Problem: Education: Goal: Knowledge of General Education information will improve Description: Including pain rating scale, medication(s)/side effects and non-pharmacologic comfort measures Outcome: Progressing   Problem: Activity: Goal: Risk for activity intolerance will decrease Outcome: Progressing   Problem: Coping: Goal: Level of anxiety will decrease Outcome: Progressing

## 2023-10-08 NOTE — Progress Notes (Signed)
 BP (!) 150/105   Pulse 99   Temp 98.6 F (37 C)   LMP 10/11/2014   SpO2 99%    Subjective:    Patient ID: Jamie Evans Maiden, female    DOB: 11/04/1970, 53 y.o.   MRN: [id number]  HPI: ERNESHA RAMONE is a 53 y.o. female presenting on 10/08/2023 for No chief complaint on file.   HPI    Pt states constipation.  She took miralax  for 3 days and another pill stool softener.  She then had diarrhea yesterday.   Now she is c/o abd pain.  She says she is still having heartburn despite taking protonix . No emesis.      Relevant past medical, surgical, family and social history reviewed and updated as indicated. Interim medical history since our last visit reviewed. Allergies and medications reviewed and updated.   Current Outpatient Medications:    amitriptyline  (ELAVIL ) 25 MG tablet, Take 1 tablet (25 mg total) by mouth at bedtime., Disp: 30 tablet, Rfl: 0   diazepam  (VALIUM ) 5 MG tablet, Take 1 tablet (5 mg total) by mouth every 8 (eight) hours., Disp: 12 tablet, Rfl: 0   folic acid  (FOLVITE ) 1 MG tablet, Take 1 tablet (1 mg total) by mouth daily., Disp: , Rfl:    gabapentin  (NEURONTIN ) 300 MG capsule, Take 1 capsule (300 mg total) by mouth 3 (three) times daily., Disp: 90 capsule, Rfl: 0   ibuprofen  (ADVIL ) 200 MG tablet, Take 600 mg by mouth every 6 (six) hours as needed for mild pain (pain score 1-3)., Disp: , Rfl:    losartan  (COZAAR ) 50 MG tablet, Take 1 tablet (50 mg total) by mouth daily., Disp: 30 tablet, Rfl: 0   ondansetron  (ZOFRAN -ODT) 8 MG disintegrating tablet, Take 1 tablet (8 mg total) by mouth every 8 (eight) hours as needed for nausea or vomiting., Disp: 20 tablet, Rfl: 0   oxyCODONE  (OXY IR/ROXICODONE ) 5 MG immediate release tablet, Take 1 tablet (5 mg total) by mouth every 4 (four) hours as needed for moderate pain (pain score 4-6)., Disp: 20 tablet, Rfl: 0   sulfamethoxazole -trimethoprim  (BACTRIM  DS) 800-160 MG tablet, Take 1 tablet by mouth every 12 (twelve) hours., Disp:  58 tablet, Rfl: 0   vitamin B-12 (VITAMIN B12) 500 MCG tablet, Take 1 tablet (500 mcg total) by mouth daily., Disp: , Rfl:    pantoprazole  (PROTONIX ) 40 MG tablet, Take 1 tablet (40 mg total) by mouth 2 (two) times daily before a meal., Disp: 60 tablet, Rfl: 0    Review of Systems  Per HPI unless specifically indicated above     Objective:    BP (!) 150/105   Pulse 99   Temp 98.6 F (37 C)   LMP 10/11/2014   SpO2 99%   Wt Readings from Last 3 Encounters:  09/27/23 135 lb 8 oz (61.5 kg)  09/26/23 133 lb 3.2 oz (60.4 kg)  09/21/23 138 lb 7.2 oz (62.8 kg)    Physical Exam Vitals reviewed.  Constitutional:      General: She is not in acute distress. HENT:     Head: Normocephalic and atraumatic.  Cardiovascular:     Rate and Rhythm: Normal rate and regular rhythm.  Pulmonary:     Effort: Pulmonary effort is normal.     Breath sounds: Normal breath sounds.  Abdominal:     General: Bowel sounds are normal.     Palpations: Abdomen is soft. There is no hepatomegaly, splenomegaly, mass or pulsatile mass.  Tenderness: There is abdominal tenderness in the epigastric area. There is no right CVA tenderness, left CVA tenderness, guarding or rebound.  Musculoskeletal:     Cervical back: Neck supple.     Right lower leg: No edema.     Left lower leg: No edema.  Lymphadenopathy:     Cervical: No cervical adenopathy.  Skin:    General: Skin is warm and dry.  Neurological:     Mental Status: She is alert and oriented to person, place, and time.  Psychiatric:        Behavior: Behavior normal.            Assessment & Plan:   Encounter Diagnoses  Name Primary?   Epigastric pain Yes   Drug-induced constipation    Substance use disorder      -pt was counseled on narcotics and constipation.  She was encouraged to drink plenty of water .  Recommended otc stool softener when using narcotics.  Can use miralax  prn -increase pantoprazole  to bid.  Refer to GI.  She has high  risk gastritis and worsening epigastric pain. -pt to get bloodwork done -follow up later this month as scheduled.   She is to contact office sooner prn worsening or new symptoms

## 2023-10-08 NOTE — Assessment & Plan Note (Addendum)
 Creatinine elevated 2.84, from baseline of  ~0.8.  Likely due to poor oral intake plus medications Bactrim  and losartan . -2 L bolus given, continue D5 N/s 100cc/hr x 15hrs - Hold Bactrim , losartan 

## 2023-10-08 NOTE — Assessment & Plan Note (Signed)
-   Resume Elavil ,  - resume Valium  at reduced dose 2.5 mg q8h. Patient reports she needs the Valium  for anxiety, but daughter reports the 5 mg dose made patient sleepy a lot.

## 2023-10-08 NOTE — ED Notes (Signed)
 ED Provider at bedside.

## 2023-10-08 NOTE — ED Notes (Signed)
 Family at bedside.

## 2023-10-08 NOTE — ED Triage Notes (Signed)
 Pt arrived via POV for further evaluation due to receiving a phone call from her PCP office regarding her recent blood work she had tested today. Per note in Pts chart, Pt was advised to go to the ER for treatment of hyperkalemia and AKI.

## 2023-10-09 ENCOUNTER — Encounter (HOSPITAL_COMMUNITY): Payer: Self-pay | Admitting: Internal Medicine

## 2023-10-09 DIAGNOSIS — D649 Anemia, unspecified: Secondary | ICD-10-CM

## 2023-10-09 DIAGNOSIS — D529 Folate deficiency anemia, unspecified: Secondary | ICD-10-CM

## 2023-10-09 DIAGNOSIS — K5903 Drug induced constipation: Secondary | ICD-10-CM

## 2023-10-09 DIAGNOSIS — B182 Chronic viral hepatitis C: Secondary | ICD-10-CM

## 2023-10-09 DIAGNOSIS — R638 Other symptoms and signs concerning food and fluid intake: Secondary | ICD-10-CM

## 2023-10-09 DIAGNOSIS — D519 Vitamin B12 deficiency anemia, unspecified: Secondary | ICD-10-CM

## 2023-10-09 LAB — BASIC METABOLIC PANEL WITH GFR
Anion gap: 6 (ref 5–15)
Anion gap: 11 (ref 5–15)
BUN: 31 mg/dL — ABNORMAL HIGH (ref 6–20)
BUN: 27 mg/dL — ABNORMAL HIGH (ref 6–20)
CO2: 29 mmol/L (ref 22–32)
CO2: 26 mmol/L (ref 22–32)
Calcium: 8.3 mg/dL — ABNORMAL LOW (ref 8.9–10.3)
Calcium: 8.7 mg/dL — ABNORMAL LOW (ref 8.9–10.3)
Chloride: 101 mmol/L (ref 98–111)
Chloride: 98 mmol/L (ref 98–111)
Creatinine, Ser: 1.95 mg/dL — ABNORMAL HIGH (ref 0.44–1.00)
Creatinine, Ser: 1.97 mg/dL — ABNORMAL HIGH (ref 0.44–1.00)
GFR, Estimated: 30 mL/min — ABNORMAL LOW (ref >60)
GFR, Estimated: 30 mL/min — ABNORMAL LOW (ref >60)
Glucose, Bld: 93 mg/dL (ref 70–99)
Glucose, Bld: 82 mg/dL (ref 70–99)
Potassium: 6 mmol/L — ABNORMAL HIGH (ref 3.5–5.1)
Potassium: 5.7 mmol/L — ABNORMAL HIGH (ref 3.5–5.1)
Sodium: 136 mmol/L (ref 135–145)
Sodium: 135 mmol/L (ref 135–145)

## 2023-10-09 LAB — FERRITIN: Ferritin: 104 ng/mL (ref 11–307)

## 2023-10-09 LAB — IRON AND TIBC
Iron: 59 ug/dL (ref 28–170)
Saturation Ratios: 18 % (ref 10.4–31.8)
TIBC: 327 ug/dL (ref 250–450)
UIBC: 268 ug/dL

## 2023-10-09 LAB — HEPATIC FUNCTION PANEL
ALT: 24 U/L (ref 0–44)
AST: 27 U/L (ref 15–41)
Albumin: 2.7 g/dL — ABNORMAL LOW (ref 3.5–5.0)
Alkaline Phosphatase: 184 U/L — ABNORMAL HIGH (ref 38–126)
Bilirubin, Direct: <0.1 mg/dL (ref 0.0–0.2)
Total Bilirubin: 0.4 mg/dL (ref 0.0–1.2)
Total Protein: 6.6 g/dL (ref 6.5–8.1)

## 2023-10-09 LAB — RAPID URINE DRUG SCREEN, HOSP PERFORMED
Amphetamines: NOT DETECTED
Barbiturates: NOT DETECTED
Benzodiazepines: POSITIVE — AB
Cocaine: POSITIVE — AB
Opiates: NOT DETECTED
Tetrahydrocannabinol: NOT DETECTED

## 2023-10-09 LAB — CBC
HCT: 33.9 % — ABNORMAL LOW (ref 36.0–46.0)
Hemoglobin: 10.8 g/dL — ABNORMAL LOW (ref 12.0–15.0)
MCH: 28.8 pg (ref 26.0–34.0)
MCHC: 31.9 g/dL (ref 30.0–36.0)
MCV: 90.4 fL (ref 80.0–100.0)
Platelets: 211 K/uL (ref 150–400)
RBC: 3.75 MIL/uL — ABNORMAL LOW (ref 3.87–5.11)
RDW: 14 % (ref 11.5–15.5)
WBC: 7.1 K/uL (ref 4.0–10.5)
nRBC: 0 % (ref 0.0–0.2)

## 2023-10-09 LAB — POTASSIUM: Potassium: 4.9 mmol/L (ref 3.5–5.1)

## 2023-10-09 MED ORDER — DEXTROSE-SODIUM CHLORIDE 5-0.9 % IV SOLN: INTRAVENOUS | Status: AC

## 2023-10-09 MED ORDER — AMLODIPINE BESYLATE 5 MG PO TABS
10.0000 mg | ORAL_TABLET | Freq: Every day | ORAL | Status: DC
  Administered 2023-10-09 – 2023-10-10 (×2): 10 mg via ORAL
  Filled 2023-10-09 (×2): qty 2

## 2023-10-09 MED ORDER — SODIUM ZIRCONIUM CYCLOSILICATE 10 G PO PACK
10.0000 g | PACK | Freq: Once | ORAL | Status: AC
  Administered 2023-10-09: 10 g via ORAL
  Filled 2023-10-09: qty 1

## 2023-10-09 MED ORDER — SODIUM ZIRCONIUM CYCLOSILICATE 5 G PO PACK
5.0000 g | PACK | Freq: Once | ORAL | Status: AC
  Administered 2023-10-09: 5 g via ORAL
  Filled 2023-10-09: qty 1

## 2023-10-09 MED ORDER — SODIUM CHLORIDE 0.9 % IV SOLN
2.0000 g | INTRAVENOUS | Status: DC
  Administered 2023-10-09: 2 g via INTRAVENOUS
  Filled 2023-10-09: qty 12.5

## 2023-10-09 MED ORDER — HYDRALAZINE HCL 20 MG/ML IJ SOLN
10.0000 mg | INTRAMUSCULAR | Status: DC | PRN
  Administered 2023-10-10: 10 mg via INTRAVENOUS
  Filled 2023-10-09: qty 1

## 2023-10-09 MED ORDER — POLYETHYLENE GLYCOL 3350 17 G PO PACK
17.0000 g | PACK | Freq: Every day | ORAL | Status: DC
  Administered 2023-10-09 – 2023-10-10 (×2): 17 g via ORAL
  Filled 2023-10-09 (×2): qty 1

## 2023-10-09 MED ORDER — FENTANYL CITRATE PF 50 MCG/ML IJ SOSY
25.0000 ug | PREFILLED_SYRINGE | Freq: Once | INTRAMUSCULAR | Status: DC | PRN
Start: 2023-10-09 — End: 2023-10-10

## 2023-10-09 MED ORDER — GABAPENTIN 300 MG PO CAPS
300.0000 mg | ORAL_CAPSULE | Freq: Three times a day (TID) | ORAL | Status: DC
  Administered 2023-10-09 – 2023-10-10 (×3): 300 mg via ORAL
  Filled 2023-10-09 (×3): qty 1

## 2023-10-09 MED ORDER — SODIUM CHLORIDE 0.9 % IV SOLN
INTRAVENOUS | Status: DC
Start: 2023-10-09 — End: 2023-10-09

## 2023-10-09 MED ORDER — METHADONE HCL 10 MG PO TABS
105.0000 mg | ORAL_TABLET | Freq: Every day | ORAL | Status: DC
  Administered 2023-10-09 – 2023-10-10 (×2): 105 mg via ORAL
  Filled 2023-10-09 (×2): qty 11

## 2023-10-09 MED ORDER — LINACLOTIDE 145 MCG PO CAPS
145.0000 ug | ORAL_CAPSULE | Freq: Every day | ORAL | Status: DC
  Administered 2023-10-10: 145 ug via ORAL
  Filled 2023-10-09: qty 1

## 2023-10-09 NOTE — Progress Notes (Signed)
 Pt and family concerned about NPO status due to a possible EGD tomorrow. Feels pt should be able to eat because the GI doc informed them she would not be having the EGD procedure. Confirmed w/charge nurse, no active orders for EGD, however, there are orders for pt to remain on clear liquids and NPO at midnight. Advised pt and her family MD orders will be implemented. Will continue to monitor [facility] for any new orders regarding EGD.

## 2023-10-09 NOTE — Progress Notes (Addendum)
   10/09/23 0911  TOC Brief Assessment  Insurance and Status Lapsed  Patient has primary care physician Yes  Home environment has been reviewed Home  Prior level of function: Independent  Prior/Current Home Services No current home services  Social Drivers of Health Review SDOH reviewed no interventions necessary  Readmission risk has been reviewed Yes  Transition of care needs no transition of care needs at this time   Match voucher given 2 weeks ago. TOC following.   Transition of Care Department [facility]) has reviewed patient and no TOC needs have been identified at this time. We will continue to monitor patient advancement through interdisciplinary progression rounds. If new patient transition needs arise, please place a TOC consult.

## 2023-10-09 NOTE — Hospital Course (Addendum)
 Jamie Evans is a 53 y.o. female with medical history significant for Narcotic dependence, HTN, SI joint septic arthritis. Patient was sent to the ED from PCPs office with reports of high potassium and AKI.  Patient reports upper abdominal pain started shortly after leaving the hospital 8/26, he describes sharp persistent abdominal pain that is worse with food and improves with Alka-Seltzer.  She reports intermittent episodes of vomiting, but without blood or coffee grounds.  Reports constipation that improved after MiraLAX , with bowel movements without blood.  She reports poor oral intake due to abdominal pain.  No urinary symptoms.   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  Blood cultures grew Serratia.  Neurosurgeon and Ortho, recommended nonoperative management.  ID recommended 28 days of Bactrim .    ED Course: Temperature 98.2.  Heart rate 96.  Respirate rate 14.  Blood pressure 150/106.  O2 sat 97% on room air.  Creatinine elevated 2.84, potassium 5.2. CT renal stone study-shows constipation patient, mild periportal edema throughout the liver.  No CT findings to correspond with the suspected septic arthritis of the right SI joint on the 09/21/2023 MRI. Specifically, no bony destruction or fluid collection. 2L bolus given.

## 2023-10-09 NOTE — Consult Note (Addendum)
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 myositis. If there is concern regarding this possibility, consider pelvic MRI with and without contrast. I do not think that the finding is definite based on the limited evaluation provided by this exam. a 3. Prominent bile duct and pancreatic duct. This may relate to previous cholecystectomy, but is not primarily or completely evaluated. Electronically Signed   By: Oneil Officer M.D.   On: 09/20/2023 16:37   MR Lumbar Spine W Wo Contrast Result Date: 09/20/2023 CLINICAL DATA:  Back pain.  Intravenous drug use. EXAM: MRI THORACIC AND LUMBAR SPINE WITHOUT AND WITH CONTRAST TECHNIQUE: Multiplanar and multiecho pulse sequences of the thoracic and lumbar spine were obtained without and with intravenous contrast. CONTRAST:  6mL GADAVIST  GADOBUTROL  1 MMOL/ML IV SOLN COMPARISON:  07/08/2013 FINDINGS: MRI THORACIC SPINE FINDINGS Alignment:  No malalignment. Vertebrae: Benign appearing hemangioma within  the T7 vertebral body. No other finding. No evidence of bone infection. Cord:  Normal Paraspinal and other soft tissues: Normal Disc levels: No significant disc level finding. No stenosis of the canal or foramina. Minimal non-compressive disc bulges at T8-9 and T10-11, not likely of any significance. No evidence of facet arthropathy. MRI LUMBAR SPINE FINDINGS Segmentation:  5 lumbar type vertebral bodies. Alignment:  Normal Vertebrae: No fracture or focal bone lesion of significance. Insignificant small a meningioma within the L1 vertebral body. Conus medullaris: Extends to the L1-2 level and appears normal. No worrisome finding. Paraspinal and other soft tissues: Prominent bile duct and pancreatic duct. This may relate to previous cholecystectomy, but is not primarily or completely evaluated. Question if there is some edema and enhancement at the right ileo psoas junction. This could indicate myositis. If there is concern regarding this possibility, consider pelvic MRI with and without contrast. Disc levels: No significant disc level finding. No sign of spinal infection. Minimal non-compressive disc bulge L3-4. No sign of septic facet arthropathy. IMPRESSION: 1. No evidence of spinal infection in the thoracic or lumbar region. 2. Question if  there is some edema and enhancement at the right ileo psoas muscular junction. This could indicate myositis. If there is concern regarding this possibility, consider pelvic MRI with and without contrast. I do not think that the finding is definite based on the limited evaluation provided by this exam. a 3. Prominent bile duct and pancreatic duct. This may relate to previous cholecystectomy, but is not primarily or completely evaluated. Electronically Signed   By: Oneil Officer M.D.   On: 09/20/2023 16:37  [4 week]  Assessment:   53 y/o female with history of substance abuse (last use, cocaine/fentanyl  September 20, 2023), narcotic dependence, chronic HCV genotype 3, B12 and  folate deficiency (noted 08/2023 not clear she is taking supplements), anxiety, hypertension, GERD, SI joint septic arthritis presenting from PCP office with hyperkalemia and AKI and ongoing epigastric pain, poor oral intake.  Epigastric pain/nausea/poor oral intake: Symptoms started around time of discharge from hospital 09/24/23. Ongoing postprandial epigastric pain. Some reflux symptoms. Initially with vomiting but for past 4-5 days, mostly nausea and diminished oral intake. Presents with AKI, hyperkalemia as well as other electrolyte derangements. Readily admits to prior drug use (reporting last use 09/20/23). Remote etoh abuse. Ongoing NSAIDs/ASA use. Prior cholecystectomy. At risk of gastritis/PUD. Noncontrast CT with nonspecific mild periportal edema of unclear significance. Alkaline phosphatase increased but may not be secondary to liver source.  Constipation: In setting of methadone  or opioids -start linzess  145mcg daily  Normocytica anemia with B12/folate deficiency: -denies overt GI bleeding -unclear if she is taking recommended B12 and folate supplements -consider pernicious anemia work up -check iron/tibc/ferritin (although ferritin may be up due to APR)  Chronic Hep C: -genotype 3 -consider outpatient treatment  Plan:   Iron/tibc/ferritin, IF Ab, anti-parietal Ab, lfts. PPI BID. Carafate  as ordered. EGD after correction of potassium. Discussed with attending. Also advised UDS, would like to see negative for cocaine. Discussed with patient who consents. Given these factors anticipate EGD tomorrow per Dr. Eartha. Clear liquids. NPO after midnight.  Outpatient management of chronic Hep C. Outpatient colonoscopy predominantly for screening.   LOS: 0 days   We would like to thank you for the opportunity to participate in the care of Jamie Evans.  Jamie Evans. Jamie Evans [facility] [phone] 9/9/20259:56 AM

## 2023-10-09 NOTE — Progress Notes (Signed)
 PROGRESS NOTE    Patient: Jamie Evans Maiden                            PCP: Comer Kirsch, PA-C                    DOB: Jun 17, 1970            DOA: 10/08/2023 FMW:[id number]             DOS: 10/09/2023, 8:15 AM   LOS: 0 days   Date of Service: The patient was seen and examined on 10/09/2023  Subjective:   The patient was seen and examined this morning. Hemodynamically stable. No issues overnight .  Brief Narrative:   Jamie Evans is a 53 y.o. female with medical history significant for Narcotic dependence, HTN, SI joint septic arthritis. Patient was sent to the ED from PCPs office with reports of high potassium and AKI.  Patient reports upper abdominal pain started shortly after leaving the hospital 8/26, he describes sharp persistent abdominal pain that is worse with food and improves with Alka-Seltzer.  She reports intermittent episodes of vomiting, but without blood or coffee grounds.  Reports constipation that improved after MiraLAX , with bowel movements without blood.  She reports poor oral intake due to abdominal pain.  No urinary symptoms.   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  Blood cultures grew Serratia.  Neurosurgeon and Ortho, recommended nonoperative management.  ID recommended 28 days of Bactrim .    ED Course: Temperature 98.2.  Heart rate 96.  Respirate rate 14.  Blood pressure 150/106.  O2 sat 97% on room air.  Creatinine elevated 2.84, potassium 5.2. CT renal stone study-shows constipation patient, mild periportal edema throughout the liver.  No CT findings to correspond with the suspected septic arthritis of the right SI joint on the 09/21/2023 MRI. Specifically, no bony destruction or fluid collection. 2L bolus given.    Assessment & Plan:   Principal Problem:   AKI (acute kidney injury) (HCC) Active Problems:   Essential hypertension   Chronic narcotic dependence (HCC)   Abdominal pain   Anxiety and depression    Septic arthritis of sacroiliac joint (HCC)     Assessment and Plan: * AKI (acute kidney injury) (HCC) - Likely due to dehydration, secondary to nausea vomiting, abdominal pain Accommodation of medication of Bactrim  and losartan  Lab Results  Component Value Date   CREATININE 1.95 (H) 10/09/2023   CREATININE 2.84 (H) 10/08/2023   CREATININE 2.93 (H) 10/08/2023   - Improving, continue IV fluids  -2 L bolus given in ED  - Hold Bactrim , losartan   Septic arthritis of sacroiliac joint (HCC) With cultures growing Serratia-pansensitive.  - CT renal stone study today no CT findings to correspond with suspected septic arthritis of right SI joint, compared to MRI 09/21/2023. - Was discharged 6/26 to complete 28-day course of Bactrim -held for now with AKI -Continue course for now while inpatient with IV cefepime  Can be switched to p.o. ciprofloxacin  on discharge   Anxiety and depression - Resume Elavil ,  - resume Valium  at reduced dose 2.5 mg q8h.  - Patient reports she needs the Valium  for anxiety, but daughter reports the 5 mg dose made patient sleepy   Abdominal pain -Mainly epigastric, associated with nausea vomiting poor p.o. intake -Denies of having any hematochezia or melena  - Hgb stable.   - CT renal stone study- not suggestive of abdominal  pathology or etiology - Continue IV Protonix  40 twice daily -Consult GI-recommending PPI twice daily, EGD after potassium correction  -Clear liquid diet  then n.p.o. midnight - Will continue with Carafate   Hyperkalemia  - Potassium up to 6.0, will continue continue IV fluids, Lokelma  - Rechecking potassium level later today  chronic narcotic dependence (HCC) On methadone , held for now - As needed oxycodone  acetaminophen   Essential hypertension Elevated.  Systolic 150s up to 203. -Hold losartan  with AKI - Initiating as needed IV  hydralazine      ------------------------------------------------------------------------------------------------------------------------------ Nutritional status:  The patient's BMI is: Body mass index is 22.45 kg/m. I agree with the assessment and plan as outlined ---------------------------------------------------------------------------------------------------------------------------  DVT prophylaxis:  SCDs Start: 10/08/23 1734   Code Status:   Code Status: Full Code  Family Communication: No family member present at bedside-  -Advance care planning has been discussed.   Admission status:   Status is: Observation The patient remains OBS appropriate and will d/c before 2 midnights.   Disposition: From  - home             Planning for discharge in 1-2 days   Procedures:   No admission procedures for hospital encounter.   Antimicrobials:  Anti-infectives (From admission, onward)    Start     Dose/Rate Route Frequency Ordered Stop   10/09/23 0800  cefTRIAXone  (ROCEPHIN ) 2 g in sodium chloride  0.9 % 100 mL IVPB  Status:  Discontinued        2 g 200 mL/hr over 30 Minutes Intravenous Every 24 hours 10/08/23 1744 10/08/23 1751   10/08/23 1900  cefTRIAXone  (ROCEPHIN ) 2 g in sodium chloride  0.9 % 100 mL IVPB        2 g 200 mL/hr over 30 Minutes Intravenous Every 24 hours 10/08/23 1751          Medication:   amitriptyline   25 mg Oral QHS   diazepam   2 mg Oral Q8H   pantoprazole  (PROTONIX ) IV  40 mg Intravenous Q12H   sucralfate   1 g Oral Q8H    acetaminophen  **OR** acetaminophen , fentaNYL  (SUBLIMAZE ) injection, labetalol , ondansetron  **OR** ondansetron  (ZOFRAN ) IV, mouth rinse, oxyCODONE -acetaminophen , polyethylene glycol   Objective:   Vitals:   10/08/23 1835 10/08/23 2201 10/09/23 0205 10/09/23 0637  BP: (!) 146/91 (!) 168/94 (!) 158/89 (!) 154/78  Pulse:  94 90 72  Resp:  16 18 20   Temp:  97.8 F (36.6 C) 98.5 F (36.9 C) 98.1 F (36.7 C)  TempSrc:   Oral Oral Oral  SpO2:  100% 95% 100%  Weight:      Height:        Intake/Output Summary (Last 24 hours) at 10/09/2023 0815 Last data filed at 10/08/2023 1816 Gross per 24 hour  Intake 2101.18 ml  Output --  Net 2101.18 ml   Filed Weights   10/08/23 1320 10/08/23 1739  Weight: 61.5 kg 59.3 kg     Physical examination:   General:  AAO x 3,  cooperative, no distress;   HEENT:  Normocephalic, PERRL, otherwise with in Normal limits   Neuro:  CNII-XII intact. , normal motor and sensation, reflexes intact   Lungs:   Clear to auscultation BL, Respirations unlabored,  No wheezes / crackles  Cardio:    S1/S2, RRR, No murmure, No Rubs or Gallops   Abdomen:  Soft, non-tender, bowel sounds active all four quadrants, no guarding or peritoneal signs.  Muscular  skeletal:  Limited exam -global generalized weaknesses - in bed, able to move  all 4 extremities,   2+ pulses,  symmetric, No pitting edema  Skin:  Dry, warm to touch, negative for any Rashes,  Wounds: Please see nursing documentation       ------------------------------------------------------------------------------------------------------------------------------------------    LABs:     Latest Ref Rng & Units 10/09/2023    4:15 AM 10/08/2023    1:31 PM 10/08/2023   10:39 AM  CBC  WBC 4.0 - 10.5 K/uL 7.1  7.4  8.6   Hemoglobin 12.0 - 15.0 g/dL 89.1  86.7  87.0   Hematocrit 36.0 - 46.0 % 33.9  39.1  38.5   Platelets 150 - 400 K/uL 211  296  274       Latest Ref Rng & Units 10/09/2023    4:15 AM 10/08/2023    1:31 PM 10/08/2023   10:39 AM  CMP  Glucose 70 - 99 mg/dL 93  89  898   BUN 6 - 20 mg/dL 31  46  48   Creatinine 0.44 - 1.00 mg/dL 8.04  7.15  7.06   Sodium 135 - 145 mmol/L 136  135  132   Potassium 3.5 - 5.1 mmol/L 6.0  5.2  5.8   Chloride 98 - 111 mmol/L 101  87  88   CO2 22 - 32 mmol/L 29  32  33   Calcium  8.9 - 10.3 mg/dL 8.3  8.9  8.6   Total Protein 6.5 - 8.1 g/dL  8.4    Total Bilirubin 0.0 - 1.2 mg/dL  0.5     Alkaline Phos 38 - 126 U/L  230    AST 15 - 41 U/L  28    ALT 0 - 44 U/L  24         Micro Results No results found for this or any previous visit (from the past 240 hours).  Radiology Reports CT Renal Stone Study Result Date: 10/08/2023 CLINICAL DATA:  Abdominal and flank pain, acute renal insufficiency, hyperkalemia EXAM: CT ABDOMEN AND PELVIS WITHOUT CONTRAST TECHNIQUE: Multidetector CT imaging of the abdomen and pelvis was performed following the standard protocol without IV contrast. RADIATION DOSE REDUCTION: This exam was performed according to the departmental dose-optimization program which includes automated exposure control, adjustment of the mA and/or kV according to patient size and/or use of iterative reconstruction technique. COMPARISON:  06/18/2008, 09/21/2023 FINDINGS: Lower chest: There is patchy consolidation within the medial segment of the right middle lobe, which could reflect atelectasis or bronchopneumonia. No effusion. Hepatobiliary: Prior cholecystectomy with expected postsurgical dilatation of the common bile duct. There is mild periportal edema, nonspecific. Otherwise unremarkable unenhanced appearance of the liver. Pancreas: Unremarkable unenhanced appearance. Spleen: Unremarkable unenhanced appearance. Adrenals/Urinary Tract: No urinary tract calculi or obstructive uropathy within either kidney. The adrenals and bladder are grossly unremarkable. Stomach/Bowel: No bowel obstruction or ileus. Moderate stool within the proximal colon compatible with constipation. Normal appendix right lower quadrant. No bowel wall thickening or inflammatory change. Vascular/Lymphatic: Aortic atherosclerosis. No enlarged abdominal or pelvic lymph nodes. Reproductive: Stable 3.2 cm simple cysts within the right ovary. The uterus and left adnexa are unremarkable. Other: No free fluid or free intraperitoneal gas. No abdominal wall hernia. Musculoskeletal: There are no acute displaced fractures.  There are no destructive bony lesions. There are no CT abnormalities involving the right sacroiliac joint to correspond with previous MRI findings. Specifically, no evidence of abscess or osteomyelitis. Reconstructed images demonstrate no additional findings. IMPRESSION: 1. Moderate stool throughout the colon consistent with constipation. No bowel obstruction or  ileus. 2. Mild periportal edema throughout the liver, minute specific. Please correlate with liver function tests. Assessment of the liver parenchyma is limited without IV contrast. 3. No CT findings to correspond with the suspected septic arthritis of the right SI joint on the 09/21/2023 MRI. Specifically, no bony destruction or fluid collection. 4.  Aortic Atherosclerosis (ICD10-I70.0). Electronically Signed   By: Ozell Daring M.D.   On: 10/08/2023 16:20    SIGNED: Adriana DELENA Grams, MD, FHM. FAAFP. Jolynn Pack - [facility] Time spent - 55 min.  In seeing, evaluating and examining the patient. Reviewing medical records, labs, drawn plan of care. [facility],  Pager (please use [email or web address] to page/ text) Please use Epic Secure Chat for non-urgent communication (7AM-7PM)  If 7PM-7AM, please contact night-coverage [email or web address], 10/09/2023, 8:15 AM

## 2023-10-10 ENCOUNTER — Telehealth (INDEPENDENT_AMBULATORY_CARE_PROVIDER_SITE_OTHER): Payer: Self-pay | Admitting: Gastroenterology

## 2023-10-10 DIAGNOSIS — R110 Nausea: Secondary | ICD-10-CM

## 2023-10-10 DIAGNOSIS — K5904 Chronic idiopathic constipation: Secondary | ICD-10-CM

## 2023-10-10 LAB — CBC
HCT: 35.4 % — ABNORMAL LOW (ref 36.0–46.0)
Hemoglobin: 11.7 g/dL — ABNORMAL LOW (ref 12.0–15.0)
MCH: 28.9 pg (ref 26.0–34.0)
MCHC: 33.1 g/dL (ref 30.0–36.0)
MCV: 87.4 fL (ref 80.0–100.0)
Platelets: 224 K/uL (ref 150–400)
RBC: 4.05 MIL/uL (ref 3.87–5.11)
RDW: 13.9 % (ref 11.5–15.5)
WBC: 7.7 K/uL (ref 4.0–10.5)
nRBC: 0 % (ref 0.0–0.2)

## 2023-10-10 LAB — BASIC METABOLIC PANEL WITH GFR
Anion gap: 13 (ref 5–15)
BUN: 15 mg/dL (ref 6–20)
CO2: 24 mmol/L (ref 22–32)
Calcium: 8.8 mg/dL — ABNORMAL LOW (ref 8.9–10.3)
Chloride: 99 mmol/L (ref 98–111)
Creatinine, Ser: 1.42 mg/dL — ABNORMAL HIGH (ref 0.44–1.00)
GFR, Estimated: 44 mL/min — ABNORMAL LOW (ref >60)
Glucose, Bld: 97 mg/dL (ref 70–99)
Potassium: 4.3 mmol/L (ref 3.5–5.1)
Sodium: 136 mmol/L (ref 135–145)

## 2023-10-10 LAB — INTRINSIC FACTOR ANTIBODIES: Intrinsic Factor: 1 [AU]/ml (ref 0.0–1.1)

## 2023-10-10 LAB — ANTI-PARIETAL ANTIBODY: Parietal Cell Antibody-IgG: 9.2 U (ref 0.0–20.0)

## 2023-10-10 MED ORDER — DEXTROSE-SODIUM CHLORIDE 5-0.9 % IV SOLN: INTRAVENOUS | Status: DC

## 2023-10-10 MED ORDER — LINACLOTIDE 145 MCG PO CAPS: 145.0000 ug | ORAL_CAPSULE | Freq: Every day | ORAL | 2 refills | Status: DC

## 2023-10-10 MED ORDER — CIPROFLOXACIN HCL 500 MG PO TABS: 500.0000 mg | ORAL_TABLET | Freq: Two times a day (BID) | ORAL | 0 refills | Status: AC

## 2023-10-10 MED ORDER — AMLODIPINE BESYLATE 10 MG PO TABS: 10.0000 mg | ORAL_TABLET | Freq: Every day | ORAL | 1 refills | Status: AC

## 2023-10-10 MED ORDER — POLYETHYLENE GLYCOL 3350 17 G PO PACK: 17.0000 g | PACK | Freq: Every day | ORAL | 2 refills | Status: AC

## 2023-10-10 MED ORDER — SUCRALFATE 1 G PO TABS: 1.0000 g | ORAL_TABLET | Freq: Four times a day (QID) | ORAL | 1 refills | Status: DC

## 2023-10-10 MED ORDER — SODIUM CHLORIDE 0.9 % IV SOLN
2.0000 g | Freq: Two times a day (BID) | INTRAVENOUS | Status: DC
  Administered 2023-10-10: 2 g via INTRAVENOUS
  Filled 2023-10-10: qty 12.5

## 2023-10-10 NOTE — Progress Notes (Signed)
 Subjective: States she is hungry. No abdominal pain today, denies nausea or vomiting. No GERD symptoms. No BMs today. She notes that she has had similar history of epigastric pain in the past, after she had GYN procedure. No BMs since 9/7. She is taking miralax  1 capful at home. Has tried stool softener and gas relief without improvement. Linzess  was started this a.m.   Objective: Vital signs in last 24 hours: Temp:  [98 F (36.7 C)-98.2 F (36.8 C)] 98 F (36.7 C) (09/10 9347) Pulse Rate:  [84-100] 94 (09/10 0652) Resp:  [16-20] 16 (09/10 0652) BP: (136-211)/(94-106) 136/97 (09/10 0652) SpO2:  [95 %-99 %] 96 % (09/10 0652) Last BM Date : 10/07/23 General:   Alert and oriented, pleasant Head:  Normocephalic and atraumatic. Eyes:  No icterus, sclera clear. Conjuctiva pink.  Mouth:  Without lesions, mucosa pink and moist.   Heart:  S1, S2 present, no murmurs noted.  Lungs: Clear to auscultation bilaterally, without wheezing, rales, or rhonchi.  Abdomen:  Bowel sounds present, soft, TTP of Epigastric area. non-distended. No HSM or hernias noted. No rebound or guarding. No masses appreciated  Msk:  Symmetrical without gross deformities. Normal postures Neurologic:  Alert and  oriented x4;  grossly normal neurologically. Skin:  Warm and dry, intact without significant lesions.  Psych:  Alert and cooperative. Normal mood and affect.  Intake/Output from previous day: 09/09 0701 - 09/10 0700 In: 75 [P.O.:75] Out: -  Intake/Output this shift: No intake/output data recorded.  Lab Results: Recent Labs    10/08/23 1331 10/09/23 0415 10/10/23 0834  WBC 7.4 7.1 7.7  HGB 13.2 10.8* 11.7*  HCT 39.1 33.9* 35.4*  PLT 296 211 224   BMET Recent Labs    10/09/23 0415 10/09/23 1047 10/09/23 1751 10/10/23 0834  NA 136 135  --  136  K 6.0* 5.7* 4.9 4.3  CL 101 98  --  99  CO2 29 26  --  24  GLUCOSE 93 82  --  97  BUN 31* 27*  --  15  CREATININE 1.95* 1.97*  --  1.42*  CALCIUM  8.3*  8.7*  --  8.8*   LFT Recent Labs    10/08/23 1331 10/09/23 1047  PROT 8.4* 6.6  ALBUMIN 3.4* 2.7*  AST 28 27  ALT 24 24  ALKPHOS 230* 184*  BILITOT 0.5 0.4  BILIDIR  --  <0.1  IBILI  --  NOT CALCULATED   Studies/Results: CT Renal Stone Study Result Date: 10/08/2023 CLINICAL DATA:  Abdominal and flank pain, acute renal insufficiency, hyperkalemia EXAM: CT ABDOMEN AND PELVIS WITHOUT CONTRAST TECHNIQUE: Multidetector CT imaging of the abdomen and pelvis was performed following the standard protocol without IV contrast. RADIATION DOSE REDUCTION: This exam was performed according to the departmental dose-optimization program which includes automated exposure control, adjustment of the mA and/or kV according to patient size and/or use of iterative reconstruction technique. COMPARISON:  06/18/2008, 09/21/2023 FINDINGS: Lower chest: There is patchy consolidation within the medial segment of the right middle lobe, which could reflect atelectasis or bronchopneumonia. No effusion. Hepatobiliary: Prior cholecystectomy with expected postsurgical dilatation of the common bile duct. There is mild periportal edema, nonspecific. Otherwise unremarkable unenhanced appearance of the liver. Pancreas: Unremarkable unenhanced appearance. Spleen: Unremarkable unenhanced appearance. Adrenals/Urinary Tract: No urinary tract calculi or obstructive uropathy within either kidney. The adrenals and bladder are grossly unremarkable. Stomach/Bowel: No bowel obstruction or ileus. Moderate stool within the proximal colon compatible with constipation. Normal appendix right lower quadrant. No bowel  wall thickening or inflammatory change. Vascular/Lymphatic: Aortic atherosclerosis. No enlarged abdominal or pelvic lymph nodes. Reproductive: Stable 3.2 cm simple cysts within the right ovary. The uterus and left adnexa are unremarkable. Other: No free fluid or free intraperitoneal gas. No abdominal wall hernia. Musculoskeletal: There are  no acute displaced fractures. There are no destructive bony lesions. There are no CT abnormalities involving the right sacroiliac joint to correspond with previous MRI findings. Specifically, no evidence of abscess or osteomyelitis. Reconstructed images demonstrate no additional findings. IMPRESSION: 1. Moderate stool throughout the colon consistent with constipation. No bowel obstruction or ileus. 2. Mild periportal edema throughout the liver, minute specific. Please correlate with liver function tests. Assessment of the liver parenchyma is limited without IV contrast. 3. No CT findings to correspond with the suspected septic arthritis of the right SI joint on the 09/21/2023 MRI. Specifically, no bony destruction or fluid collection. 4.  Aortic Atherosclerosis (ICD10-I70.0). Electronically Signed   By: Ozell Daring M.D.   On: 10/08/2023 16:20    Assessment: Jamie Evans is a 53 year old female with history of substance abuse (last use, cocaine/fentanyl  September 20, 2023), narcotic dependence, chronic HCV genotype 3, B12 and folate deficiency (noted 08/2023 not clear she is taking supplements), anxiety, hypertension, GERD, SI joint septic arthritis (8/22)presenting from PCP office with hyperkalemia and AKI and ongoing epigastric pain, poor oral intake.   Epigastric pain/nausea/poor PO intake: -onset around discharge from hospital on 8/25 -ongoing postprandial epigastric pain with some reflux symptoms -vomiting initially but none for the past 4-5 days -admits to recent drug use, remote ETOH abuse -ongoing NSAID/ASA use as well -history of prior cholecystectomy -non contrast CT renal stone study with nonsepcific mild periportal edema of unclear significance -alk phos increased (appears chronic), unclear if this is of liver source though can be further worked up as outpatient  Our team has recommended EGD for further evaluation, though given UDS this admission positive for cocaine, will likely need  to pursue this on outpatient basis once UDS is negative, which I discussed with the patient and her family at bedside.   Constipation: -in setting of methadone /opiates -CT this admission with moderate stool throughout colon  -started on linzess183mcg daily, yesterday and maintained on Miralax  daily -last BM was 9/5 -consider addition of movantik if needed  Normocytic anemia with B12/folate deficiency: -no overt GI bleeding -unclear if patient is taking recommended B12/folate supplements -intrinsic factor Ab in process, APCA in process -iron 59, TIBC 327, saturation 18, ferritin 104 -hgb stable at 11.7, improved from 10.8 yesterday  Chronic Hepatitis C: -genotype 3 -consider outpatient treatment    Plan: -linzess  145mcg daily -miralax  17g daily -soft diet today -outpatient hepatitis C treatment -outpatient workup of elevated Alk phos -EGD once UDS clean, likely as outpatient  -trend h&h, monitor for overt GI bleeding -follow for results of IF Ab and APCA -B12 and folate supplementation per hospitalist  -PPI BID -Carafate  1g QID -outpatient colonoscopy for screening -consider addition of movantik for constipation if not improving with linzess /miralax    LOS: 1 day    10/10/2023, 9:09 AM   Deserea Bordley L. Renezmae Canlas, MSN, APRN, AGNP-C Adult-Gerontology Nurse Practitioner [facility]

## 2023-10-10 NOTE — Progress Notes (Signed)
 Discharge instructions reviewed with patient, patient verbalized understanding of instruction.  Patient discharged home with family in stable condition.

## 2023-10-10 NOTE — Progress Notes (Signed)
 MEWS Progress Note  Patient Details Name: MYRIKAL MESSMER MRN: [id number] DOB: 1970/07/17 Today's Date: 10/10/2023   MEWS Flowsheet Documentation:  Assess: MEWS Score Temp: 98 F (36.7 C) BP: (!) 211/106 MAP (mmHg): 137 Pulse Rate: 89 ECG Heart Rate: 87 Resp: 16 Level of Consciousness: Alert SpO2: 95 % O2 Device: Room Air Assess: MEWS Score MEWS Temp: 0 MEWS Systolic: 2 MEWS Pulse: 0 MEWS RR: 0 MEWS LOC: 0 MEWS Score: 2 MEWS Score Color: Yellow Assess: SIRS CRITERIA SIRS Temperature : 0 SIRS Respirations : 0 SIRS Pulse: 0 SIRS WBC: 0 SIRS Score Sum : 0 SIRS Temperature : 0 SIRS Pulse: 0 SIRS Respirations : 0 SIRS WBC: 0 SIRS Score Sum : 0 Assess: if the MEWS score is Yellow or Red Were vital signs accurate and taken at a resting state?: Yes Does the patient meet 2 or more of the SIRS criteria?: No MEWS guidelines implemented : Yes, yellow Treat MEWS Interventions: Considered administering scheduled or prn medications/treatments as ordered Take Vital Signs Increase Vital Sign Frequency : Yellow: Q2hr x1, continue Q4hrs until patient remains green for 12hrs Escalate MEWS: Escalate: Yellow: Discuss with charge nurse and consider notifying provider and/or RRT Notify: Charge Nurse/RN Name of Charge Nurse/RN Notified: Inocente, RN Provider Notification Provider Name/Title: Posey Maier, MD Date Provider Notified: 10/10/23 Time Provider Notified: [phone] Method of Notification: Page Notification Reason: Other (Comment) (Elevated BP) Provider response: No new orders Date of Provider Response: 10/10/23      Andrea Mayer 10/10/2023, 6:33 AM

## 2023-10-10 NOTE — Discharge Instructions (Signed)
IMPORTANT INFORMATION: PAY CLOSE ATTENTION   PHYSICIAN DISCHARGE INSTRUCTIONS  Follow with Primary care provider  McElroy, Shannon, PA-C  and other consultants as instructed by your Hospitalist Physician  SEEK MEDICAL CARE OR RETURN TO EMERGENCY ROOM IF SYMPTOMS COME BACK, WORSEN OR NEW PROBLEM DEVELOPS   Please note: You were cared for by a hospitalist during your hospital stay. Every effort will be made to forward records to your primary care provider.  You can request that your primary care provider send for your hospital records if they have not received them.  Once you are discharged, your primary care physician will handle any further medical issues. Please note that NO REFILLS for any discharge medications will be authorized once you are discharged, as it is imperative that you return to your primary care physician (or establish a relationship with a primary care physician if you do not have one) for your post hospital discharge needs so that they can reassess your need for medications and monitor your lab values.  Please get a complete blood count and chemistry panel checked by your Primary MD at your next visit, and again as instructed by your Primary MD.  Get Medicines reviewed and adjusted: Please take all your medications with you for your next visit with your Primary MD  Laboratory/radiological data: Please request your Primary MD to go over all hospital tests and procedure/radiological results at the follow up, please ask your primary care provider to get all Hospital records sent to his/her office.  In some cases, they will be blood work, cultures and biopsy results pending at the time of your discharge. Please request that your primary care provider follow up on these results.  If you are diabetic, please bring your blood sugar readings with you to your follow up appointment with primary care.    Please call and make your follow up appointments as soon as possible.    Also  Note the following: If you experience worsening of your admission symptoms, develop shortness of breath, life threatening emergency, suicidal or homicidal thoughts you must seek medical attention immediately by calling 911 or calling your MD immediately  if symptoms less severe.  You must read complete instructions/literature along with all the possible adverse reactions/side effects for all the Medicines you take and that have been prescribed to you. Take any new Medicines after you have completely understood and accpet all the possible adverse reactions/side effects.   Do not drive when taking Pain medications or sleeping medications (Benzodiazepines)  Do not take more than prescribed Pain, Sleep and Anxiety Medications. It is not advisable to combine anxiety,sleep and pain medications without talking with your primary care practitioner  Special Instructions: If you have smoked or chewed Tobacco  in the last 2 yrs please stop smoking, stop any regular Alcohol  and or any Recreational drug use.  Wear Seat belts while driving.  Do not drive if taking any narcotic, mind altering or controlled substances or recreational drugs or alcohol.       

## 2023-10-10 NOTE — Progress Notes (Signed)
 Pt currently a Yellow [place] due to elevated BP. Gave PRN Hydralazine , rechecked vitals - BP currently 136/97. MD made aware. Pt currently sleeping, rise and fall of chest visualized.

## 2023-10-10 NOTE — Progress Notes (Signed)
 PHARMACY NOTE:  ANTIMICROBIAL RENAL DOSAGE ADJUSTMENT  Current antimicrobial regimen includes a mismatch between antimicrobial dosage and estimated renal function.  As per policy approved by the Pharmacy & Therapeutics and Medical Executive Committees, the antimicrobial dosage will be adjusted accordingly.  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 []      On intermittent HD, scheduled: []      On CRRT    Antimicrobial dosage has been changed to:  Cefepime  2gm IV q12h  Additional comments:   Thank you for allowing pharmacy to be a part of this patient's care.  Shaneque Merkle, PharmD, BCPS, BCIDP Work Cell: [phone] 10/10/2023 10:52 AM

## 2023-10-10 NOTE — Discharge Summary (Signed)
 Physician Discharge Summary  Jamie Evans FMW:[id number] DOB: 04/06/1970 DOA: 10/08/2023  PCP: Comer Kirsch, PA-C GI: [facility]   Admit date: 10/08/2023 Discharge date: 10/10/2023  Admitted From:  HOME  Disposition: HOME   Recommendations for Outpatient Follow-up:  Follow up with PCP in 1-2 weeks Follow up with [facility] in 4 weeks  Please obtain BMP/CBC in 1-2 weeks Please follow up on the following pending results:  Discharge Condition: STABLE   CODE STATUS: FULL DIET: soft foods, advance as tolerated    Brief Hospitalization Summary: Please see all hospital notes, images, labs for full details of the hospitalization. Admission provider HPI:  Jamie Evans is a 53 y.o. female with medical history significant for Narcotic dependence, HTN, SI joint septic arthritis. Patient was sent to the ED from PCPs office with reports of high potassium and AKI.  Patient reports upper abdominal pain started shortly after leaving the hospital 8/26, he describes sharp persistent abdominal pain that is worse with food and improves with Alka-Seltzer.  She reports intermittent episodes of vomiting, but without blood or coffee grounds.  Reports constipation that improved after MiraLAX , with bowel movements without blood.  She reports poor oral intake due to abdominal pain.  No urinary symptoms.   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  Blood cultures grew Serratia.  Neurosurgeon and Ortho, recommended nonoperative management.  ID recommended 28 days of Bactrim .    ED Course: Temperature 98.2.  Heart rate 96.  Respirate rate 14.  Blood pressure 150/106.  O2 sat 97% on room air.  Creatinine elevated 2.84, potassium 5.2. CT renal stone study-shows constipation patient, mild periportal edema throughout the liver.  No CT findings to correspond with the suspected septic arthritis of the right SI joint on the 09/21/2023 MRI. Specifically, no bony destruction  or fluid collection. 2L bolus given.  HOSPITAL COURSE BY LISTED PROBLEMS ADDRESSED   AKI (acute kidney injury) -- IMPROVED  - Likely due to dehydration, secondary to nausea vomiting, abdominal pain Accommodation of medication of Bactrim  and losartan  Lab Results  Component Value Date   CREATININE 1.42 (H) 10/10/2023   CREATININE 1.97 (H) 10/09/2023   CREATININE 1.95 (H) 10/09/2023   - Improved with IV fluids - IV fluids given with improvement in renal function  - Hold Bactrim , losartan    Septic arthritis of sacroiliac joint  With cultures growing Serratia-pansensitive.  - CT renal stone study today no CT findings to correspond with suspected septic arthritis of right SI joint, compared to MRI 09/21/2023. - Was discharged 8/26 to complete 28-day course of Bactrim -held for now with AKI -Continue course for now while inpatient with IV cefepime  -switched to p.o. ciprofloxacin  on discharge, follow up with ID on 9/22 as scheduled     Anxiety and depression - Resume Elavil ,  - resume Valium  at reduced dose 2.5 mg q8h.  - Patient reports she needs the Valium  for anxiety, but daughter reports the 5 mg dose made patient sleepy    Abdominal pain - Mainly epigastric, associated with nausea vomiting poor p.o. intake - Denies of having any hematochezia or melena  - Hgb stable.   - CT renal stone study- not suggestive of abdominal pathology or etiology - Continue IV Protonix  40 twice daily - Consult GI-recommending PPI twice daily, EGD after potassium correction -tolerating soft food diet, advance as tolerated  - Will continue with Carafate    Hyperkalemia  -treated and resolved   chronic narcotic dependence --resume home pain management regimen  Essential hypertension Elevated.  Systolic 150s up to 203. -Hold losartan  with AKI -amlodipine  10 mg daily ordered    Discharge Diagnoses:  Principal Problem:   AKI (acute kidney injury) (HCC) Active Problems:   Essential  hypertension   Chronic narcotic dependence (HCC)   Abdominal pain   Anxiety and depression   Septic arthritis of sacroiliac joint (HCC)   Nausea without vomiting   Chronic idiopathic constipation   Discharge Instructions:  Allergies as of 10/10/2023       Reactions   Hydrocodone Nausea And Vomiting   Penicillins Nausea Only        Medication List     STOP taking these medications    losartan  50 MG tablet Commonly known as: COZAAR    sulfamethoxazole -trimethoprim  800-160 MG tablet Commonly known as: BACTRIM  DS       TAKE these medications    amitriptyline  25 MG tablet Commonly known as: ELAVIL  Take 1 tablet (25 mg total) by mouth at bedtime.   amLODipine  10 MG tablet Commonly known as: NORVASC  Take 1 tablet (10 mg total) by mouth daily. Start taking on: October 11, 2023   ciprofloxacin  500 MG tablet Commonly known as: Cipro  Take 1 tablet (500 mg total) by mouth 2 (two) times daily for 12 days. Start taking on: October 11, 2023   cyanocobalamin  500 MCG tablet Commonly known as: VITAMIN B12 Take 1 tablet (500 mcg total) by mouth daily.   diazepam  5 MG tablet Commonly known as: VALIUM  Take 1 tablet (5 mg total) by mouth every 8 (eight) hours.   gabapentin  300 MG capsule Commonly known as: NEURONTIN  Take 1 capsule (300 mg total) by mouth 3 (three) times daily.   linaclotide  145 MCG Caps capsule Commonly known as: LINZESS  Take 1 capsule (145 mcg total) by mouth daily before breakfast. Start taking on: October 11, 2023   methadone  10 MG/ML solution Commonly known as: DOLOPHINE  Take 105 mg by mouth daily. [facility]-[phone] speak with Angie or Suzy.   ondansetron  8 MG disintegrating tablet Commonly known as: ZOFRAN -ODT Take 1 tablet (8 mg total) by mouth every 8 (eight) hours as needed for nausea or vomiting.   oxyCODONE  5 MG immediate release tablet Commonly known as: Oxy IR/ROXICODONE  Take 1 tablet (5 mg total) by  mouth every 4 (four) hours as needed for moderate pain (pain score 4-6).   pantoprazole  40 MG tablet Commonly known as: PROTONIX  Take 1 tablet (40 mg total) by mouth 2 (two) times daily before a meal.   polyethylene glycol 17 g packet Commonly known as: MIRALAX  / GLYCOLAX  Take 17 g by mouth daily. Start taking on: October 11, 2023   sucralfate  1 g tablet Commonly known as: Carafate  Take 1 tablet (1 g total) by mouth 4 (four) times daily.        Follow-up Information     Comer Kirsch, PA-C. Schedule an appointment as soon as possible for a visit in 1 week(s).   Specialty: Physician Assistant Why: Hospital Follow Up Contact information: [address] [place] [address] [phone]         [facility]. Schedule an appointment as soon as possible for a visit in 1 month(s).   Specialty: Gastroenterology Why: Hospital Follow Up Contact information: [address] [place]  [address] [phone]               Allergies  Allergen Reactions   Hydrocodone Nausea And Vomiting   Penicillins Nausea  Only   Allergies as of 10/10/2023       Reactions   Hydrocodone Nausea And Vomiting   Penicillins Nausea Only        Medication List     STOP taking these medications    losartan  50 MG tablet Commonly known as: COZAAR    sulfamethoxazole -trimethoprim  800-160 MG tablet Commonly known as: BACTRIM  DS       TAKE these medications    amitriptyline  25 MG tablet Commonly known as: ELAVIL  Take 1 tablet (25 mg total) by mouth at bedtime.   amLODipine  10 MG tablet Commonly known as: NORVASC  Take 1 tablet (10 mg total) by mouth daily. Start taking on: October 11, 2023   ciprofloxacin  500 MG tablet Commonly known as: Cipro  Take 1 tablet (500 mg total) by mouth 2 (two) times daily for 12 days. Start taking on: October 11, 2023   cyanocobalamin  500 MCG tablet Commonly known as:  VITAMIN B12 Take 1 tablet (500 mcg total) by mouth daily.   diazepam  5 MG tablet Commonly known as: VALIUM  Take 1 tablet (5 mg total) by mouth every 8 (eight) hours.   gabapentin  300 MG capsule Commonly known as: NEURONTIN  Take 1 capsule (300 mg total) by mouth 3 (three) times daily.   linaclotide  145 MCG Caps capsule Commonly known as: LINZESS  Take 1 capsule (145 mcg total) by mouth daily before breakfast. Start taking on: October 11, 2023   methadone  10 MG/ML solution Commonly known as: DOLOPHINE  Take 105 mg by mouth daily. [facility]-[phone] speak with Angie or Suzy.   ondansetron  8 MG disintegrating tablet Commonly known as: ZOFRAN -ODT Take 1 tablet (8 mg total) by mouth every 8 (eight) hours as needed for nausea or vomiting.   oxyCODONE  5 MG immediate release tablet Commonly known as: Oxy IR/ROXICODONE  Take 1 tablet (5 mg total) by mouth every 4 (four) hours as needed for moderate pain (pain score 4-6).   pantoprazole  40 MG tablet Commonly known as: PROTONIX  Take 1 tablet (40 mg total) by mouth 2 (two) times daily before a meal.   polyethylene glycol 17 g packet Commonly known as: MIRALAX  / GLYCOLAX  Take 17 g by mouth daily. Start taking on: October 11, 2023   sucralfate  1 g tablet Commonly known as: Carafate  Take 1 tablet (1 g total) by mouth 4 (four) times daily.        Procedures/Studies: CT Renal Stone Study Result Date: 10/08/2023 CLINICAL DATA:  Abdominal and flank pain, acute renal insufficiency, hyperkalemia EXAM: CT ABDOMEN AND PELVIS WITHOUT CONTRAST TECHNIQUE: Multidetector CT imaging of the abdomen and pelvis was performed following the standard protocol without IV contrast. RADIATION DOSE REDUCTION: This exam was performed according to the departmental dose-optimization program which includes automated exposure control, adjustment of the mA and/or kV according to patient size and/or use of iterative reconstruction  technique. COMPARISON:  06/18/2008, 09/21/2023 FINDINGS: Lower chest: There is patchy consolidation within the medial segment of the right middle lobe, which could reflect atelectasis or bronchopneumonia. No effusion. Hepatobiliary: Prior cholecystectomy with expected postsurgical dilatation of the common bile duct. There is mild periportal edema, nonspecific. Otherwise unremarkable unenhanced appearance of the liver. Pancreas: Unremarkable unenhanced appearance. Spleen: Unremarkable unenhanced appearance. Adrenals/Urinary Tract: No urinary tract calculi or obstructive uropathy within either kidney. The adrenals and bladder are grossly unremarkable. Stomach/Bowel: No bowel obstruction or ileus. Moderate stool within the proximal colon compatible with constipation. Normal appendix right lower quadrant. No bowel wall thickening or inflammatory change. Vascular/Lymphatic: Aortic atherosclerosis. No  enlarged abdominal or pelvic lymph nodes. Reproductive: Stable 3.2 cm simple cysts within the right ovary. The uterus and left adnexa are unremarkable. Other: No free fluid or free intraperitoneal gas. No abdominal wall hernia. Musculoskeletal: There are no acute displaced fractures. There are no destructive bony lesions. There are no CT abnormalities involving the right sacroiliac joint to correspond with previous MRI findings. Specifically, no evidence of abscess or osteomyelitis. Reconstructed images demonstrate no additional findings. IMPRESSION: 1. Moderate stool throughout the colon consistent with constipation. No bowel obstruction or ileus. 2. Mild periportal edema throughout the liver, minute specific. Please correlate with liver function tests. Assessment of the liver parenchyma is limited without IV contrast. 3. No CT findings to correspond with the suspected septic arthritis of the right SI joint on the 09/21/2023 MRI. Specifically, no bony destruction or fluid collection. 4.  Aortic Atherosclerosis  (ICD10-I70.0). Electronically Signed   By: Ozell Daring M.D.   On: 10/08/2023 16:20   ECHOCARDIOGRAM COMPLETE Result Date: 09/24/2023    ECHOCARDIOGRAM REPORT   Patient Name:   Jamie Evans Date of Exam: 09/24/2023 Medical Rec #:  [id number]       Height:       64.0 in Accession #:    [id number]      Weight:       138.4 lb Date of Birth:  05-12-1970        BSA:          1.673 m Patient Age:    53 years        BP:           162/102 mmHg Patient Gender: F               HR:           71 bpm. Exam Location:  [facility] Procedure: 2D Echo, Cardiac Doppler and Color Doppler (Both Spectral and Color            Flow Doppler were utilized during procedure). Indications:    Bacteremia R78.81  History:        Patient has prior history of Echocardiogram examinations, most                 recent 10/06/2015. Risk Factors:Current Smoker and Hypertension.                 Hx of substance abuse and IV drug abuse.  Sonographer:    Aida Pizza RCS Referring Phys: [phone] DAVID TAT IMPRESSIONS  1. Left ventricular ejection fraction, by estimation, is 60 to 65%. The left ventricle has normal function. The left ventricle has no regional wall motion abnormalities. Left ventricular diastolic parameters were normal.  2. Right ventricular systolic function is normal. The right ventricular size is normal. Tricuspid regurgitation signal is inadequate for assessing PA pressure.  3. The mitral valve is normal in structure. Trivial mitral valve regurgitation. No evidence of mitral stenosis.  4. The tricuspid valve is abnormal.  5. The aortic valve is tricuspid. Aortic valve regurgitation is not visualized. No aortic stenosis is present.  6. The inferior vena cava is dilated in size with >50% respiratory variability, suggesting right atrial pressure of 8 mmHg. FINDINGS  Left Ventricle: Left ventricular ejection fraction, by estimation, is 60 to 65%. The left ventricle has normal function. The left ventricle has no regional wall motion  abnormalities. The left ventricular internal cavity size was normal in size. There is  no left ventricular hypertrophy. Left ventricular diastolic parameters were  normal. Right Ventricle: The right ventricular size is normal. Right vetricular wall thickness was not well visualized. Right ventricular systolic function is normal. Tricuspid regurgitation signal is inadequate for assessing PA pressure. Left Atrium: Left atrial size was normal in size. Right Atrium: Right atrial size was normal in size. Pericardium: There is no evidence of pericardial effusion. Mitral Valve: The mitral valve is normal in structure. Trivial mitral valve regurgitation. No evidence of mitral valve stenosis. Tricuspid Valve: The tricuspid valve is abnormal. Tricuspid valve regurgitation is mild . No evidence of tricuspid stenosis. Aortic Valve: The aortic valve is tricuspid. Aortic valve regurgitation is not visualized. No aortic stenosis is present. Aortic valve mean gradient measures 3.3 mmHg. Aortic valve peak gradient measures 8.0 mmHg. Aortic valve area, by VTI measures 2.20 cm. Pulmonic Valve: The pulmonic valve was not well visualized. Pulmonic valve regurgitation is not visualized. No evidence of pulmonic stenosis. Aorta: The aortic root is normal in size and structure. Venous: The inferior vena cava is dilated in size with greater than 50% respiratory variability, suggesting right atrial pressure of 8 mmHg. IAS/Shunts: No atrial level shunt detected by color flow Doppler.  LEFT VENTRICLE PLAX 2D LVIDd:         4.40 cm   Diastology LVIDs:         3.00 cm   LV e' medial:    7.96 cm/s LV PW:         1.00 cm   LV E/e' medial:  14.1 LV IVS:        1.00 cm   LV e' lateral:   10.70 cm/s LVOT diam:     1.90 cm   LV E/e' lateral: 10.5 LV SV:         60 LV SV Index:   36 LVOT Area:     2.84 cm  RIGHT VENTRICLE RV S prime:     16.30 cm/s TAPSE (M-mode): 2.4 cm LEFT ATRIUM             Index        RIGHT ATRIUM           Index LA diam:         4.00 cm 2.39 cm/m   RA Area:     15.00 cm LA Vol (A2C):   71.0 ml 42.43 ml/m  RA Volume:   37.80 ml  22.59 ml/m LA Vol (A4C):   36.6 ml 21.87 ml/m LA Biplane Vol: 55.4 ml 33.11 ml/m  AORTIC VALVE AV Area (Vmax):    2.02 cm AV Area (Vmean):   2.17 cm AV Area (VTI):     2.20 cm AV Vmax:           141.68 cm/s AV Vmean:          81.725 cm/s AV VTI:            0.273 m AV Peak Grad:      8.0 mmHg AV Mean Grad:      3.3 mmHg LVOT Vmax:         101.00 cm/s LVOT Vmean:        62.600 cm/s LVOT VTI:          0.212 m LVOT/AV VTI ratio: 0.78  AORTA Ao Root diam: 3.00 cm MITRAL VALVE MV Area (PHT): 3.72 cm     SHUNTS MV Decel Time: 204 msec     Systemic VTI:  0.21 m MV E velocity: 112.00 cm/s  Systemic Diam: 1.90 cm MV A velocity:  94.30 cm/s MV E/A ratio:  1.19 Dorn Ross MD Electronically signed by Dorn Ross MD Signature Date/Time: 09/24/2023/3:59:03 PM    Final    MR PELVIS W WO CONTRAST Result Date: 09/22/2023 MR PELVIS WITHOUT THEN WITH IV CONTRAST COMPARISON: MRI lumbar spine CLINICAL HISTORY: Abnormal MRI lumbar spine. PULSE SEQUENCES: AX T1, Ax T2 FS, Cor T1, COR STIR & SMALL FOV COR PD FS with and without IV contrast FINDINGS: Bones: There is abnormal edema centered in the right SI joint suspicious for septic arthritis of the right SI joint. Clinical correlation. There is moderate edema identified in the adjacent intrapelvic muscles and extrapelvic muscles on the right. There is no focal enhancing collection to suggest soft tissue abscess at the current time. Otherwise, the visualized pelvis and left SI joint are unremarkable. Proximal femurs are unremarkable. Limited evaluation of the intrapelvic structures demonstrate no significant abnormality. There is likely a functional ovarian cyst on the right measuring 3.5 cm. IMPRESSION: Edema and inflammation centered in the right SI joint likely reflective of septic arthritis. There is moderate reactive edema in the intrapelvic and extrapelvic muscles.  There is no focal drainable collection is suggestion abscess at the current time. Follow-up after conservative treatment recommended to ensure resolution. Electronically signed by: Norleen Satchel MD 09/22/2023 12:28 PM EDT RP Workstation: [id number]   MR THORACIC SPINE W WO CONTRAST Result Date: 09/20/2023 CLINICAL DATA:  Back pain.  Intravenous drug use. EXAM: MRI THORACIC AND LUMBAR SPINE WITHOUT AND WITH CONTRAST TECHNIQUE: Multiplanar and multiecho pulse sequences of the thoracic and lumbar spine were obtained without and with intravenous contrast. CONTRAST:  6mL GADAVIST  GADOBUTROL  1 MMOL/ML IV SOLN COMPARISON:  07/08/2013 FINDINGS: MRI THORACIC SPINE FINDINGS Alignment:  No malalignment. Vertebrae: Benign appearing hemangioma within the T7 vertebral body. No other finding. No evidence of bone infection. Cord:  Normal Paraspinal and other soft tissues: Normal Disc levels: No significant disc level finding. No stenosis of the canal or foramina. Minimal non-compressive disc bulges at T8-9 and T10-11, not likely of any significance. No evidence of facet arthropathy. MRI LUMBAR SPINE FINDINGS Segmentation:  5 lumbar type vertebral bodies. Alignment:  Normal Vertebrae: No fracture or focal bone lesion of significance. Insignificant small a meningioma within the L1 vertebral body. Conus medullaris: Extends to the L1-2 level and appears normal. No worrisome finding. Paraspinal and other soft tissues: Prominent bile duct and pancreatic duct. This may relate to previous cholecystectomy, but is not primarily or completely evaluated. Question if there is some edema and enhancement at the right ileo psoas junction. This could indicate myositis. If there is concern regarding this possibility, consider pelvic MRI with and without contrast. Disc levels: No significant disc level finding. No sign of spinal infection. Minimal non-compressive disc bulge L3-4. No sign of septic facet arthropathy. IMPRESSION: 1. No evidence of  spinal infection in the thoracic or lumbar region. 2. Question if there is some edema and enhancement at the right ileo psoas muscular junction. This could indicate myositis. If there is concern regarding this possibility, consider pelvic MRI with and without contrast. I do not think that the finding is definite based on the limited evaluation provided by this exam. a 3. Prominent bile duct and pancreatic duct. This may relate to previous cholecystectomy, but is not primarily or completely evaluated. Electronically Signed   By: Oneil Officer M.D.   On: 09/20/2023 16:37   MR Lumbar Spine W Wo Contrast Result Date: 09/20/2023 CLINICAL DATA:  Back pain.  Intravenous drug use. EXAM:  MRI THORACIC AND LUMBAR SPINE WITHOUT AND WITH CONTRAST TECHNIQUE: Multiplanar and multiecho pulse sequences of the thoracic and lumbar spine were obtained without and with intravenous contrast. CONTRAST:  6mL GADAVIST  GADOBUTROL  1 MMOL/ML IV SOLN COMPARISON:  07/08/2013 FINDINGS: MRI THORACIC SPINE FINDINGS Alignment:  No malalignment. Vertebrae: Benign appearing hemangioma within the T7 vertebral body. No other finding. No evidence of bone infection. Cord:  Normal Paraspinal and other soft tissues: Normal Disc levels: No significant disc level finding. No stenosis of the canal or foramina. Minimal non-compressive disc bulges at T8-9 and T10-11, not likely of any significance. No evidence of facet arthropathy. MRI LUMBAR SPINE FINDINGS Segmentation:  5 lumbar type vertebral bodies. Alignment:  Normal Vertebrae: No fracture or focal bone lesion of significance. Insignificant small a meningioma within the L1 vertebral body. Conus medullaris: Extends to the L1-2 level and appears normal. No worrisome finding. Paraspinal and other soft tissues: Prominent bile duct and pancreatic duct. This may relate to previous cholecystectomy, but is not primarily or completely evaluated. Question if there is some edema and enhancement at the right ileo  psoas junction. This could indicate myositis. If there is concern regarding this possibility, consider pelvic MRI with and without contrast. Disc levels: No significant disc level finding. No sign of spinal infection. Minimal non-compressive disc bulge L3-4. No sign of septic facet arthropathy. IMPRESSION: 1. No evidence of spinal infection in the thoracic or lumbar region. 2. Question if there is some edema and enhancement at the right ileo psoas muscular junction. This could indicate myositis. If there is concern regarding this possibility, consider pelvic MRI with and without contrast. I do not think that the finding is definite based on the limited evaluation provided by this exam. a 3. Prominent bile duct and pancreatic duct. This may relate to previous cholecystectomy, but is not primarily or completely evaluated. Electronically Signed   By: Oneil Officer M.D.   On: 09/20/2023 16:37     Subjective: Pt reports that she is wanting to go home, she is tolerating soft diet and had 3 bowel movements this morning.    Discharge Exam: Vitals:   10/10/23 0602 10/10/23 0652  BP: (!) 211/106 (!) 136/97  Pulse:  94  Resp:  16  Temp:  98 F (36.7 C)  SpO2:  96%   Vitals:   10/09/23 2108 10/10/23 0549 10/10/23 0602 10/10/23 0652  BP: (!) 159/97 (!) 211/106 (!) 211/106 (!) 136/97  Pulse: 100 89  94  Resp: 18 16  16   Temp: 98.2 F (36.8 C) 98 F (36.7 C)  98 F (36.7 C)  TempSrc: Oral Oral  Oral  SpO2: 98% 95%  96%  Weight:      Height:        General: Pt is alert, awake, not in acute distress Cardiovascular: RRR, S1/S2 +, no rubs, no gallops Respiratory: CTA bilaterally, no wheezing, no rhonchi Abdominal: Soft, NT, ND, bowel sounds + Extremities: no edema, no cyanosis   The results of significant diagnostics from this hospitalization (including imaging, microbiology, ancillary and laboratory) are listed below for reference.     Microbiology: No results found for this or any previous  visit (from the past 240 hours).   Labs: BNP (last 3 results) No results for input(s): BNP in the last 8760 hours. Basic Metabolic Panel: Recent Labs  Lab 10/08/23 1039 10/08/23 1331 10/09/23 0415 10/09/23 1047 10/09/23 1751 10/10/23 0834  NA 132* 135 136 135  --  136  K 5.8* 5.2* 6.0*  5.7* 4.9 4.3  CL 88* 87* 101 98  --  99  CO2 33* 32 29 26  --  24  GLUCOSE 101* 89 93 82  --  97  BUN 48* 46* 31* 27*  --  15  CREATININE 2.93* 2.84* 1.95* 1.97*  --  1.42*  CALCIUM  8.6* 8.9 8.3* 8.7*  --  8.8*  MG  --  2.3  --   --   --   --    Liver Function Tests: Recent Labs  Lab 10/08/23 1331 10/09/23 1047  AST 28 27  ALT 24 24  ALKPHOS 230* 184*  BILITOT 0.5 0.4  PROT 8.4* 6.6  ALBUMIN 3.4* 2.7*   No results for input(s): LIPASE, AMYLASE in the last 168 hours. No results for input(s): AMMONIA in the last 168 hours. CBC: Recent Labs  Lab 10/08/23 1039 10/08/23 1331 10/09/23 0415 10/10/23 0834  WBC 8.6 7.4 7.1 7.7  NEUTROABS  --  4.6  --   --   HGB 12.9 13.2 10.8* 11.7*  HCT 38.5 39.1 33.9* 35.4*  MCV 86.9 87.1 90.4 87.4  PLT 274 296 211 224   Cardiac Enzymes: No results for input(s): CKTOTAL, CKMB, CKMBINDEX, TROPONINI in the last 168 hours. BNP: Invalid input(s): POCBNP CBG: No results for input(s): GLUCAP in the last 168 hours. D-Dimer No results for input(s): DDIMER in the last 72 hours. Hgb A1c No results for input(s): HGBA1C in the last 72 hours. Lipid Profile No results for input(s): CHOL, HDL, LDLCALC, TRIG, CHOLHDL, LDLDIRECT in the last 72 hours. Thyroid function studies No results for input(s): TSH, T4TOTAL, T3FREE, THYROIDAB in the last 72 hours.  Invalid input(s): FREET3 Anemia work up Recent Labs    10/09/23 1047  FERRITIN 104  TIBC 327  IRON 59   Urinalysis    Component Value Date/Time   COLORURINE YELLOW 10/08/2023 1544   APPEARANCEUR CLEAR 10/08/2023 1544   APPEARANCEUR Cloudy (A)  05/19/2014 1503   LABSPEC 1.012 10/08/2023 1544   PHURINE 7.0 10/08/2023 1544   GLUCOSEU NEGATIVE 10/08/2023 1544   HGBUR NEGATIVE 10/08/2023 1544   BILIRUBINUR NEGATIVE 10/08/2023 1544   BILIRUBINUR negative 10/08/2023 1046   BILIRUBINUR Negative 05/19/2014 1503   KETONESUR NEGATIVE 10/08/2023 1544   PROTEINUR 30 (A) 10/08/2023 1544   UROBILINOGEN 1.0 10/08/2023 1046   UROBILINOGEN 0.2 06/17/2008 2235   NITRITE NEGATIVE 10/08/2023 1544   LEUKOCYTESUR NEGATIVE 10/08/2023 1544   Sepsis Labs Recent Labs  Lab 10/08/23 1039 10/08/23 1331 10/09/23 0415 10/10/23 0834  WBC 8.6 7.4 7.1 7.7   Microbiology No results found for this or any previous visit (from the past 240 hours).  Time coordinating discharge:  40 mins   SIGNED:  Afton Louder, MD  [facility] 10/10/2023, 12:24 PM How to contact the [facility] Attending or Consulting provider 7A - 7P or covering provider during after hours 7P -7A, for this patient?  Check the care team in [facility] and look for a) attending/consulting [facility] provider listed and b) the [facility] team listed Log into [email or web address] and use [facility] universal password to access. If you do not have the password, please contact the hospital operator. Locate the [facility] provider you are looking for under [facility] and page to a number that you can be directly reached. If you still have difficulty reaching the provider, please page the [facility] (Director on Call) for the Hospitalists listed on amion for assistance.

## 2023-10-10 NOTE — Plan of Care (Signed)
  Problem: Education: Goal: Knowledge of General Education information will improve Description: Including pain rating scale, medication(s)/side effects and non-pharmacologic comfort measures Outcome: Progressing   Problem: Clinical Measurements: Goal: Will remain free from infection Outcome: Progressing Goal: Diagnostic test results will improve Outcome: Progressing   Problem: Activity: Goal: Risk for activity intolerance will decrease Outcome: Progressing   Problem: Coping: Goal: Level of anxiety will decrease Outcome: Progressing   Problem: Pain Managment: Goal: General experience of comfort will improve and/or be controlled Outcome: Progressing

## 2023-10-11 ENCOUNTER — Inpatient Hospital Stay: Payer: Self-pay | Admitting: Gynecologic Oncology

## 2023-10-11 NOTE — Telephone Encounter (Signed)
 Hospital follow up visit scheduled

## 2023-10-15 ENCOUNTER — Ambulatory Visit: Payer: Self-pay | Admitting: Gastroenterology

## 2023-10-15 ENCOUNTER — Telehealth: Payer: Self-pay

## 2023-10-15 NOTE — Telephone Encounter (Signed)
 Called to follow up with client that was sent to the hospital for evaluation on 10/08/23 from [facility] provider after review of her recent labs and was admitted on 10/08/23 and discharged on 10/10/23. We reviewed all of her follow up appointments scheduled. Next appointment is this week 10/18/23 with Jamie Evans. She reports she is feeling better, she is drinking more water  and she reports she has been having small bowel movements each day. She reports she has been also eating vegetables and fruits, not a lot of meats. She still reports at times, when she swallows reports she has difficulty and sometimes vomits. She denies problems. Reviewed Medications. Will continue to follow client. She was reminded to call Care connect for any questions or needs.   Jamie Evans Skeen RN [employer]

## 2023-10-17 ENCOUNTER — Telehealth: Payer: Self-pay | Admitting: Student

## 2023-10-17 NOTE — Telephone Encounter (Signed)
 Pt called stating she has been taking antibiotic and now has a yeast infection. LPN recommended for pt to try OTC 7-day monistat. Pt verbalized understanding and is to call if no improvement after treatment.

## 2023-10-18 ENCOUNTER — Encounter (INDEPENDENT_AMBULATORY_CARE_PROVIDER_SITE_OTHER): Payer: Self-pay | Admitting: Gastroenterology

## 2023-10-18 ENCOUNTER — Telehealth (INDEPENDENT_AMBULATORY_CARE_PROVIDER_SITE_OTHER): Payer: Self-pay | Admitting: Gastroenterology

## 2023-10-18 ENCOUNTER — Ambulatory Visit (INDEPENDENT_AMBULATORY_CARE_PROVIDER_SITE_OTHER): Payer: Self-pay | Admitting: Gastroenterology

## 2023-10-18 VITALS — BP 113/77 | HR 91 | Temp 97.1°F | Ht 64.0 in | Wt 132.4 lb

## 2023-10-18 DIAGNOSIS — B182 Chronic viral hepatitis C: Secondary | ICD-10-CM

## 2023-10-18 DIAGNOSIS — R748 Abnormal levels of other serum enzymes: Secondary | ICD-10-CM

## 2023-10-18 DIAGNOSIS — Z1212 Encounter for screening for malignant neoplasm of rectum: Secondary | ICD-10-CM

## 2023-10-18 DIAGNOSIS — K5903 Drug induced constipation: Secondary | ICD-10-CM | POA: Insufficient documentation

## 2023-10-18 DIAGNOSIS — R1310 Dysphagia, unspecified: Secondary | ICD-10-CM

## 2023-10-18 DIAGNOSIS — K5900 Constipation, unspecified: Secondary | ICD-10-CM

## 2023-10-18 DIAGNOSIS — R1013 Epigastric pain: Secondary | ICD-10-CM

## 2023-10-18 DIAGNOSIS — F1410 Cocaine abuse, uncomplicated: Secondary | ICD-10-CM

## 2023-10-18 NOTE — Telephone Encounter (Signed)
 At office visit pt stated the pharmacy did not fill Linzess  due to it being over $600
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
## 2023-10-18 NOTE — Patient Instructions (Addendum)
-  Please continue protonix  40mg  daily -We will schedule US  of the liver and do further labs prior to starting Hep C treatment -Continue to avoid all illicit drug use -We will check urine drug screen prior to scheduling EGD and Colonoscopy and again the day before, this must be negative of non prescribed drugs to proceed with these test -make sure you are taking B12 and folic acid  supplements as these were low, please follow up with PCP regarding monitoring of these -Increase water  intake, aim for atleast 64 oz per day Increase fruits, veggies and whole grains, kiwi and prunes are especially good for constipation -let me know if pharmacy has the linzess  sent while you were admitted, otherwise I can resend this  Follow up 2 months  It was a pleasure to see you today. I want to create trusting relationships with patients and provide genuine, compassionate, and quality care. I truly value your feedback! please be on the lookout for a survey regarding your visit with me today. I appreciate your input about our visit and your time in completing this!    Denaya Horn L. Rory Montel, MSN, APRN, AGNP-C Adult-Gerontology Nurse Practitioner [facility] at [facility]

## 2023-10-19 ENCOUNTER — Telehealth (INDEPENDENT_AMBULATORY_CARE_PROVIDER_SITE_OTHER): Payer: Self-pay

## 2023-10-19 NOTE — Telephone Encounter (Signed)
 Scheduled patient for and ultrasound, called patient and let her know of appointment on 10/30/2023 at 9:30am.

## 2023-10-22 ENCOUNTER — Ambulatory Visit: Payer: Self-pay | Admitting: Physician Assistant

## 2023-10-22 ENCOUNTER — Ambulatory Visit: Payer: Self-pay | Admitting: Internal Medicine

## 2023-10-22 ENCOUNTER — Telehealth (INDEPENDENT_AMBULATORY_CARE_PROVIDER_SITE_OTHER): Payer: Self-pay | Admitting: Gastroenterology

## 2023-10-22 ENCOUNTER — Telehealth: Payer: Self-pay

## 2023-10-22 ENCOUNTER — Other Ambulatory Visit (INDEPENDENT_AMBULATORY_CARE_PROVIDER_SITE_OTHER): Payer: Self-pay | Admitting: Gastroenterology

## 2023-10-22 ENCOUNTER — Encounter: Payer: Self-pay | Admitting: Physician Assistant

## 2023-10-22 ENCOUNTER — Encounter (INDEPENDENT_AMBULATORY_CARE_PROVIDER_SITE_OTHER): Payer: Self-pay

## 2023-10-22 VITALS — BP 126/87 | HR 79 | Temp 97.7°F

## 2023-10-22 DIAGNOSIS — F1990 Other psychoactive substance use, unspecified, uncomplicated: Secondary | ICD-10-CM

## 2023-10-22 DIAGNOSIS — F17200 Nicotine dependence, unspecified, uncomplicated: Secondary | ICD-10-CM

## 2023-10-22 DIAGNOSIS — Z5971 Insufficient health insurance coverage: Secondary | ICD-10-CM

## 2023-10-22 DIAGNOSIS — I10 Essential (primary) hypertension: Secondary | ICD-10-CM

## 2023-10-22 DIAGNOSIS — K5903 Drug induced constipation: Secondary | ICD-10-CM

## 2023-10-22 MED ORDER — NALOXEGOL OXALATE 12.5 MG PO TABS: 12.5000 mg | ORAL_TABLET | Freq: Every day | ORAL | 3 refills | Status: AC

## 2023-10-22 NOTE — Telephone Encounter (Signed)
 [facility] Stana Bruns, LPN) that pt is  enrolled into our program and could be assisted with medications. However the pt will have to return required documents for her application to be successfully approved for [facility].  We would need to find out if pt is having difficulty with paying for prescribed medications and to clarify if she plans to receive  any additional assistance with from a family member/friend to assist  with paying for medications,  Once this information is obtained thn, our organization will be able to know to move forward with being able to know how to further assist the patient with any additional patient medication needs, if and when needed

## 2023-10-22 NOTE — Progress Notes (Signed)
 BP 126/87   Pulse 79   Temp 97.7 F (36.5 C)   LMP 10/11/2014   SpO2 98%    Subjective:    Patient ID: [name], female    DOB: [birth year], 53 y.o.   MRN: [id number]  HPI: Jamie Evans is a 53 y.o. female presenting on 10/22/2023 for No chief complaint on file.   HPI   Pt is in today for routine follow up.  She is still Staying with daughter.  She says she has had No cocaine since August- she says it's still in her systerm.  She is Scheduled for colonoscopy/egd sept 30  She says she will be finishing her antibiotic soon.  She says her arms are improved.  She continues with methadone  clinic.    Her only complaint today is not being able to afford meds prescribed by GI.      Relevant past medical, surgical, family and social history reviewed and updated as indicated. Interim medical history since our last visit reviewed. Allergies and medications reviewed and updated.    Current Outpatient Medications:    naloxegol  oxalate (MOVANTIK ) 12.5 MG TABS tablet, Take 1 tablet (12.5 mg total) by mouth daily., Disp: 30 tablet, Rfl: 3   amitriptyline  (ELAVIL ) 25 MG tablet, Take 1 tablet (25 mg total) by mouth at bedtime., Disp: 30 tablet, Rfl: 0   amLODipine  (NORVASC ) 10 MG tablet, Take 1 tablet (10 mg total) by mouth daily., Disp: 30 tablet, Rfl: 1   ciprofloxacin  (CIPRO ) 500 MG tablet, Take 1 tablet (500 mg total) by mouth 2 (two) times daily for 12 days., Disp: 24 tablet, Rfl: 0   diazepam  (VALIUM ) 5 MG tablet, Take 1 tablet (5 mg total) by mouth every 8 (eight) hours., Disp: 12 tablet, Rfl: 0   gabapentin  (NEURONTIN ) 300 MG capsule, Take 1 capsule (300 mg total) by mouth 3 (three) times daily., Disp: 90 capsule, Rfl: 0   linaclotide  (LINZESS ) 145 MCG CAPS capsule, Take 1 capsule (145 mcg total) by mouth daily before breakfast. (Patient not taking: Reported on 10/18/2023), Disp: 30 capsule, Rfl: 2   methadone  (DOLOPHINE ) 10 MG/ML solution, Take 105 mg by mouth daily. [phone] speak with Angie or Suzy. (Patient taking differently: Take 120 mg by mouth daily. [phone] speak with Angie or Suzy.), Disp: , Rfl:    ondansetron  (ZOFRAN -ODT) 8 MG disintegrating tablet, Take 1 tablet (8 mg total) by mouth every 8 (eight) hours as needed for nausea or vomiting., Disp: 20 tablet, Rfl: 0   oxyCODONE  (OXY IR/ROXICODONE ) 5 MG immediate release tablet, Take 1 tablet (5 mg total) by mouth every 4 (four) hours as needed for moderate pain (pain score 4-6)., Disp: 20 tablet, Rfl: 0   pantoprazole  (PROTONIX ) 40 MG tablet, Take 1 tablet (40 mg total) by mouth 2 (two) times daily before a meal. (Patient taking differently: Take 40 mg by mouth QID.), Disp: 60 tablet, Rfl: 0   polyethylene glycol (MIRALAX  / GLYCOLAX ) 17 g packet, Take 17 g by mouth daily., Disp: 30 each, Rfl: 2   vitamin B-12 (VITAMIN B12) 500 MCG tablet, Take 1 tablet (500 mcg total) by mouth daily., Disp: , Rfl:    Current Outpatient Medications:    naloxegol  oxalate (MOVANTIK ) 12.5 MG TABS tablet, Take 1 tablet (12.5 mg total) by mouth daily., Disp: 30 tablet, Rfl: 3   amitriptyline  (ELAVIL ) 25 MG tablet, Take 1 tablet (25 mg total) by mouth at bedtime., Disp: 30  tablet, Rfl: 0   amLODipine  (NORVASC ) 10 MG tablet, Take 1 tablet (10 mg total) by mouth daily., Disp: 30 tablet, Rfl: 1   ciprofloxacin  (CIPRO ) 500 MG tablet, Take 1 tablet (500 mg total) by mouth 2 (two) times daily for 12 days., Disp: 24 tablet, Rfl: 0   diazepam  (VALIUM ) 5 MG tablet, Take 1 tablet (5 mg total) by mouth every 8 (eight) hours., Disp: 12 tablet, Rfl: 0   gabapentin  (NEURONTIN ) 300 MG capsule, Take 1 capsule (300 mg total) by mouth 3 (three) times daily., Disp: 90 capsule, Rfl: 0   linaclotide  (LINZESS ) 145 MCG CAPS capsule, Take 1 capsule (145 mcg total) by mouth daily before breakfast. (Patient not taking: Reported on 10/18/2023), Disp: 30 capsule, Rfl: 2   methadone   (DOLOPHINE ) 10 MG/ML solution, Take 105 mg by mouth daily. [facility]-[phone] speak with Angie or Suzy. (Patient taking differently: Take 120 mg by mouth daily. [facility]-[phone] speak with Angie or Suzy.), Disp: , Rfl:    ondansetron  (ZOFRAN -ODT) 8 MG disintegrating tablet, Take 1 tablet (8 mg total) by mouth every 8 (eight) hours as needed for nausea or vomiting., Disp: 20 tablet, Rfl: 0   oxyCODONE  (OXY IR/ROXICODONE ) 5 MG immediate release tablet, Take 1 tablet (5 mg total) by mouth every 4 (four) hours as needed for moderate pain (pain score 4-6)., Disp: 20 tablet, Rfl: 0   pantoprazole  (PROTONIX ) 40 MG tablet, Take 1 tablet (40 mg total) by mouth 2 (two) times daily before a meal. (Patient taking differently: Take 40 mg by mouth QID.), Disp: 60 tablet, Rfl: 0   polyethylene glycol (MIRALAX  / GLYCOLAX ) 17 g packet, Take 17 g by mouth daily., Disp: 30 each, Rfl: 2   vitamin B-12 (VITAMIN B12) 500 MCG tablet, Take 1 tablet (500 mcg total) by mouth daily., Disp: , Rfl:     Review of Systems  Per HPI unless specifically indicated above     Objective:    BP 126/87   Pulse 79   Temp 97.7 F (36.5 C)   LMP 10/11/2014   SpO2 98%   Wt Readings from Last 3 Encounters:  10/18/23 132 lb 6.4 oz (60.1 kg)  10/08/23 130 lb 12.8 oz (59.3 kg)  09/27/23 135 lb 8 oz (61.5 kg)    Physical Exam Vitals reviewed.  Constitutional:      General: She is not in acute distress.    Appearance: She is not toxic-appearing.  HENT:     Head: Normocephalic and atraumatic.  Cardiovascular:     Rate and Rhythm: Normal rate and regular rhythm.  Pulmonary:     Effort: Pulmonary effort is normal.     Breath sounds: Normal breath sounds.  Musculoskeletal:     Cervical back: Neck supple.     Right lower leg: No edema.     Left lower leg: No edema.  Lymphadenopathy:     Cervical: No cervical adenopathy.  Skin:    General: Skin is warm and dry.      Comments: Forearm wounds are healing well and there are no signs of infection  Neurological:     Mental Status: She is alert and oriented to person, place, and time.  Psychiatric:        Behavior: Behavior normal.      \     Assessment & Plan:   Encounter Diagnoses  Name Primary?   Essential hypertension Yes   Substance use disorder    Drug-induced constipation  Tobacco use disorder    Uninsured       Pt is given application for getting linzess  at no cost.  She is encouraged to complete her portion and submit to GI to complete page 5 and fax it No changes today Pt to follow up here 6 weeks.  She is to contact office sooner prn

## 2023-10-22 NOTE — Telephone Encounter (Signed)
Please see other telephone message  

## 2023-10-22 NOTE — Telephone Encounter (Signed)
 Per Lyndal Crowder,LPN Good Morning, We can assist her with the medications? We are waiting for her tax return in order to assist her with applying for [employer]. We will need to know if the patient is requesting help with paying for the meds or do will she be receiving by her family members or friend   Attempted to reach pt to ask her if she was requesting help but no answer. Will send my chart message.

## 2023-10-22 NOTE — Telephone Encounter (Signed)
 Mariette Mitzie CROME, NP to Me (Selected Message)     10/22/23  8:36 AM I have sent movantik  12.5mg  daily, if this is too expensive, she can let me know    Reached out to pt to let her know and pt states that it is over $400. Contacted [facility] and co pay with no insurance is $441. Pt is also in the [facility] program so I have sent a message to the LPN with [facility] and asked if that program covers medications.

## 2023-10-22 NOTE — Telephone Encounter (Signed)
 Pt left voicemail in regards to wanting to know if [facility] sent in the meds from last office visit. Attempted to reach pt but no answer.

## 2023-10-23 ENCOUNTER — Telehealth: Payer: Self-pay

## 2023-10-23 ENCOUNTER — Other Ambulatory Visit (INDEPENDENT_AMBULATORY_CARE_PROVIDER_SITE_OTHER): Payer: Self-pay | Admitting: Gastroenterology

## 2023-10-23 LAB — ALKALINE PHOSPHATASE ISOENZYMES
Alkaline phosphatase (APISO): 429 U/L — ABNORMAL HIGH (ref 37–153)
Bone Isoenzymes: 19 % — ABNORMAL LOW (ref 28–66)
Intestinal Isoenzymes: 0 % — ABNORMAL LOW (ref 1–24)
Liver Isoenzymes: 61 % (ref 25–69)
Macrohepatic isoenzymes: 20 % — ABNORMAL HIGH (ref <=0)
Placental isoenzymes: 0 % (ref <=0)

## 2023-10-23 LAB — DRUG MONITOR, BASE PANEL, W/CONF, URINE
Alphahydroxyalprazolam: NEGATIVE ng/mL (ref <25)
Alphahydroxymidazolam: NEGATIVE ng/mL (ref <50)
Alphahydroxytriazolam: NEGATIVE ng/mL (ref <50)
Aminoclonazepam: NEGATIVE ng/mL (ref <25)
Benzodiazepines: POSITIVE ng/mL — AB (ref <100)
Benzoylecgonine: 646 ng/mL — ABNORMAL HIGH (ref <100)
Cocaine Metabolite: POSITIVE ng/mL — AB (ref <150)
Creatinine: 85.1 mg/dL (ref >=20.0)
Hydroxyethylflurazepam: NEGATIVE ng/mL (ref <50)
Lorazepam: NEGATIVE ng/mL (ref <50)
Nordiazepam: NEGATIVE ng/mL (ref <50)
Opiates: NEGATIVE ng/mL (ref <100)
Oxazepam: 470 ng/mL — ABNORMAL HIGH (ref <50)
Oxidant: NEGATIVE ug/mL (ref <200)
Oxycodone: NEGATIVE ng/mL (ref <100)
Temazepam: 76 ng/mL — ABNORMAL HIGH (ref <50)
pH: 6.4 (ref 4.5–9.0)

## 2023-10-23 LAB — DM TEMPLATE

## 2023-10-23 LAB — IGM: IgM, Serum: 410 mg/dL — ABNORMAL HIGH (ref 50–300)

## 2023-10-23 LAB — MITOCHONDRIAL ANTIBODIES: Mitochondrial M2 Ab, IgG: <=20 U (ref <=20.0)

## 2023-10-23 MED ORDER — LINACLOTIDE 145 MCG PO CAPS: 145.0000 ug | ORAL_CAPSULE | Freq: Every day | ORAL | 3 refills | Status: AC

## 2023-10-23 NOTE — Telephone Encounter (Signed)
 Called to follow up with client of [facility]. Reviewed recent appointments since last talking with client. She did see Gastroenterology and prescribed Linsess , medication not affordable due to no insurance. Patient assistance application was given to client from her Primary Care Provider [facility] and client has turned that in to GI office for completion by provider and to be sent to Pharmaceutical assistance program.  Reviewed upcoming appointments with client she has follow up with infectious disease tomorrow and also one with oncology. She is aware of both and her daughter will be assisting.  She reports she is going to Methadone  clinic and is going well. She has not contacted [facility] yet, states she has been busy. She wanted to call herself to see if they would see her as she was a patient there in the past. Have discussed that [facility] services had changed and they may not see her and she wants to try. She states she is also awaiting determination of Medicaid being approved or denied.  No other needs today verbalized. Client has my number to contact for any needs or questions. Reminded she may shop the [employer] as needed. She states understanding.  Will continue to follow.  Avelina JONELLE Skeen RN [employer]

## 2023-10-23 NOTE — Telephone Encounter (Signed)
 Pt brought in patient assistance forms for Linzess . Will we need to send Linzess  to pharmacy so it will be in your name? Currently it is showing Dr.Johnson since he was the one at the hospital that prescribed it. Assistance form on provider desk for signature.

## 2023-10-23 NOTE — Telephone Encounter (Signed)
 Form and recent office visit faxed to [employer]

## 2023-10-23 NOTE — Congregational Nurse Program (Signed)
See telephone note for today.

## 2023-10-23 NOTE — Telephone Encounter (Signed)
 Noted. Will send assistance form once signed

## 2023-10-24 ENCOUNTER — Inpatient Hospital Stay: Payer: Self-pay

## 2023-10-24 ENCOUNTER — Encounter: Payer: Self-pay | Admitting: Obstetrics & Gynecology

## 2023-10-24 ENCOUNTER — Ambulatory Visit (INDEPENDENT_AMBULATORY_CARE_PROVIDER_SITE_OTHER): Payer: Self-pay | Admitting: Internal Medicine

## 2023-10-24 ENCOUNTER — Inpatient Hospital Stay: Payer: Self-pay | Attending: Obstetrics & Gynecology | Admitting: Obstetrics & Gynecology

## 2023-10-24 ENCOUNTER — Encounter: Payer: Self-pay | Admitting: Internal Medicine

## 2023-10-24 ENCOUNTER — Other Ambulatory Visit: Payer: Self-pay

## 2023-10-24 VITALS — BP 128/90 | HR 81 | Temp 97.6°F | Wt 132.0 lb

## 2023-10-24 VITALS — BP 128/82 | HR 80 | Temp 98.1°F | Resp 17 | Wt 133.0 lb

## 2023-10-24 DIAGNOSIS — Z9079 Acquired absence of other genital organ(s): Secondary | ICD-10-CM | POA: Insufficient documentation

## 2023-10-24 DIAGNOSIS — R1013 Epigastric pain: Secondary | ICD-10-CM

## 2023-10-24 DIAGNOSIS — F1120 Opioid dependence, uncomplicated: Secondary | ICD-10-CM

## 2023-10-24 DIAGNOSIS — M461 Sacroiliitis, not elsewhere classified: Secondary | ICD-10-CM

## 2023-10-24 DIAGNOSIS — D071 Carcinoma in situ of vulva: Secondary | ICD-10-CM | POA: Diagnosis present

## 2023-10-24 DIAGNOSIS — M4658 Other infective spondylopathies, sacral and sacrococcygeal region: Secondary | ICD-10-CM

## 2023-10-24 DIAGNOSIS — M0000 Staphylococcal arthritis, unspecified joint: Secondary | ICD-10-CM

## 2023-10-24 DIAGNOSIS — G8929 Other chronic pain: Secondary | ICD-10-CM

## 2023-10-24 DIAGNOSIS — A498 Other bacterial infections of unspecified site: Secondary | ICD-10-CM

## 2023-10-24 NOTE — Patient Instructions (Addendum)
 Will call with biopsy results. Call back at the beginning of the year to schedule an appointment in March with Dr.Jackson-Moore

## 2023-10-24 NOTE — Progress Notes (Unsigned)
 RFV: new patient Patient ID: Jamie Evans, female   DOB: Oct 13, 1970, 54 y.o.   MRN: [id number]  HPI   Septic arthritis of sacroiliac joint  With cultures growing Serratia-pansensitive.  - CT renal stone study today no CT findings to correspond with suspected septic arthritis of right SI joint, compared to MRI 09/21/2023. - Was discharged 8/26 to complete 28-day course of Bactrim -held for now with AKI -Continue course for now while inpatient with IV cefepime  -switched to p.o. ciprofloxacin  on discharge, follow up with ID on 9/22 as scheduled   GI will treat hep C Sees = Chelsie from GI;  Outpatient Encounter Medications as of 10/24/2023  Medication Sig   amLODipine  (NORVASC ) 5 MG tablet Take 5 mg by mouth daily.   gabapentin  (NEURONTIN ) 300 MG capsule Take 1 capsule (300 mg total) by mouth 3 (three) times daily.   pantoprazole  (PROTONIX ) 40 MG tablet Take 1 tablet (40 mg total) by mouth 2 (two) times daily before a meal.   sucralfate  (CARAFATE ) 1 g tablet Take 1 g by mouth 4 (four) times daily.   amitriptyline  (ELAVIL ) 25 MG tablet Take 1 tablet (25 mg total) by mouth at bedtime.   amLODipine  (NORVASC ) 10 MG tablet Take 1 tablet (10 mg total) by mouth daily. (Patient not taking: Reported on 10/24/2023)   diazepam  (VALIUM ) 5 MG tablet Take 1 tablet (5 mg total) by mouth every 8 (eight) hours.   linaclotide  (LINZESS ) 145 MCG CAPS capsule Take 1 capsule (145 mcg total) by mouth daily before breakfast.   methadone  (DOLOPHINE ) 10 MG/ML solution Take 105 mg by mouth daily. [facility]-[phone] speak with Angie or Suzy. (Patient taking differently: Take 120 mg by mouth daily. [facility]-[phone] speak with Angie or Suzy.)   naloxegol  oxalate (MOVANTIK ) 12.5 MG TABS tablet Take 1 tablet (12.5 mg total) by mouth daily.   ondansetron  (ZOFRAN -ODT) 8 MG disintegrating tablet Take 1 tablet (8 mg total) by mouth every 8 (eight) hours as needed for  nausea or vomiting.   oxyCODONE  (OXY IR/ROXICODONE ) 5 MG immediate release tablet Take 1 tablet (5 mg total) by mouth every 4 (four) hours as needed for moderate pain (pain score 4-6).   polyethylene glycol (MIRALAX  / GLYCOLAX ) 17 g packet Take 17 g by mouth daily.   vitamin B-12 (VITAMIN B12) 500 MCG tablet Take 1 tablet (500 mcg total) by mouth daily.   No facility-administered encounter medications on file as of 10/24/2023.     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   Trichomoniasis 11/21/2022   Elevated alkaline phosphatase level 11/16/2022   Screening cholesterol level 11/15/2022   Screening mammogram for breast cancer 11/15/2022   Screening for colorectal cancer 11/15/2022   Encounter for screening fecal occult blood testing 11/15/2022   Encounter for gynecological examination with Papanicolaou smear of cervix 11/15/2022   Routine general medical examination at a health care facility 11/15/2022   Dyspareunia in female 11/15/2022   Vaginal atrophy 11/15/2022  Vulval lesion 11/15/2022   Screening for thyroid disorder 11/15/2022   Anxiety and depression 11/15/2022   Chronic narcotic dependence (HCC) 10/31/2015   IV drug abuse 10/31/2015   Volume depletion 10/31/2015   Withdrawal from opioids (HCC) 10/31/2015   Nausea vomiting and diarrhea 10/31/2015   Abdominal pain 10/31/2015   Acute narcotic withdrawal without complication (HCC) 10/31/2015    Acute encephalopathy 10/05/2015   Hypokalemia 10/05/2015   Essential hypertension 10/05/2015   Substance abuse (HCC) 10/05/2015   UTI (urinary tract infection) 05/19/2014   Anxiety 02/11/2013     Health Maintenance Due  Topic Date Due   DTaP/Tdap/Td (1 - Tdap) Never done   Pneumococcal Vaccine: 50+ Years (1 of 2 - PCV) Never done   Hepatitis B Vaccines 19-59 Average Risk (1 of 3 - 19+ 3-dose series) Never done   Colonoscopy  Never done   Lung Cancer Screening  Never done   Zoster Vaccines- Shingrix (1 of 2) Never done   Influenza Vaccine  Never done   COVID-19 Vaccine (1 - 2024-25 season) Never done     Review of Systems  Physical Exam   BP (!) 128/90   Pulse 81   Temp 97.6 F (36.4 C) (Oral)   Wt 132 lb (59.9 kg)   LMP 10/11/2014   SpO2 96%   BMI 22.66 kg/m    No results found for: CD4TCELL No results found for: CD4TABS No results found for: HIV1RNAQUANT No results found for: HEPBSAB Lab Results  Component Value Date   LABRPR Non Reactive 10/05/2015    CBC Lab Results  Component Value Date   WBC 7.7 10/10/2023   RBC 4.05 10/10/2023   HGB 11.7 (L) 10/10/2023   HCT 35.4 (L) 10/10/2023   PLT 224 10/10/2023   MCV 87.4 10/10/2023   MCH 28.9 10/10/2023   MCHC 33.1 10/10/2023   RDW 13.9 10/10/2023   LYMPHSABS 1.8 10/08/2023   MONOABS 0.7 10/08/2023   EOSABS 0.2 10/08/2023    BMET Lab Results  Component Value Date   NA 136 10/10/2023   K 4.3 10/10/2023   CL 99 10/10/2023   CO2 24 10/10/2023   GLUCOSE 97 10/10/2023   BUN 15 10/10/2023   CREATININE 1.42 (H) 10/10/2023   CALCIUM  8.8 (L) 10/10/2023   GFRNONAA 44 (L) 10/10/2023   GFRAA >60 11/02/2015      Assessment and Plan  Serratia si joint = check labs  Chronic pain = on methadone  130mg  dialy  Epigastric pain = has egd coming up  Awaiting medicaid coverage approved - then get mri in 2 wk

## 2023-10-24 NOTE — Progress Notes (Signed)
 Follow Up Note: Gyn-Onc  Jamie Evans 53 y.o. female  CC: Surveillance   HPI: S/P a partial simple vulvectomy in 12/24 for VIN 3 w/involvement of the surgical margins.  Interval History: Discussed the use of AI scribe software for clinical note transcription with the patient, who gave verbal consent to proceed. No new lesions or itching since her last visit.  She is currently a smoker and has been working on quitting.    Review of Systems  Review of Systems  Constitutional:  Negative for malaise/fatigue and weight loss.  Respiratory:  Negative for shortness of breath and wheezing.   Cardiovascular:  Negative for chest pain and leg swelling.  Gastrointestinal:  Negative for abdominal pain, blood in stool, constipation, nausea and vomiting.  Genitourinary:  Negative for dysuria, frequency, hematuria and urgency.  Musculoskeletal:  Negative for joint pain and myalgias.  Neurological:  Negative for weakness.  Psychiatric/Behavioral:  Negative for depression. The patient does not have insomnia.    Current medications, allergy, social history, past surgical history, past medical history, family history were all reviewed.    Vitals:  BP 128/82 (BP Location: Left Arm, Patient Position: Sitting)   Pulse 80   Temp 98.1 F (36.7 C) (Oral)   Resp 17   Wt 133 lb (60.3 kg)   LMP 10/11/2014   SpO2 100%   BMI 22.83 kg/m    Physical Exam:  Physical Exam Exam conducted with a chaperone present.  Constitutional:      General: She is not in acute distress. Cardiovascular:     Rate and Rhythm: Normal rate and regular rhythm.  Pulmonary:     Effort: Pulmonary effort is normal.     Breath sounds: Normal breath sounds. No wheezing or rhonchi.  Abdominal:     Palpations: Abdomen is soft.     Tenderness: There is no abdominal tenderness. There is no right CVA tenderness or left CVA tenderness.     Hernia: No hernia is present.  Genitourinary:    See the colposcopy  note Musculoskeletal:     Cervical back: Neck supple.     Right lower leg: No edema.     Left lower leg: No edema.  Lymphadenopathy:     Upper Body:     Right upper body: No supraclavicular adenopathy.     Left upper body: No supraclavicular adenopathy.     Lower Body: No right inguinal adenopathy. No left inguinal adenopathy.  Skin:    Findings: No rash.  Neurological:     Mental Status: She is oriented to person, place, and time.   Colposcopy Procedure Note  Indications: see above  Procedure Details  The risks and benefits of the procedure and Written informed consent obtained.  A time-out was performed confirming the patient, procedure and allergy status  Speculum placed in vagina and excellent visualization of cervix achieved, cervix swabbed x 3 with acetic acid  solution.  Findings:  Vulvar colposcopy: acetic acid  applied o vulvar tissue and skin prepped with Betadine and local 1% lidocaine .   Physical Exam Genitourinary:     Comments: Absent left labia minora.  ACW, mild with discrete borders.  3 mm punch bx taken at 4 o'clock. Silver nitrate was applied to the ellipse. Adequate hemostasis was noted.  Post bx instructions provided.     Specimens: left labial majora biopsy  Complications: none.  Impression: Low grade  Plan: Specimens labelled and sent to Pathology. Will base further treatment on Pathology findings.  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    - Review the histology from the bx - Continue close surveillance; return in 6 mos -  We discussed signs and symptoms that should prompt a phone call before her next scheduled visit.    I personally spent 25 minutes face-to-face and non-face-to-face in the care of this patient, which includes all pre, intra, and post visit time on the date of service.    [place],  MD

## 2023-10-24 NOTE — Assessment & Plan Note (Addendum)
 Jamie Evans is a 53 y.o. 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    - Review the histology from the bx - Continue close surveillance; return in 6 mos -  We discussed signs and symptoms that should prompt a phone call before her next scheduled visit.

## 2023-10-25 ENCOUNTER — Other Ambulatory Visit (INDEPENDENT_AMBULATORY_CARE_PROVIDER_SITE_OTHER): Payer: Self-pay | Admitting: Gastroenterology

## 2023-10-25 ENCOUNTER — Telehealth (INDEPENDENT_AMBULATORY_CARE_PROVIDER_SITE_OTHER): Payer: Self-pay | Admitting: Gastroenterology

## 2023-10-25 ENCOUNTER — Ambulatory Visit (INDEPENDENT_AMBULATORY_CARE_PROVIDER_SITE_OTHER): Payer: Self-pay | Admitting: Gastroenterology

## 2023-10-25 DIAGNOSIS — R748 Abnormal levels of other serum enzymes: Secondary | ICD-10-CM

## 2023-10-25 DIAGNOSIS — B182 Chronic viral hepatitis C: Secondary | ICD-10-CM

## 2023-10-25 DIAGNOSIS — F1410 Cocaine abuse, uncomplicated: Secondary | ICD-10-CM

## 2023-10-25 DIAGNOSIS — R768 Other specified abnormal immunological findings in serum: Secondary | ICD-10-CM

## 2023-10-25 LAB — BASIC METABOLIC PANEL WITH GFR
BUN/Creatinine Ratio: 32 (calc) — ABNORMAL HIGH (ref 6–22)
BUN: 30 mg/dL — ABNORMAL HIGH (ref 7–25)
CO2: 32 mmol/L (ref 20–32)
Calcium: 9.4 mg/dL (ref 8.6–10.4)
Chloride: 98 mmol/L (ref 98–110)
Creat: 0.94 mg/dL (ref 0.50–1.03)
Glucose, Bld: 77 mg/dL (ref 65–99)
Potassium: 4.6 mmol/L (ref 3.5–5.3)
Sodium: 137 mmol/L (ref 135–146)
eGFR: 73 mL/min/1.73m2 (ref >=60)

## 2023-10-25 LAB — CBC WITH DIFFERENTIAL/PLATELET
Absolute Lymphocytes: 1949 {cells}/uL (ref 850–3900)
Absolute Monocytes: 502 {cells}/uL (ref 200–950)
Basophils Absolute: 63 {cells}/uL (ref 0–200)
Basophils Relative: 1.1 %
Eosinophils Absolute: 251 {cells}/uL (ref 15–500)
Eosinophils Relative: 4.4 %
HCT: 37.5 % (ref 35.0–45.0)
Hemoglobin: 12.2 g/dL (ref 11.7–15.5)
MCH: 28.6 pg (ref 27.0–33.0)
MCHC: 32.5 g/dL (ref 32.0–36.0)
MCV: 88 fL (ref 80.0–100.0)
MPV: 9.6 fL (ref 7.5–12.5)
Monocytes Relative: 8.8 %
Neutro Abs: 2936 {cells}/uL (ref 1500–7800)
Neutrophils Relative %: 51.5 %
Platelets: 262 Thousand/uL (ref 140–400)
RBC: 4.26 Million/uL (ref 3.80–5.10)
RDW: 14.4 % (ref 11.0–15.0)
Total Lymphocyte: 34.2 %
WBC: 5.7 Thousand/uL (ref 3.8–10.8)

## 2023-10-25 LAB — SEDIMENTATION RATE: Sed Rate: 65 mm/h — ABNORMAL HIGH (ref 0–30)

## 2023-10-25 LAB — C-REACTIVE PROTEIN: CRP: 8.2 mg/L — ABNORMAL HIGH (ref <8.0)

## 2023-10-25 NOTE — Telephone Encounter (Signed)
 Per Chelsea-Can we put her in the reminder folder to do another UDS in about 2 weeks?  Pt due around 11/08/23. I will be out of the office that week. Would you mind taking care of this for me please? Thank you!

## 2023-10-25 NOTE — Telephone Encounter (Signed)
 Went ahead and ordered/printed labs. Will mail to pt with note to have done around 11/08/23

## 2023-10-25 NOTE — Progress Notes (Signed)
 Canceled patients ultrasound and scheduled patient for MRCP on 11/01/2023 at 9:30. Patient aware and verbalized understanding.

## 2023-10-25 NOTE — Telephone Encounter (Signed)
 Per Glenys she has addressed.

## 2023-10-29 NOTE — Telephone Encounter (Signed)
 Pt called in and wanted to make sure Chelsea received the paperwork and that it was faxed. Informed pt that form was completed and signed and faxed on 10/23/23 at about 2:30pm. Advised pt I would continue to keep an eye on it and I would let her know once I receive any information.

## 2023-10-30 ENCOUNTER — Ambulatory Visit (HOSPITAL_COMMUNITY): Payer: Self-pay

## 2023-10-30 LAB — SURGICAL PATHOLOGY

## 2023-10-31 ENCOUNTER — Telehealth: Payer: Self-pay | Admitting: *Deleted

## 2023-10-31 NOTE — Telephone Encounter (Signed)
-----   Message from Jamie Evans sent at 10/31/2023  9:52 AM EDT ----- Please let her know her vulva biopsy from recent visit with Dr. 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 We typically follow this with no intervention needed at this time. Dr. JERELD would recommend follow up as previously discussed for six months from now.

## 2023-10-31 NOTE — Telephone Encounter (Signed)
 Spoke with Jamie Evans and relayed message from Eleanor Epps, NP that patient's vulva biopsy with Dr. JERELD is showing changes suggestive for low grade dysplasia. We typically follow this with no intervention needed at this time. Dr. JERELD would recommend follow up as previously discussed for six months from now.   Pt verbalized understanding and patient is scheduled with Dr.Tucker for follow up on 04/25/24 at 1pm. Pt thanked the office for calling

## 2023-11-01 ENCOUNTER — Ambulatory Visit (HOSPITAL_COMMUNITY)
Admission: RE | Admit: 2023-11-01 | Discharge: 2023-11-01 | Disposition: A | Discharge status: Home / Self Care | Payer: Self-pay | Source: Ambulatory Visit | Attending: Gastroenterology | Admitting: Gastroenterology

## 2023-11-01 ENCOUNTER — Ambulatory Visit (INDEPENDENT_AMBULATORY_CARE_PROVIDER_SITE_OTHER): Payer: Self-pay | Admitting: Gastroenterology

## 2023-11-01 ENCOUNTER — Other Ambulatory Visit (INDEPENDENT_AMBULATORY_CARE_PROVIDER_SITE_OTHER): Payer: Self-pay | Admitting: Gastroenterology

## 2023-11-01 DIAGNOSIS — B182 Chronic viral hepatitis C: Secondary | ICD-10-CM | POA: Diagnosis present

## 2023-11-01 DIAGNOSIS — R7689 Other specified abnormal immunological findings in serum: Secondary | ICD-10-CM

## 2023-11-01 DIAGNOSIS — R748 Abnormal levels of other serum enzymes: Secondary | ICD-10-CM

## 2023-11-01 MED ORDER — GADOBUTROL 1 MMOL/ML IV SOLN
7.0000 mL | Freq: Once | INTRAVENOUS | Status: AC | PRN
Start: 2023-11-01 — End: 2023-11-01
  Administered 2023-11-01: 7 mL via INTRAVENOUS

## 2023-11-07 NOTE — Telephone Encounter (Signed)
 Patient daughter [name] returning your call at [phone]

## 2023-11-08 NOTE — Telephone Encounter (Signed)
 I tried calling patient no answer after multiple rings, and no VM available.   Received a form from [employer] stating they have spoken with the patient and asked her for additional information, and once the receive the information they will continue the evaluation of assistance for the patient.

## 2023-11-14 NOTE — Progress Notes (Signed)
 Referral sent, they will contact patient with apt

## 2023-11-26 ENCOUNTER — Other Ambulatory Visit: Payer: Self-pay

## 2023-11-26 DIAGNOSIS — K838 Other specified diseases of biliary tract: Secondary | ICD-10-CM

## 2023-12-03 ENCOUNTER — Other Ambulatory Visit: Payer: Self-pay | Admitting: Physician Assistant

## 2023-12-03 ENCOUNTER — Ambulatory Visit: Admission: RE | Admit: 2023-12-03 | Source: Home / Self Care | Admitting: Gastroenterology

## 2023-12-03 ENCOUNTER — Encounter: Admission: RE | Payer: Self-pay

## 2023-12-03 DIAGNOSIS — K838 Other specified diseases of biliary tract: Secondary | ICD-10-CM

## 2023-12-03 SURGERY — ERCP, WITH INTERVENTION IF INDICATED
Anesthesia: General

## 2023-12-04 ENCOUNTER — Encounter: Payer: Self-pay | Admitting: Physician Assistant

## 2023-12-04 ENCOUNTER — Ambulatory Visit: Payer: Self-pay | Admitting: Physician Assistant

## 2023-12-04 VITALS — BP 110/75 | HR 66 | Temp 96.9°F | Ht 62.75 in | Wt 139.2 lb

## 2023-12-04 DIAGNOSIS — I10 Essential (primary) hypertension: Secondary | ICD-10-CM

## 2023-12-04 NOTE — Progress Notes (Signed)
   BP 110/75   Pulse 66   Temp (!) 96.9 F (36.1 C)   Ht 5' 2.75 (1.594 m)   Wt 139 lb 4 oz (63.2 kg)   LMP 10/11/2014   SpO2 97%   BMI 24.86 kg/m    Subjective:    Patient ID: Jamie Evans, female    DOB: 1970/09/27, 53 y.o.   MRN: [id number]  HPI: Jamie Evans is a 53 y.o. female presenting on 12/04/2023 for Hypertension   HPI  Chief Complaint  Patient presents with   Hypertension    Pt says she is doing fine.  Her ERCP scheduled for yesterday was cancelled.  She has f/u appointment with GI later this month.  Pt has medicaid now.   Relevant past medical, surgical, family and social history reviewed and updated as indicated. Interim medical history since our last visit reviewed. Allergies and medications reviewed and updated.  Review of Systems  Per HPI unless specifically indicated above     Objective:    BP 110/75   Pulse 66   Temp (!) 96.9 F (36.1 C)   Ht 5' 2.75 (1.594 m)   Wt 139 lb 4 oz (63.2 kg)   LMP 10/11/2014   SpO2 97%   BMI 24.86 kg/m   Wt Readings from Last 3 Encounters:  12/04/23 139 lb 4 oz (63.2 kg)  10/24/23 133 lb (60.3 kg)  10/24/23 132 lb (59.9 kg)    Physical Exam Constitutional:      General: She is not in acute distress.    Appearance: She is not toxic-appearing.  HENT:     Head: Normocephalic and atraumatic.  Pulmonary:     Effort: Pulmonary effort is normal. No respiratory distress.  Neurological:     Mental Status: She is alert and oriented to person, place, and time.  Psychiatric:        Behavior: Behavior normal.           Assessment & Plan:    Encounter Diagnosis  Name Primary?   Essential hypertension Yes      Pt says she has all of her medications Discussed with pt that she is no longer eligible for [facility] since she has medicaid.  She states understanding.  She was given a list of local offices that are accepting medicaid and she is encouraged to start today to call to get appointment with new  pcp.

## 2023-12-14 ENCOUNTER — Telehealth (INDEPENDENT_AMBULATORY_CARE_PROVIDER_SITE_OTHER): Payer: Self-pay | Admitting: Gastroenterology

## 2023-12-14 NOTE — Telephone Encounter (Signed)
 FMLA papers received for patients daughter [name].  Contacted [name] to let her know that FMLA papers would need to filled out by her PCP since we do not fill out FMLA papers but I had to leave a message for her to return call

## 2023-12-17 ENCOUNTER — Encounter (INDEPENDENT_AMBULATORY_CARE_PROVIDER_SITE_OTHER): Payer: Self-pay

## 2023-12-17 NOTE — Telephone Encounter (Signed)
 Sent patient a [employer].

## 2023-12-25 ENCOUNTER — Telehealth (INDEPENDENT_AMBULATORY_CARE_PROVIDER_SITE_OTHER): Payer: Self-pay | Admitting: Gastroenterology

## 2023-12-25 ENCOUNTER — Ambulatory Visit (INDEPENDENT_AMBULATORY_CARE_PROVIDER_SITE_OTHER): Payer: Self-pay | Admitting: Gastroenterology

## 2023-12-25 NOTE — Telephone Encounter (Signed)
 Faxed patient enrollment form to [employer] on 10/23/23 11/07/23-received form from [employer] stating they are needing more information from patient. They directly contacted patient and requested needed info. Once they receive missing info, the will continue the evaluation.  Please note, if you or your patient do not send missing info within two weeks, we will close out eligibility review.   This information will be scanned in chart under Media   12/25/23 pt rescheduled appt.

## 2023-12-25 NOTE — Telephone Encounter (Signed)
 Pt appt rescheduled. FMLA forms mailed to patient since we have been unable to reach daughter [name]

## 2024-02-19 ENCOUNTER — Ambulatory Visit (INDEPENDENT_AMBULATORY_CARE_PROVIDER_SITE_OTHER): Admitting: Gastroenterology

## 2024-04-25 ENCOUNTER — Ambulatory Visit: Payer: Self-pay | Admitting: Gynecologic Oncology
